# Patient Record
Sex: Female | Born: 1960 | Race: White | Hispanic: No | Smoking: Current every day smoker
Health system: Southern US, Community
[De-identification: ages and names within clinical notes are randomized; demographics above are authoritative.]

## PROBLEM LIST (undated history)

## (undated) DIAGNOSIS — E039 Hypothyroidism, unspecified: Secondary | ICD-10-CM

## (undated) DIAGNOSIS — A6000 Herpesviral infection of urogenital system, unspecified: Secondary | ICD-10-CM

## (undated) DIAGNOSIS — F32A Depression, unspecified: Secondary | ICD-10-CM

## (undated) DIAGNOSIS — M1990 Unspecified osteoarthritis, unspecified site: Secondary | ICD-10-CM

## (undated) DIAGNOSIS — F329 Major depressive disorder, single episode, unspecified: Secondary | ICD-10-CM

## (undated) DIAGNOSIS — Z8619 Personal history of other infectious and parasitic diseases: Secondary | ICD-10-CM

## (undated) DIAGNOSIS — F419 Anxiety disorder, unspecified: Secondary | ICD-10-CM

## (undated) DIAGNOSIS — F319 Bipolar disorder, unspecified: Secondary | ICD-10-CM

## (undated) DIAGNOSIS — M549 Dorsalgia, unspecified: Secondary | ICD-10-CM

## (undated) HISTORY — DX: Major depressive disorder, single episode, unspecified: F32.9

## (undated) HISTORY — DX: Personal history of other infectious and parasitic diseases: Z86.19

## (undated) HISTORY — DX: Herpesviral infection of urogenital system, unspecified: A60.00

## (undated) HISTORY — DX: Depression, unspecified: F32.A

## (undated) HISTORY — DX: Hypothyroidism, unspecified: E03.9

## (undated) HISTORY — DX: Anxiety disorder, unspecified: F41.9

## (undated) HISTORY — DX: Bipolar disorder, unspecified: F31.9

## (undated) HISTORY — DX: Unspecified osteoarthritis, unspecified site: M19.90

---

## 1979-03-24 HISTORY — PX: TONSILLECTOMY: SUR1361

## 1992-03-23 HISTORY — PX: TUBAL LIGATION: SHX77

## 1997-06-28 ENCOUNTER — Other Ambulatory Visit: Admission: RE | Admit: 1997-06-28 | Discharge: 1997-06-28 | Payer: Self-pay | Admitting: Family Medicine

## 1998-10-29 ENCOUNTER — Other Ambulatory Visit: Admission: RE | Admit: 1998-10-29 | Discharge: 1998-10-29 | Payer: Self-pay | Admitting: Family Medicine

## 1999-04-30 ENCOUNTER — Encounter: Admission: RE | Admit: 1999-04-30 | Discharge: 1999-04-30 | Payer: Self-pay | Admitting: Family Medicine

## 1999-04-30 ENCOUNTER — Encounter: Payer: Self-pay | Admitting: Family Medicine

## 1999-06-09 ENCOUNTER — Encounter: Admission: RE | Admit: 1999-06-09 | Discharge: 1999-06-09 | Payer: Self-pay | Admitting: Family Medicine

## 1999-06-09 ENCOUNTER — Encounter: Payer: Self-pay | Admitting: Family Medicine

## 2000-03-03 ENCOUNTER — Other Ambulatory Visit: Admission: RE | Admit: 2000-03-03 | Discharge: 2000-03-03 | Payer: Self-pay | Admitting: *Deleted

## 2000-09-08 ENCOUNTER — Ambulatory Visit (HOSPITAL_BASED_OUTPATIENT_CLINIC_OR_DEPARTMENT_OTHER): Admission: RE | Admit: 2000-09-08 | Discharge: 2000-09-08 | Payer: Self-pay | Admitting: Orthopedic Surgery

## 2000-11-15 ENCOUNTER — Other Ambulatory Visit: Admission: RE | Admit: 2000-11-15 | Discharge: 2000-11-15 | Payer: Self-pay | Admitting: *Deleted

## 2001-06-21 ENCOUNTER — Other Ambulatory Visit: Admission: RE | Admit: 2001-06-21 | Discharge: 2001-06-21 | Payer: Self-pay | Admitting: *Deleted

## 2002-03-23 HISTORY — PX: COLONOSCOPY: SHX174

## 2002-08-30 ENCOUNTER — Other Ambulatory Visit: Admission: RE | Admit: 2002-08-30 | Discharge: 2002-08-30 | Payer: Self-pay | Admitting: *Deleted

## 2002-10-04 ENCOUNTER — Encounter (INDEPENDENT_AMBULATORY_CARE_PROVIDER_SITE_OTHER): Payer: Self-pay

## 2002-10-04 ENCOUNTER — Ambulatory Visit (HOSPITAL_COMMUNITY): Admission: RE | Admit: 2002-10-04 | Discharge: 2002-10-04 | Payer: Self-pay | Admitting: *Deleted

## 2004-01-30 ENCOUNTER — Other Ambulatory Visit: Admission: RE | Admit: 2004-01-30 | Discharge: 2004-01-30 | Payer: Self-pay | Admitting: *Deleted

## 2004-03-23 HISTORY — PX: HEMORRHOID SURGERY: SHX153

## 2005-02-24 ENCOUNTER — Other Ambulatory Visit: Admission: RE | Admit: 2005-02-24 | Discharge: 2005-02-24 | Payer: Self-pay | Admitting: *Deleted

## 2006-02-25 ENCOUNTER — Other Ambulatory Visit: Admission: RE | Admit: 2006-02-25 | Discharge: 2006-02-25 | Payer: Self-pay | Admitting: *Deleted

## 2007-03-08 ENCOUNTER — Other Ambulatory Visit: Admission: RE | Admit: 2007-03-08 | Discharge: 2007-03-08 | Payer: Self-pay | Admitting: Internal Medicine

## 2007-04-07 ENCOUNTER — Encounter: Admission: RE | Admit: 2007-04-07 | Discharge: 2007-04-07 | Payer: Self-pay | Admitting: General Surgery

## 2007-04-11 ENCOUNTER — Encounter (INDEPENDENT_AMBULATORY_CARE_PROVIDER_SITE_OTHER): Payer: Self-pay | Admitting: General Surgery

## 2007-04-11 ENCOUNTER — Ambulatory Visit (HOSPITAL_BASED_OUTPATIENT_CLINIC_OR_DEPARTMENT_OTHER): Admission: RE | Admit: 2007-04-11 | Discharge: 2007-04-11 | Payer: Self-pay | Admitting: General Surgery

## 2007-11-11 ENCOUNTER — Emergency Department (HOSPITAL_COMMUNITY): Admission: EM | Admit: 2007-11-11 | Discharge: 2007-11-11 | Payer: Self-pay | Admitting: Emergency Medicine

## 2008-03-23 LAB — HM COLONOSCOPY

## 2008-06-12 ENCOUNTER — Other Ambulatory Visit: Admission: RE | Admit: 2008-06-12 | Discharge: 2008-06-12 | Payer: Self-pay | Admitting: Internal Medicine

## 2009-03-28 ENCOUNTER — Other Ambulatory Visit: Admission: RE | Admit: 2009-03-28 | Discharge: 2009-03-28 | Payer: Self-pay | Admitting: Obstetrics and Gynecology

## 2009-09-26 ENCOUNTER — Other Ambulatory Visit: Admission: RE | Admit: 2009-09-26 | Discharge: 2009-09-26 | Payer: Self-pay | Admitting: Obstetrics and Gynecology

## 2010-02-20 ENCOUNTER — Ambulatory Visit (HOSPITAL_COMMUNITY)
Admission: RE | Admit: 2010-02-20 | Discharge: 2010-02-20 | Payer: Self-pay | Source: Home / Self Care | Admitting: Gastroenterology

## 2010-03-23 LAB — HM MAMMOGRAPHY

## 2010-05-14 ENCOUNTER — Other Ambulatory Visit (HOSPITAL_COMMUNITY)
Admission: RE | Admit: 2010-05-14 | Discharge: 2010-05-14 | Disposition: A | Discharge status: Home / Self Care | Payer: 59 | Source: Ambulatory Visit | Attending: Obstetrics and Gynecology | Admitting: Obstetrics and Gynecology

## 2010-05-14 ENCOUNTER — Other Ambulatory Visit: Payer: Self-pay | Admitting: Obstetrics and Gynecology

## 2010-05-14 DIAGNOSIS — Z124 Encounter for screening for malignant neoplasm of cervix: Secondary | ICD-10-CM | POA: Insufficient documentation

## 2010-08-05 NOTE — Op Note (Signed)
**Note Lori via Obfuscation** Thomas, [name]              ACCOUNT NO.:  [id number]   MEDICAL RECORD NO.:  [id number]          PATIENT TYPE:  AMB   LOCATION:  DSC                          FACILITY:  [facility]   PHYSICIAN:  Cherylynn Ridges, M.D.    DATE OF BIRTH:  1960/12/12   DATE OF PROCEDURE:  04/11/2007  DATE OF DISCHARGE:                               OPERATIVE REPORT   PREOP DIAGNOSIS:  Internal and external hemorrhoids with possible  fissure.   POSTOP DIAGNOSIS:  Circumferential internal and external hemorrhoids.  No fissure.   PROCEDURES:  1. Rigid sigmoidoscopy.  2. Anorectal examination under anesthesia.  3. Internal and external hemorrhoidectomy x2.   SURGEON:  Dr. Lindie Spruce.   ANESTHESIA:  General with a laryngeal airway.   ESTIMATED BLOOD LOSS:  Less than 100 mL.   COMPLICATIONS:  None.   CONDITION:  Stable.   FINDINGS:  The patient had circumferential hemorrhoids, worse at the 7  o'clock and 4 o'clock positions.  Those were the ones removed.   INDICATIONS FOR OPERATION:  The patient is a 50 year old patient that I  have been treating for several months with office procedures for  internal and external hemorrhoids and now comes in for exam under  anesthesia and hemorrhoidectomy.   FINDINGS AT OPERATION:  The patient was taken to the operating room,  placed on the table in the supine position.  After an adequate general  laryngeal airway anesthetic was administered she was placed in the  lithotomy position and prepped and draped in the usual sterile manner  exposing the anorectal area.   Prior to the prep with Betadine the patient had a rigid sigmoidoscopy  done up to approximately 21 cm.  This demonstrated no anorectal polyps,  no sigmoid colon or rectal polyps, no strictures and no areas of  bleeding.  Once she was prepped and draped we went ahead did an exam  under anesthesia digitally and also with the anoscope.  The anal digital  exam did not demonstrate any actual scarring indicative  of fissures  either anterior or posteriorly.  With the anal speculum in place we  could see that the patient had hemorrhoidal disease, worse at the 4  o'clock and 7 o'clock position.  We initially took off the one at the  seven o'clock position with a basal stitch at the base of the hemorrhoid  of 3-0 chromic.  We excised the hemorrhoid using a 15 blade and then  established hemostasis with electrocautery. We then closed off the  mucosal edge using a running locking stitch of 3-0 chromic.   The hemorrhoid at the 4 o'clock position was removed in a similar  manner.  We did reinforce the hemorrhoidal suture line at the 4 o'clock  position with 12 o'clock being directly anterior using a figure-of-eight  stitch of 3-0 chromic.  No other hemorrhoids were removed, although she  did have significant hemorrhoidal disease at appropriately the 11  o'clock position.  We did not remove this at this time for fear of  causing significant strictures.  All hemorrhoids were excised external  to the sphincter.  You  could see the sphincter muscle left intact.   Once this was done we irrigated with saline.  We injected 10 mL of 0.25%  Marcaine with epi into the submucosal area around the area of the  hemorrhoids.  Then we placed a hole sheet of 3 x 5 Gelfoam with  Dibucaine ointment smeared on it into the anorectal area and a dressing  was applied.  All counts were correct including needles, sponges and  instruments.      Cherylynn Ridges, M.D.  Electronically Signed     JOW/MEDQ  D:  04/11/2007  T:  04/11/2007  Job:  [id number]   cc:   Theressa Millard, M.D.

## 2010-08-08 NOTE — Op Note (Signed)
[facility]  Patient:    [name], [name]                       MRN: [id number] Proc. Date: 09/08/00 Attending:  Artist Pais. Mina Marble, M.D.                           Operative Report  PREOPERATIVE DIAGNOSES:  Foreign body deep, left thumb.  POSTOPERATIVE DIAGNOSES:  Foreign body deep, left thumb.  PROCEDURE:  Excision of foreign (wooden splinter), left thumb.  SURGEON:  Artist Pais. Mina Marble, M.D.  ASSISTANT:  RN  ANESTHESIA:  General.  TOURNIQUET TIME:  10 minutes.  COMPLICATIONS:  None.  DRAINS:  None.  PROCEDURE:  Patient taken to the operating room after induction of adequate general anesthesia.  Left upper extremity was prepped and draped in the usual sterile fashion.  Esmarch was used to exsanguinate the limb.  Tourniquet was inflated to 250 mmHg at this time.  The left thumb was approached.  The nail plate was elevated off the nail bed along the ulnar border and one-third of the nail plate was elevated off the nail bed.  At this point in time incision was made in the nail bed.  Dissection was carried down to the area of the distal phalanx were a 2 mm x 8 mm wooden splinter was removed.  The wound was then thoroughly irrigated.  There was no evidence of further wooden splinters in this tract.  It was carefully irrigated and the wound was gently packed with Xeroform.  The patient was then placed in a sterile dressing of 4 x 4, Coban, and a volar splint.  Patient tolerated procedure well.  Went to recovery room in stable fashion. DD:  09/09/00 TD:  09/09/00 Job: [id number]

## 2010-12-11 LAB — DIFFERENTIAL
Eosinophils Relative: 3
Lymphocytes Relative: 38
Lymphs Abs: 2.6
Monocytes Absolute: 0.6
Neutro Abs: 3.5

## 2010-12-11 LAB — COMPREHENSIVE METABOLIC PANEL
Alkaline Phosphatase: 91
BUN: 10
Chloride: 109
Glucose, Bld: 77
Potassium: 4.1
Total Bilirubin: 0.5

## 2010-12-11 LAB — CBC
HCT: 36.5
Hemoglobin: 12.2
WBC: 6.8

## 2010-12-11 LAB — POCT HEMOGLOBIN-HEMACUE: Hemoglobin: 13.4

## 2011-01-12 ENCOUNTER — Emergency Department (HOSPITAL_COMMUNITY)
Admission: EM | Admit: 2011-01-12 | Discharge: 2011-01-14 | Disposition: A | Discharge status: Home / Self Care | Payer: Self-pay | Attending: Emergency Medicine | Admitting: Emergency Medicine

## 2011-01-12 DIAGNOSIS — F3130 Bipolar disorder, current episode depressed, mild or moderate severity, unspecified: Secondary | ICD-10-CM | POA: Insufficient documentation

## 2011-01-12 DIAGNOSIS — T43502A Poisoning by unspecified antipsychotics and neuroleptics, intentional self-harm, initial encounter: Secondary | ICD-10-CM | POA: Insufficient documentation

## 2011-01-12 DIAGNOSIS — T438X2A Poisoning by other psychotropic drugs, intentional self-harm, initial encounter: Secondary | ICD-10-CM | POA: Insufficient documentation

## 2011-01-12 DIAGNOSIS — T424X4A Poisoning by benzodiazepines, undetermined, initial encounter: Secondary | ICD-10-CM | POA: Insufficient documentation

## 2011-01-12 LAB — COMPREHENSIVE METABOLIC PANEL
ALT: 9 U/L (ref 0–35)
AST: 14 U/L (ref 0–37)
Albumin: 3.7 g/dL (ref 3.5–5.2)
Alkaline Phosphatase: 122 U/L — ABNORMAL HIGH (ref 39–117)
BUN: 20 mg/dL (ref 6–23)
Chloride: 103 mEq/L (ref 96–112)
Potassium: 3.4 mEq/L — ABNORMAL LOW (ref 3.5–5.1)
Sodium: 137 mEq/L (ref 135–145)
Total Bilirubin: 0.2 mg/dL — ABNORMAL LOW (ref 0.3–1.2)
Total Protein: 7.2 g/dL (ref 6.0–8.3)

## 2011-01-12 LAB — RAPID URINE DRUG SCREEN, HOSP PERFORMED
Amphetamines: NOT DETECTED
Barbiturates: NOT DETECTED
Benzodiazepines: POSITIVE — AB
Cocaine: NOT DETECTED
Opiates: NOT DETECTED
Tetrahydrocannabinol: NOT DETECTED

## 2011-01-12 LAB — CBC
MCV: 89.1 fL (ref 78.0–100.0)
Platelets: 285 10*3/uL (ref 150–400)
RBC: 4.57 MIL/uL (ref 3.87–5.11)
RDW: 13 % (ref 11.5–15.5)
WBC: 7.8 10*3/uL (ref 4.0–10.5)

## 2011-01-12 LAB — DIFFERENTIAL
Basophils Absolute: 0 10*3/uL (ref 0.0–0.1)
Eosinophils Absolute: 0.2 10*3/uL (ref 0.0–0.7)
Eosinophils Relative: 3 % (ref 0–5)
Lymphocytes Relative: 27 % (ref 12–46)
Lymphs Abs: 2.1 10*3/uL (ref 0.7–4.0)
Neutrophils Relative %: 62 % (ref 43–77)

## 2011-01-12 LAB — URINALYSIS, ROUTINE W REFLEX MICROSCOPIC
Bilirubin Urine: NEGATIVE
Ketones, ur: NEGATIVE mg/dL
Nitrite: NEGATIVE
Specific Gravity, Urine: 1.019 (ref 1.005–1.030)
Urobilinogen, UA: 0.2 mg/dL (ref 0.0–1.0)

## 2011-01-12 LAB — URINE MICROSCOPIC-ADD ON

## 2011-01-12 LAB — GLUCOSE, CAPILLARY

## 2011-01-12 LAB — ACETAMINOPHEN LEVEL: Acetaminophen (Tylenol), Serum: <15 ug/mL (ref 10–30)

## 2012-06-29 ENCOUNTER — Encounter: Payer: Self-pay | Admitting: Internal Medicine

## 2012-06-29 ENCOUNTER — Other Ambulatory Visit (INDEPENDENT_AMBULATORY_CARE_PROVIDER_SITE_OTHER): Payer: BC Managed Care – PPO

## 2012-06-29 ENCOUNTER — Ambulatory Visit (INDEPENDENT_AMBULATORY_CARE_PROVIDER_SITE_OTHER): Payer: BC Managed Care – PPO | Admitting: Internal Medicine

## 2012-06-29 VITALS — BP 90/62 | HR 73 | Temp 98.0°F | Ht 64.0 in | Wt 162.5 lb

## 2012-06-29 DIAGNOSIS — Z0000 Encounter for general adult medical examination without abnormal findings: Secondary | ICD-10-CM

## 2012-06-29 DIAGNOSIS — T782XXS Anaphylactic shock, unspecified, sequela: Secondary | ICD-10-CM

## 2012-06-29 DIAGNOSIS — Z13220 Encounter for screening for lipoid disorders: Secondary | ICD-10-CM

## 2012-06-29 DIAGNOSIS — Z131 Encounter for screening for diabetes mellitus: Secondary | ICD-10-CM

## 2012-06-29 DIAGNOSIS — Z1329 Encounter for screening for other suspected endocrine disorder: Secondary | ICD-10-CM

## 2012-06-29 DIAGNOSIS — Z130 Encounter for screening for diseases of the blood and blood-forming organs and certain disorders involving the immune mechanism: Secondary | ICD-10-CM

## 2012-06-29 DIAGNOSIS — T788XXS Other adverse effects, not elsewhere classified, sequela: Secondary | ICD-10-CM

## 2012-06-29 DIAGNOSIS — Z1239 Encounter for other screening for malignant neoplasm of breast: Secondary | ICD-10-CM

## 2012-06-29 DIAGNOSIS — A6000 Herpesviral infection of urogenital system, unspecified: Secondary | ICD-10-CM

## 2012-06-29 DIAGNOSIS — Z23 Encounter for immunization: Secondary | ICD-10-CM

## 2012-06-29 LAB — CBC
HCT: 37.8 % (ref 36.0–46.0)
Hemoglobin: 12.9 g/dL (ref 12.0–15.0)
MCHC: 34.2 g/dL (ref 30.0–36.0)
RDW: 13.3 % (ref 11.5–14.6)
WBC: 5.9 10*3/uL (ref 4.5–10.5)

## 2012-06-29 LAB — BASIC METABOLIC PANEL
Calcium: 9.5 mg/dL (ref 8.4–10.5)
Chloride: 107 mEq/L (ref 96–112)
Creatinine, Ser: 0.9 mg/dL (ref 0.4–1.2)
Sodium: 140 mEq/L (ref 135–145)

## 2012-06-29 LAB — LIPID PANEL
Cholesterol: 254 mg/dL — ABNORMAL HIGH (ref 0–200)
HDL: 28.4 mg/dL — ABNORMAL LOW (ref >39.00)
Triglycerides: 199 mg/dL — ABNORMAL HIGH (ref 0.0–149.0)

## 2012-06-29 MED ORDER — EPINEPHRINE 0.3 MG/0.3ML IJ DEVI: 0.3000 mg | Freq: Once | INTRAMUSCULAR | Status: DC

## 2012-06-29 MED ORDER — ACYCLOVIR 200 MG PO CAPS: 200.0000 mg | ORAL_CAPSULE | Freq: Every day | ORAL | Status: DC

## 2012-06-29 NOTE — Patient Instructions (Signed)

## 2012-06-29 NOTE — Addendum Note (Signed)
Addended by: Brenton Grills C on: 06/29/2012 11:18 AM   Modules accepted: Orders

## 2012-06-29 NOTE — Progress Notes (Signed)
HPI  Pt presents to the clinic today to establish care. She has not been seen by a PCP in over 2 years. She was being seen at [facility] physicians. She does need some refills today. Other than that, she has no concerns.  Flu: 12/2011 Tetanus: more than 10 years Pap: 2012 Mammogram: 2012 LMP: post menopausal Eye doctor: yearly Dentist: yearly  Past Medical History  Diagnosis Date  . Bipolar affective disorder   . Depression   . Arthritis   . Hypothyroidism   . Genital herpes   . History of chicken pox     Current Outpatient Prescriptions  Medication Sig Dispense Refill  . ALPRAZolam (XANAX) 1 MG tablet 1-2 tablets as needed three times daily      . diazepam (VALIUM) 10 MG tablet Take 10 mg by mouth every 6 (six) hours as needed for anxiety.      . Multiple Vitamins-Minerals (ADULT GUMMY PO) Take 1 tablet by mouth 2 (two) times daily.      . phentermine (ADIPEX-P) 37.5 MG tablet Take 37.5 mg by mouth daily before breakfast.      . topiramate (TOPAMAX) 200 MG tablet Take 200 mg by mouth daily.       No current facility-administered medications for this visit.    Allergies  Allergen Reactions  . Bee Venom     Family History  Problem Relation Age of Onset  . Arthritis Mother   . Cancer Neg Hx   . Diabetes Neg Hx   . Stroke Neg Hx     History   Social History  . Marital Status: Divorced    Spouse Name: N/A    Number of Children: 1  . Years of Education: 12   Occupational History  . Retired    Social History Main Topics  . Smoking status: Current Every Day Smoker -- 0.50 packs/day for 33 years  . Smokeless tobacco: Never Used  . Alcohol Use: Yes  . Drug Use: No  . Sexually Active: Yes   Other Topics Concern  . Not on file   Social History Narrative   Regular exercise-no   Caffeine Use-yes    ROS:  Constitutional: Denies fever, malaise, fatigue, headache or abrupt weight changes.  HEENT: Pt reports blurred vision. Denies eye pain, eye redness, ear pain,  ringing in the ears, wax buildup, runny nose, nasal congestion, bloody nose, or sore throat. Respiratory: Denies difficulty breathing, shortness of breath, cough or sputum production.   Cardiovascular: Denies chest pain, chest tightness, palpitations or swelling in the hands or feet.  Gastrointestinal: Pt reports constipation. Denies abdominal pain, bloating, diarrhea or blood in the stool.  GU: Denies frequency, urgency, pain with urination, blood in urine, odor or discharge. Musculoskeletal: Denies decrease in range of motion, difficulty with gait, muscle pain or joint pain and swelling.  Skin: Denies redness, rashes, lesions or ulcercations.  Neurological: Denies dizziness, difficulty with memory, difficulty with speech or problems with balance and coordination.   No other specific complaints in a complete review of systems (except as listed in HPI above).  PE:  BP 90/62  Pulse 73  Temp(Src) 98 F (36.7 C) (Oral)  Ht 5\' 4"  (1.626 m)  Wt 162 lb 8 oz (73.71 kg)  BMI 27.88 kg/m2  SpO2 97% Wt Readings from Last 3 Encounters:  06/29/12 162 lb 8 oz (73.71 kg)    General: Appears her stated age, well developed, well nourished in NAD. HEENT: Head: normal shape and size; Eyes: sclera white,  no icterus, conjunctiva pink, PERRLA and EOMs intact; Ears: Tm's gray and intact, normal light reflex; Nose: mucosa pink and moist, septum midline; Throat/Mouth: Teeth present, mucosa pink and moist, no lesions or ulcerations noted.  Neck: Normal range of motion. Neck supple, trachea midline. No massses, lumps or thyromegaly present.  Cardiovascular: Normal rate and rhythm. S1,S2 noted.  No murmur, rubs or gallops noted. No JVD or BLE edema. No carotid bruits noted. Pulmonary/Chest: Normal effort and positive vesicular breath sounds. No respiratory distress. No wheezes, rales or ronchi noted.  Abdomen: Soft and nontender. Normal bowel sounds, no bruits noted. No distention or masses noted. Liver, spleen  and kidneys non palpable. Musculoskeletal: Normal range of motion. No signs of joint swelling. No difficulty with gait.  Neurological: Alert and oriented. Cranial nerves II-XII intact. Coordination normal. +DTRs bilaterally. Psychiatric: Mood and affect normal. Behavior is normal. Judgment and thought content normal.      Assessment and Plan:  Preventative Health Maintenance:  Start a diet and exercise program Smoking Cessation counseling given approx 5 minutes, materials given Tdap given today Will obtain basic screening labs Will place order for mammogram Pt will set up pap smear with me RX for epi pen

## 2012-06-29 NOTE — Assessment & Plan Note (Signed)
Will restart acyclovir today

## 2012-06-30 ENCOUNTER — Other Ambulatory Visit: Payer: Self-pay | Admitting: Internal Medicine

## 2012-06-30 DIAGNOSIS — E785 Hyperlipidemia, unspecified: Secondary | ICD-10-CM

## 2012-06-30 MED ORDER — SIMVASTATIN 10 MG PO TABS: 10.0000 mg | ORAL_TABLET | Freq: Every day | ORAL | Status: DC

## 2012-07-04 ENCOUNTER — Encounter: Payer: Self-pay | Admitting: *Deleted

## 2012-07-15 ENCOUNTER — Other Ambulatory Visit (HOSPITAL_COMMUNITY)
Admission: RE | Admit: 2012-07-15 | Discharge: 2012-07-15 | Disposition: A | Discharge status: Home / Self Care | Payer: BC Managed Care – PPO | Source: Ambulatory Visit | Attending: Internal Medicine | Admitting: Internal Medicine

## 2012-07-15 ENCOUNTER — Encounter: Payer: Self-pay | Admitting: Internal Medicine

## 2012-07-15 ENCOUNTER — Ambulatory Visit (INDEPENDENT_AMBULATORY_CARE_PROVIDER_SITE_OTHER): Payer: BC Managed Care – PPO | Admitting: Internal Medicine

## 2012-07-15 VITALS — BP 98/68 | HR 94 | Temp 96.9°F | Wt 157.6 lb

## 2012-07-15 DIAGNOSIS — Z124 Encounter for screening for malignant neoplasm of cervix: Secondary | ICD-10-CM

## 2012-07-15 DIAGNOSIS — Z01419 Encounter for gynecological examination (general) (routine) without abnormal findings: Secondary | ICD-10-CM

## 2012-07-15 NOTE — Progress Notes (Signed)
Subjective:    Patient ID: [name], female    DOB: Nov 19, 1960, 52 y.o.   MRN: [id number]  HPI  Pt presents to the clinic today for her pap smear. She has no concerns today.  Review of Systems      Past Medical History  Diagnosis Date  . Bipolar affective disorder   . Depression   . Arthritis   . Hypothyroidism   . Genital herpes   . History of chicken pox     Current Outpatient Prescriptions  Medication Sig Dispense Refill  . acyclovir (ZOVIRAX) 200 MG capsule Take 1 capsule (200 mg total) by mouth daily.  30 capsule  2  . ALPRAZolam (XANAX) 1 MG tablet 1-2 tablets as needed three times daily      . diazepam (VALIUM) 10 MG tablet Take 10 mg by mouth every 6 (six) hours as needed for anxiety.      Marland Kitchen EPINEPHrine (EPI-PEN) 0.3 mg/0.3 mL DEVI Inject 0.3 mLs (0.3 mg total) into the muscle once.  1 Device  2  . Multiple Vitamins-Minerals (ADULT GUMMY PO) Take 1 tablet by mouth 2 (two) times daily.      . phentermine (ADIPEX-P) 37.5 MG tablet Take 37.5 mg by mouth daily before breakfast.      . simvastatin (ZOCOR) 10 MG tablet Take 1 tablet (10 mg total) by mouth at bedtime.  30 tablet  2  . topiramate (TOPAMAX) 200 MG tablet Take 200 mg by mouth daily.       No current facility-administered medications for this visit.    Allergies  Allergen Reactions  . Bee Venom     Family History  Problem Relation Age of Onset  . Arthritis Mother   . Cancer Neg Hx   . Diabetes Neg Hx   . Stroke Neg Hx     History   Social History  . Marital Status: Divorced    Spouse Name: N/A    Number of Children: 1  . Years of Education: 12   Occupational History  . Retired    Social History Main Topics  . Smoking status: Current Every Day Smoker -- 0.50 packs/day for 33 years  . Smokeless tobacco: Never Used  . Alcohol Use: Yes  . Drug Use: No  . Sexually Active: Yes   Other Topics Concern  . Not on file   Social History Narrative   Regular exercise-no   Caffeine  Use-yes     Constitutional: Denies fever, malaise, fatigue, headache or abrupt weight changes.   GU: Denies urgency, frequency, pain with urination, burning sensation, blood in urine, odor or discharge. She denies abnormal bleeding, pain with intercourse or vaginal discharge or odor.    No other specific complaints in a complete review of systems (except as listed in HPI above).  Objective:   Physical Exam  Constitutional:  Alert, oriented x 4, well developed, well nourished in no apparent distress.  Cardiovascular: Normal rate and rhythm. S1,S2 noted.  No murmur, rubs or gallops noted. No JVD or BLE edema. No carotid bruits noted. Pulmonary/Chest: Normal effort and positive vesicular breath sounds. No respiratory distress. No wheezes, rales or ronchi noted.  Genitourinary: Normal female anatomy. Uterus midline, anterior and soft. No CMT or discharge noted. Adenexa non palpable. Some tenderness noted in the left adenexal region. Breast without lumps or masses.       Assessment & Plan:   Screening for cervical cancer with routine gyn exam:  Pap smear obtained- will send off  for cytology Bimanual exam performed, no abnormal findings  RTC in 5 years for repeat pap smear

## 2012-07-15 NOTE — Patient Instructions (Signed)

## 2012-07-21 ENCOUNTER — Telehealth: Payer: Self-pay

## 2012-07-21 NOTE — Telephone Encounter (Signed)
Phone call from pt requesting her pap results. Please advise.

## 2012-07-21 NOTE — Telephone Encounter (Signed)
Her pap was normal. We can repeat in 3 years [address]

## 2012-07-21 NOTE — Telephone Encounter (Signed)
Notified pt with regina response.../lmb 

## 2012-08-16 ENCOUNTER — Encounter: Payer: Self-pay | Admitting: Internal Medicine

## 2012-08-26 ENCOUNTER — Encounter: Payer: Self-pay | Admitting: Internal Medicine

## 2012-10-12 ENCOUNTER — Telehealth: Payer: Self-pay | Admitting: *Deleted

## 2012-10-12 DIAGNOSIS — A6000 Herpesviral infection of urogenital system, unspecified: Secondary | ICD-10-CM

## 2012-10-12 MED ORDER — VARENICLINE TARTRATE 0.5 MG PO TABS: 0.5000 mg | ORAL_TABLET | Freq: Two times a day (BID) | ORAL | Status: DC

## 2012-10-12 MED ORDER — ACYCLOVIR 200 MG PO CAPS: 200.0000 mg | ORAL_CAPSULE | Freq: Every day | ORAL | Status: DC

## 2012-10-12 NOTE — Telephone Encounter (Signed)
Spoke with pt advised rx ready for pick up.

## 2012-10-12 NOTE — Telephone Encounter (Signed)
Ok to refill valtrex and RX for chantix starter kit

## 2012-10-12 NOTE — Telephone Encounter (Signed)
Pt called requesting Acyclovir refill and an Rx for Chantix.  Please advise

## 2013-01-25 ENCOUNTER — Other Ambulatory Visit: Payer: Self-pay | Admitting: Internal Medicine

## 2013-01-26 ENCOUNTER — Other Ambulatory Visit: Payer: Self-pay

## 2013-04-19 ENCOUNTER — Other Ambulatory Visit: Payer: Self-pay | Admitting: Internal Medicine

## 2013-06-15 ENCOUNTER — Other Ambulatory Visit (INDEPENDENT_AMBULATORY_CARE_PROVIDER_SITE_OTHER): Payer: BC Managed Care – PPO

## 2013-06-15 ENCOUNTER — Ambulatory Visit (INDEPENDENT_AMBULATORY_CARE_PROVIDER_SITE_OTHER): Payer: BC Managed Care – PPO | Admitting: Physician Assistant

## 2013-06-15 ENCOUNTER — Encounter: Payer: Self-pay | Admitting: Physician Assistant

## 2013-06-15 VITALS — BP 120/70 | HR 92 | Temp 98.5°F | Ht 65.0 in | Wt 167.2 lb

## 2013-06-15 DIAGNOSIS — F17200 Nicotine dependence, unspecified, uncomplicated: Secondary | ICD-10-CM

## 2013-06-15 DIAGNOSIS — A6000 Herpesviral infection of urogenital system, unspecified: Secondary | ICD-10-CM

## 2013-06-15 DIAGNOSIS — F319 Bipolar disorder, unspecified: Secondary | ICD-10-CM

## 2013-06-15 DIAGNOSIS — Z0000 Encounter for general adult medical examination without abnormal findings: Secondary | ICD-10-CM

## 2013-06-15 LAB — CBC WITH DIFFERENTIAL/PLATELET
BASOS PCT: 0.1 % (ref 0.0–3.0)
Basophils Absolute: 0 10*3/uL (ref 0.0–0.1)
EOS PCT: 2 % (ref 0.0–5.0)
Eosinophils Absolute: 0.2 10*3/uL (ref 0.0–0.7)
HCT: 33.8 % — ABNORMAL LOW (ref 36.0–46.0)
Hemoglobin: 11.4 g/dL — ABNORMAL LOW (ref 12.0–15.0)
Lymphocytes Relative: 24.5 % (ref 12.0–46.0)
Lymphs Abs: 2 10*3/uL (ref 0.7–4.0)
MCHC: 33.9 g/dL (ref 30.0–36.0)
MCV: 96.3 fl (ref 78.0–100.0)
MONO ABS: 0.8 10*3/uL (ref 0.1–1.0)
Monocytes Relative: 10.4 % (ref 3.0–12.0)
Neutro Abs: 5 10*3/uL (ref 1.4–7.7)
Neutrophils Relative %: 63 % (ref 43.0–77.0)
Platelets: 208 10*3/uL (ref 150.0–400.0)
RBC: 3.51 Mil/uL — AB (ref 3.87–5.11)
RDW: 13.6 % (ref 11.5–14.6)
WBC: 8 10*3/uL (ref 4.5–10.5)

## 2013-06-15 LAB — LIPID PANEL
Cholesterol: 214 mg/dL — ABNORMAL HIGH (ref 0–200)
HDL: 39.1 mg/dL (ref >39.00)
LDL Cholesterol: 133 mg/dL — ABNORMAL HIGH (ref 0–99)
TRIGLYCERIDES: 212 mg/dL — AB (ref 0.0–149.0)
Total CHOL/HDL Ratio: 5
VLDL: 42.4 mg/dL — ABNORMAL HIGH (ref 0.0–40.0)

## 2013-06-15 LAB — HEPATIC FUNCTION PANEL
ALT: 11 U/L (ref 0–35)
AST: 15 U/L (ref 0–37)
Albumin: 3.5 g/dL (ref 3.5–5.2)
Alkaline Phosphatase: 53 U/L (ref 39–117)
BILIRUBIN TOTAL: 0.3 mg/dL (ref 0.3–1.2)
Bilirubin, Direct: 0 mg/dL (ref 0.0–0.3)
Total Protein: 6.1 g/dL (ref 6.0–8.3)

## 2013-06-15 LAB — BASIC METABOLIC PANEL
BUN: 13 mg/dL (ref 6–23)
CALCIUM: 9.2 mg/dL (ref 8.4–10.5)
CO2: 29 meq/L (ref 19–32)
Chloride: 104 mEq/L (ref 96–112)
Creatinine, Ser: 0.8 mg/dL (ref 0.4–1.2)
GFR: 85.46 mL/min (ref >60.00)
GLUCOSE: 93 mg/dL (ref 70–99)
POTASSIUM: 3.8 meq/L (ref 3.5–5.1)
Sodium: 140 mEq/L (ref 135–145)

## 2013-06-15 LAB — URINALYSIS, ROUTINE W REFLEX MICROSCOPIC
Bilirubin Urine: NEGATIVE
Ketones, ur: NEGATIVE
Nitrite: NEGATIVE
SPECIFIC GRAVITY, URINE: 1.01 (ref 1.000–1.030)
Total Protein, Urine: NEGATIVE
URINE GLUCOSE: NEGATIVE
Urobilinogen, UA: 0.2 (ref 0.0–1.0)
pH: 7 (ref 5.0–8.0)

## 2013-06-15 LAB — TSH: TSH: 9.8 u[IU]/mL — ABNORMAL HIGH (ref 0.35–5.50)

## 2013-06-15 NOTE — Progress Notes (Signed)
Pre visit review using our clinic review tool, if applicable. No additional management support is needed unless otherwise documented below in the visit note. 

## 2013-06-15 NOTE — Patient Instructions (Addendum)
It was great to meet you today [name]!   I have placed a referral to the gynecologist as you requested. The patient care coordinator will contact you to help schedule this.  Health Maintenance, Female A healthy lifestyle and preventative care can promote health and wellness.  Maintain regular health, dental, and eye exams.  Eat a healthy diet. Foods like vegetables, fruits, whole grains, low-fat dairy products, and lean protein foods contain the nutrients you need without too many calories. Decrease your intake of foods high in solid fats, added sugars, and salt. Get information about a proper diet from your caregiver, if necessary.  Regular physical exercise is one of the most important things you can do for your health. Most adults should get at least 150 minutes of moderate-intensity exercise (any activity that increases your heart rate and causes you to sweat) each week. In addition, most adults need muscle-strengthening exercises on 2 or more days a week.   Maintain a healthy weight. The body mass index (BMI) is a screening tool to identify possible weight problems. It provides an estimate of body fat based on height and weight. Your caregiver can help determine your BMI, and can help you achieve or maintain a healthy weight. For adults 20 years and older:  A BMI below 18.5 is considered underweight.  A BMI of 18.5 to 24.9 is normal.  A BMI of 25 to 29.9 is considered overweight.  A BMI of 30 and above is considered obese.  Maintain normal blood lipids and cholesterol by exercising and minimizing your intake of saturated fat. Eat a balanced diet with plenty of fruits and vegetables. Blood tests for lipids and cholesterol should begin at age 30 and be repeated every 5 years. If your lipid or cholesterol levels are high, you are over 50, or you are a high risk for heart disease, you may need your cholesterol levels checked more frequently.Ongoing high lipid and cholesterol levels  should be treated with medicines if diet and exercise are not effective.  If you smoke, find out from your caregiver how to quit. If you do not use tobacco, do not start.  Lung cancer screening is recommended for adults aged 73 80 years who are at high risk for developing lung cancer because of a history of smoking. Yearly low-dose computed tomography (CT) is recommended for people who have at least a 30-pack-year history of smoking and are a current smoker or have quit within the past 15 years. A pack year of smoking is smoking an average of 1 pack of cigarettes a day for 1 year (for example: 1 pack a day for 30 years or 2 packs a day for 15 years). Yearly screening should continue until the smoker has stopped smoking for at least 15 years. Yearly screening should also be stopped for people who develop a health problem that would prevent them from having lung cancer treatment.  If you are pregnant, do not drink alcohol. If you are breastfeeding, be very cautious about drinking alcohol. If you are not pregnant and choose to drink alcohol, do not exceed 1 drink per day. One drink is considered to be 12 ounces (355 mL) of beer, 5 ounces (148 mL) of wine, or 1.5 ounces (44 mL) of liquor.  Avoid use of street drugs. Do not share needles with anyone. Ask for help if you need support or instructions about stopping the use of drugs.  High blood pressure causes heart disease and increases the risk of stroke. Blood  pressure should be checked at least every 1 to 2 years. Ongoing high blood pressure should be treated with medicines, if weight loss and exercise are not effective.  If you are 26 to 55 years old, ask your caregiver if you should take aspirin to prevent strokes.  Diabetes screening involves taking a blood sample to check your fasting blood sugar level. This should be done once every 3 years, after age 49, if you are within normal weight and without risk factors for diabetes. Testing should be  considered at a younger age or be carried out more frequently if you are overweight and have at least 1 risk factor for diabetes.  Breast cancer screening is essential preventative care for women. You should practice "breast self-awareness." This means understanding the normal appearance and feel of your breasts and may include breast self-examination. Any changes detected, no matter how small, should be reported to a caregiver. Women in their 14s and 30s should have a clinical breast exam (CBE) by a caregiver as part of a regular health exam every 1 to 3 years. After age 4, women should have a CBE every year. Starting at age 4, women should consider having a mammogram (breast X-ray) every year. Women who have a family history of breast cancer should talk to their caregiver about genetic screening. Women at a high risk of breast cancer should talk to their caregiver about having an MRI and a mammogram every year.  Breast cancer gene (BRCA)-related cancer risk assessment is recommended for women who have family members with BRCA-related cancers. BRCA-related cancers include breast, ovarian, tubal, and peritoneal cancers. Having family members with these cancers may be associated with an increased risk for harmful changes (mutations) in the breast cancer genes BRCA1 and BRCA2. Results of the assessment will determine the need for genetic counseling and BRCA1 and BRCA2 testing.  The Pap test is a screening test for cervical cancer. Women should have a Pap test starting at age 71. Between ages 82 and 15, Pap tests should be repeated every 2 years. Beginning at age 34, you should have a Pap test every 3 years as long as the past 3 Pap tests have been normal. If you had a hysterectomy for a problem that was not cancer or a condition that could lead to cancer, then you no longer need Pap tests. If you are between ages 38 and 23, and you have had normal Pap tests going back 10 years, you no longer need Pap tests. If  you have had past treatment for cervical cancer or a condition that could lead to cancer, you need Pap tests and screening for cancer for at least 20 years after your treatment. If Pap tests have been discontinued, risk factors (such as a new sexual partner) need to be reassessed to determine if screening should be resumed. Some women have medical problems that increase the chance of getting cervical cancer. In these cases, your caregiver may recommend more frequent screening and Pap tests.  The human papillomavirus (HPV) test is an additional test that may be used for cervical cancer screening. The HPV test looks for the virus that can cause the cell changes on the cervix. The cells collected during the Pap test can be tested for HPV. The HPV test could be used to screen women aged 3 years and older, and should be used in women of any age who have unclear Pap test results. After the age of 92, women should have HPV testing at the  same frequency as a Pap test.  Colorectal cancer can be detected and often prevented. Most routine colorectal cancer screening begins at the age of 14 and continues through age 51. However, your caregiver may recommend screening at an earlier age if you have risk factors for colon cancer. On a yearly basis, your caregiver may provide home test kits to check for hidden blood in the stool. Use of a small camera at the end of a tube, to directly examine the colon (sigmoidoscopy or colonoscopy), can detect the earliest forms of colorectal cancer. Talk to your caregiver about this at age 11, when routine screening begins. Direct examination of the colon should be repeated every 5 to 10 years through age 62, unless early forms of pre-cancerous polyps or small growths are found.  Hepatitis C blood testing is recommended for all people born from [birth year] through 1965 and any individual with known risks for hepatitis C.  Practice safe sex. Use condoms and avoid high-risk sexual practices to  reduce the spread of sexually transmitted infections (STIs). Sexually active women aged 37 and younger should be checked for Chlamydia, which is a common sexually transmitted infection. Older women with new or multiple partners should also be tested for Chlamydia. Testing for other STIs is recommended if you are sexually active and at increased risk.  Osteoporosis is a disease in which the bones lose minerals and strength with aging. This can result in serious bone fractures. The risk of osteoporosis can be identified using a bone density scan. Women ages 41 and over and women at risk for fractures or osteoporosis should discuss screening with their caregivers. Ask your caregiver whether you should be taking a calcium supplement or vitamin D to reduce the rate of osteoporosis.  Menopause can be associated with physical symptoms and risks. Hormone replacement therapy is available to decrease symptoms and risks. You should talk to your caregiver about whether hormone replacement therapy is right for you.  Use sunscreen. Apply sunscreen liberally and repeatedly throughout the day. You should seek shade when your shadow is shorter than you. Protect yourself by wearing long sleeves, pants, a wide-brimmed hat, and sunglasses year round, whenever you are outdoors.  Notify your caregiver of new moles or changes in moles, especially if there is a change in shape or color. Also notify your caregiver if a mole is larger than the size of a pencil eraser.  Stay current with your immunizations. Document Released: 09/22/2010 Document Revised: 07/04/2012 Document Reviewed: 09/22/2010 [facility] Patient Information 2014 [employer].

## 2013-06-15 NOTE — Progress Notes (Signed)
Patient ID: [name] is a 53 y.o. female DOB: [id number] MRN: [id number]     HPI:  Patient is a 53 year old female here for yearly exam. Reports no current concerns. 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 History of Genital Herpes prophylactic treatment with Zovirax 200 mg once daily. Patient reports is bipolar, follows with Dr. Toy Care who prescribes her Xanax. Has had high cholesterol in the past not currently taking medications. Reports was on thyroid medication in the past however was taken off by previous provider, would like her levels check, states she has felt tired recently and had a weight gain over last couple months. Has had multiple attempts to quit smoking, used Chantix most recently with no success. Not interested in trying now.Would like referral to GYN. States has had abnormal PAP's in the past.  Influenza: not this year Pneumonia: does not want Tetanus: 4/14 PAP: 4/14 LMP: tubiligation Mammogram: 6/14 Eye Dr. In last 8 months Dentist 1/15 Colonoscopy: 1/10   ROS: As stated in HPI. All other systems negative  Past Medical History  Diagnosis Date  . Bipolar affective disorder   . Depression   . Arthritis   . Hypothyroidism   . Genital herpes   . History of chicken pox    Family History  Problem Relation Age of Onset  . Arthritis Mother   . Cancer Neg Hx   . Diabetes Neg Hx   . Stroke Neg Hx    History   Social History  . Marital Status: Divorced    Spouse Name: N/A    Number of Children: 1  . Years of Education: 12   Occupational History  . Retired    Social History Main Topics  . Smoking status: Current Every Day Smoker -- 0.50 packs/day for 33 years  . Smokeless tobacco: Never Used  . Alcohol Use: Yes  . Drug Use: No  . Sexual Activity: Yes   Other Topics Concern  . None   Social History Narrative   Regular exercise-no   Caffeine Use-yes   Past Surgical History    Procedure Laterality Date  . Tonsillectomy  1981  . Hemorrhoid surgery  2006  . Cesarean section  1993  . Tubal ligation  1994   Current Outpatient Prescriptions on File Prior to Visit  Medication Sig Dispense Refill  . acyclovir (ZOVIRAX) 200 MG capsule TAKE ONE CAPSULE EVERY DAY  30 capsule  2  . ALPRAZolam (XANAX) 1 MG tablet 1-2 tablets as needed three times daily      . EPINEPHrine (EPI-PEN) 0.3 mg/0.3 mL DEVI Inject 0.3 mLs (0.3 mg total) into the muscle once.  1 Device  2  . Multiple Vitamins-Minerals (ADULT GUMMY PO) Take 1 tablet by mouth 2 (two) times daily.       No current facility-administered medications on file prior to visit.   Allergies  Allergen Reactions  . Bee Venom     PE:  Filed Vitals:   06/15/13 0903  BP: 120/70  Pulse: 92  Temp: 98.5 F (36.9 C)    CONSTITUTIONAL: Well developed, well nourished, pleasant, appears stated age, in NAD HEENT: normocephalic, atraumatic, bilateral ext/int canals normal. Bilateral TM's without injections, bulging, erythema. Nose normal, uvula midline, oropharynx clear and moist. Dentition fair. EYES: PERRLA, bilateral EOM and conjunctiva normal without icterus NECK: FROM, supple, without thyromegaly or mass, trachea midline CARDIO: RRR, normal S1 and S2, distal pulses intact.  No extremity edema PULM/CHEST CTA bilateral, no wheezes, rales or rhonchi. Non tender. ABD: appearance normal, soft, nontender. Normal bowel sounds x 4 quadrants, no HSM GU: deferred to GYN.  MUSC: FROM U/LE bilateral, FROM thoracic and lumbar spine, no midline tenderness.  LYMPH: no cervical, supraclavicular adenopathy NEURO: alert and oriented x 3, no cranial nerve deficit, motor strength and coordination NL. DTR's intact. Negative romberg. Gait normal. SKIN: warm, dry, no rash or lesions noted. PSYCH: Mood and affect normal, speech normal. Thought content and judgement normal.   Lab Results  Component Value Date   WBC 5.9 06/29/2012   HGB 12.9  06/29/2012   HCT 37.8 06/29/2012   PLT 267.0 06/29/2012   GLUCOSE 88 06/29/2012   CHOL 254* 06/29/2012   TRIG 199.0* 06/29/2012   HDL 28.40* 06/29/2012   LDLDIRECT 181.5 06/29/2012   ALT 9 01/12/2011   AST 14 01/12/2011   NA 140 06/29/2012   K 3.4* 06/29/2012   CL 107 06/29/2012   CREATININE 0.9 06/29/2012   BUN 9 06/29/2012   CO2 25 06/29/2012   TSH 2.30 06/29/2012   HGBA1C 4.9 06/29/2012   Wt Readings from Last 3 Encounters:  06/15/13 167 lb 3.2 oz (75.841 kg)  07/15/12 157 lb 9.6 oz (71.487 kg)  06/29/12 162 lb 8 oz (73.71 kg)     ASSESSMENT and PLAN   CPX/v70.0 - Patient has been counseled on age-appropriate routine health concerns for screening and prevention. These are reviewed and up-to-date. Immunizations are up-to-date or declined. Labs ordered and will be reviewed.  Referral to GYN Labs ordered today: CBC, BMET, HFT, TSH, Lipid, UA  Genital Herpes,  Continue with Zovirax 200 mg once daily  Smoker Not interested in quitting at this time  Hypothyroidism, history Will evaluate TSH today  Bipolar disorder Continue with current medications  Keep scheduled appointments with Psychiatry.

## 2013-06-16 ENCOUNTER — Other Ambulatory Visit: Payer: Self-pay | Admitting: Physician Assistant

## 2013-06-16 DIAGNOSIS — E039 Hypothyroidism, unspecified: Secondary | ICD-10-CM

## 2013-06-16 MED ORDER — LEVOTHYROXINE SODIUM 50 MCG PO TABS: 50.0000 ug | ORAL_TABLET | Freq: Every day | ORAL | Status: DC

## 2013-07-24 ENCOUNTER — Encounter: Payer: BC Managed Care – PPO | Admitting: Nurse Practitioner

## 2013-08-14 ENCOUNTER — Other Ambulatory Visit: Payer: Self-pay | Admitting: Internal Medicine

## 2013-08-15 NOTE — Telephone Encounter (Signed)
Will refill but will need appt before further refills given

## 2013-08-15 NOTE — Telephone Encounter (Signed)
Last filled 1/15--last OV with you was over 1 year ago--please advise

## 2013-08-25 ENCOUNTER — Emergency Department (HOSPITAL_COMMUNITY): Payer: BC Managed Care – PPO

## 2013-08-25 ENCOUNTER — Emergency Department (HOSPITAL_COMMUNITY)
Admission: EM | Admit: 2013-08-25 | Discharge: 2013-08-25 | Disposition: A | Discharge status: Home / Self Care | Payer: BC Managed Care – PPO | Attending: Emergency Medicine | Admitting: Emergency Medicine

## 2013-08-25 ENCOUNTER — Encounter (HOSPITAL_COMMUNITY): Payer: Self-pay | Admitting: Emergency Medicine

## 2013-08-25 DIAGNOSIS — S0993XA Unspecified injury of face, initial encounter: Secondary | ICD-10-CM | POA: Insufficient documentation

## 2013-08-25 DIAGNOSIS — E876 Hypokalemia: Secondary | ICD-10-CM | POA: Insufficient documentation

## 2013-08-25 DIAGNOSIS — W010XXA Fall on same level from slipping, tripping and stumbling without subsequent striking against object, initial encounter: Secondary | ICD-10-CM | POA: Insufficient documentation

## 2013-08-25 DIAGNOSIS — E039 Hypothyroidism, unspecified: Secondary | ICD-10-CM | POA: Insufficient documentation

## 2013-08-25 DIAGNOSIS — Y9289 Other specified places as the place of occurrence of the external cause: Secondary | ICD-10-CM | POA: Insufficient documentation

## 2013-08-25 DIAGNOSIS — F17200 Nicotine dependence, unspecified, uncomplicated: Secondary | ICD-10-CM | POA: Insufficient documentation

## 2013-08-25 DIAGNOSIS — Z79899 Other long term (current) drug therapy: Secondary | ICD-10-CM | POA: Insufficient documentation

## 2013-08-25 DIAGNOSIS — Z8619 Personal history of other infectious and parasitic diseases: Secondary | ICD-10-CM | POA: Insufficient documentation

## 2013-08-25 DIAGNOSIS — R42 Dizziness and giddiness: Secondary | ICD-10-CM | POA: Insufficient documentation

## 2013-08-25 DIAGNOSIS — Y9389 Activity, other specified: Secondary | ICD-10-CM | POA: Insufficient documentation

## 2013-08-25 DIAGNOSIS — F329 Major depressive disorder, single episode, unspecified: Secondary | ICD-10-CM | POA: Insufficient documentation

## 2013-08-25 DIAGNOSIS — F3289 Other specified depressive episodes: Secondary | ICD-10-CM | POA: Insufficient documentation

## 2013-08-25 DIAGNOSIS — N289 Disorder of kidney and ureter, unspecified: Secondary | ICD-10-CM | POA: Insufficient documentation

## 2013-08-25 DIAGNOSIS — Z8739 Personal history of other diseases of the musculoskeletal system and connective tissue: Secondary | ICD-10-CM | POA: Insufficient documentation

## 2013-08-25 DIAGNOSIS — S199XXA Unspecified injury of neck, initial encounter: Principal | ICD-10-CM

## 2013-08-25 LAB — BASIC METABOLIC PANEL
BUN: 34 mg/dL — ABNORMAL HIGH (ref 6–23)
CALCIUM: 9.2 mg/dL (ref 8.4–10.5)
CO2: 26 mEq/L (ref 19–32)
CREATININE: 1.9 mg/dL — AB (ref 0.50–1.10)
Chloride: 103 mEq/L (ref 96–112)
GFR, EST AFRICAN AMERICAN: 33 mL/min — AB (ref >90)
GFR, EST NON AFRICAN AMERICAN: 29 mL/min — AB (ref >90)
GLUCOSE: 82 mg/dL (ref 70–99)
Potassium: 2.9 mEq/L — CL (ref 3.7–5.3)
Sodium: 145 mEq/L (ref 137–147)

## 2013-08-25 LAB — CBC WITH DIFFERENTIAL/PLATELET
BASOS PCT: 0 % (ref 0–1)
Basophils Absolute: 0 10*3/uL (ref 0.0–0.1)
EOS PCT: 1 % (ref 0–5)
Eosinophils Absolute: 0.1 10*3/uL (ref 0.0–0.7)
HCT: 34.7 % — ABNORMAL LOW (ref 36.0–46.0)
HEMOGLOBIN: 11.5 g/dL — AB (ref 12.0–15.0)
LYMPHS ABS: 2.4 10*3/uL (ref 0.7–4.0)
Lymphocytes Relative: 41 % (ref 12–46)
MCH: 31.8 pg (ref 26.0–34.0)
MCHC: 33.1 g/dL (ref 30.0–36.0)
MCV: 95.9 fL (ref 78.0–100.0)
MONO ABS: 0.7 10*3/uL (ref 0.1–1.0)
MONOS PCT: 12 % (ref 3–12)
NEUTROS PCT: 46 % (ref 43–77)
Neutro Abs: 2.7 10*3/uL (ref 1.7–7.7)
Platelets: 128 10*3/uL — ABNORMAL LOW (ref 150–400)
RBC: 3.62 MIL/uL — AB (ref 3.87–5.11)
RDW: 13.2 % (ref 11.5–15.5)
WBC: 5.9 10*3/uL (ref 4.0–10.5)

## 2013-08-25 MED ORDER — SODIUM CHLORIDE 0.9 % IV SOLN
1000.0000 mL | Freq: Once | INTRAVENOUS | Status: AC
  Administered 2013-08-25: 1000 mL via INTRAVENOUS

## 2013-08-25 MED ORDER — POTASSIUM CHLORIDE CRYS ER 20 MEQ PO TBCR
40.0000 meq | EXTENDED_RELEASE_TABLET | Freq: Once | ORAL | Status: AC
  Administered 2013-08-25: 40 meq via ORAL
  Filled 2013-08-25: qty 2

## 2013-08-25 MED ORDER — POTASSIUM CHLORIDE ER 20 MEQ PO TBCR: 10.0000 meq | EXTENDED_RELEASE_TABLET | Freq: Two times a day (BID) | ORAL | Status: DC

## 2013-08-25 MED ORDER — SODIUM CHLORIDE 0.9 % IV SOLN
1000.0000 mL | INTRAVENOUS | Status: DC
  Administered 2013-08-25: 1000 mL via INTRAVENOUS

## 2013-08-25 NOTE — ED Notes (Signed)
Patient transported to CT 

## 2013-08-25 NOTE — ED Notes (Addendum)
Pt reports on Sunday she was going to take a bubble bath, pt slipped and fell hitting her face and chest onto side of bathtub. Pt still has bruising to right side of face, under right eye, right cheek, right jaw area. Pt reports bruises under right breast. Pt reports she has been dizzy since the fall. At work pt was told she has been acting and talking slow, and bumping into things, and must get checked by a doctor before she can return to work. Pt answers questions appropriately but is slow with her speech.

## 2013-08-25 NOTE — Discharge Instructions (Signed)
Hypokalemia Hypokalemia means that the amount of potassium in the blood is lower than normal.Potassium is a chemical, called an electrolyte, that helps regulate the amount of fluid in the body. It also stimulates muscle contraction and helps nerves function properly.Most of the body's potassium is inside of cells, and only a very small amount is in the blood. Because the amount in the blood is so small, minor changes can be life-threatening. CAUSES  Antibiotics.  Diarrhea or vomiting.  Using laxatives too much, which can cause diarrhea.  Chronic kidney disease.  Water pills (diuretics).  Eating disorders (bulimia).  Low magnesium level.  Sweating a lot. SIGNS AND SYMPTOMS  Weakness.  Constipation.  Fatigue.  Muscle cramps.  Mental confusion.  Skipped heartbeats or irregular heartbeat (palpitations).  Tingling or numbness. DIAGNOSIS  Your health care provider can diagnose hypokalemia with blood tests. In addition to checking your potassium level, your health care provider may also check other lab tests. TREATMENT Hypokalemia can be treated with potassium supplements taken by mouth or adjustments in your current medicines. If your potassium level is very low, you may need to get potassium through a vein (IV) and be monitored in the hospital. A diet high in potassium is also helpful. Foods high in potassium are:  Nuts, such as peanuts and pistachios.  Seeds, such as sunflower seeds and pumpkin seeds.  Peas, lentils, and lima beans.  Whole grain and bran cereals and breads.  Fresh fruit and vegetables, such as apricots, avocado, bananas, cantaloupe, kiwi, oranges, tomatoes, asparagus, and potatoes.  Orange and tomato juices.  Red meats.  Fruit yogurt. HOME CARE INSTRUCTIONS  Take all medicines as prescribed by your health care provider.  Maintain a healthy diet by including nutritious food, such as fruits, vegetables, nuts, whole grains, and lean meats.  If  you are taking a laxative, be sure to follow the directions on the label. SEEK MEDICAL CARE IF:  Your weakness gets worse.  You feel your heart pounding or racing.  You are vomiting or having diarrhea.  You are diabetic and having trouble keeping your blood glucose in the normal range. SEEK IMMEDIATE MEDICAL CARE IF:  You have chest pain, shortness of breath, or dizziness.  You are vomiting or having diarrhea for more than 2 days.  You faint. MAKE SURE YOU:   Understand these instructions.  Will watch your condition.  Will get help right away if you are not doing well or get worse. Document Released: 03/09/2005 Document Revised: 12/28/2012 Document Reviewed: 09/09/2012 [facility] Patient Information 2014 [place].  Kidney Disease, Adult The kidneys are two organs that lie on either side of the spine between the middle of the back and the front of the abdomen. The kidneys:   Remove wastes and extra water from the blood.   Produce important hormones. These regulate blood pressure, help keep bones strong, and help create red blood cells.   Balance the fluids and chemicals in the blood and tissues. Kidney disease occurs when the kidneys are damaged. Kidney damage may be sudden (acute) or develop over a long period (chronic). A small amount of damage may not cause problems, but a large amount of damage may make it difficult or impossible for the kidneys to work the way they should. Early detection and treatment of kidney disease may prevent kidney damage from becoming permanent or getting worse. Some kidney diseases are curable, but most are not. Many people with kidney disease are able to control the disease and live a  normal life.  TYPES OF KIDNEY DISEASE  Acute kidney injury.Acute kidney injury occurs when there is sudden damage to the kidneys.  Chronic kidney disease. Chronic kidney disease occurs when the kidneys are damaged over a long period.  End-stage kidney  disease. End-stage kidney disease occurs when the kidneys are so damaged that they stop working. In end-stage kidney disease, the kidneys cannot get better. CAUSES Any condition, disease, or event that damages the kidneys may cause kidney disease. Acute kidney injury.  A problem with blood flow to the kidneys. This may be caused by:   Blood loss.   Heart disease.   Severe burns.   Liver disease.  Direct damage to the kidneys. This may be caused by:  Some medicines.   A kidney infection.   Poisoning or consuming toxic substances.   A surgical wound.   A blow to the kidney area.   A problem with urine flow. This may be caused by:   Cancer.   Kidney stones.   An enlarged prostate. Chronic kidney disease. The most common causes of chronic kidney disease are diabetes and high blood pressure (hypertension). Chronic kidney disease may also be caused by:   Diseases that cause the filtering units of the kidneys to become inflamed.   Diseases that affect the immune system.   Genetic diseases.   Medicines that damage the kidneys, such as anti-inflammatory medicines.  Poisoning or exposure to toxic substances.   A reoccurring kidney or urinary infection.   A problem with urine flow. This may be caused by:  Cancer.   Kidney stones.   An enlarged prostate in males. End-stage kidney disease. This kidney disease usually occurs when a chronic kidney disease gets worse. It may also occur after acute kidney injury.  SYMPTOMS   Swelling (edema) of the legs, ankles, or feet.   Tiredness (lethargy).   Nausea or vomiting.   Confusion.   Problems with urination, such as:   Painful or burning feeling during urination.   Decreased urine production.  Bloody urine.   Frequent urination, especially at night.  Hypertension.  Muscle twitches and cramps.   Shortness of breath.   Persistent itchiness.   Loss of appetite.  Metallic  taste in the mouth.   Weakness.   Seizures.   Chest pain or pressure.   Trouble sleeping.   Headaches.   Abnormally dark or light skin.   Numbness in the hands or feet.   Easy bruising.   Frequent hiccups.   Menstruation stops. Sometimes, no symptoms are present. DIAGNOSIS  Kidney disease may be detected and diagnosed by tests, including blood, urine, imaging, or kidney biopsy tests.  TREATMENT  Acute kidney injury. Treatment of acute kidney injury varies depending on the cause and severity of the kidney damage. In mild cases, no treatment may be needed. The kidneys may heal on their own. If acute kidney injury is more severe, your caregiver will treat the cause of the kidney damage, help the kidneys heal, and prevent complications from occurring. Severe cases may require a procedure to remove toxic wastes from the body (dialysis) or surgery to repair kidney damage. Surgery may involve:   Repair of a torn kidney.   Removal of an obstruction.  Most of the time, you will need to stay overnight at the hospital.  Chronic kidney disease. Most chronic kidney diseases cannot be cured. Treatment usually involves relieving symptoms and preventing or slowing the progression of the disease. Treatment may include:   A special  diet. You may need to avoid alcohol and foods that:   Have added salt.   Are high in potassium.   Are high in protein.   Medicines. These may:   Lower blood pressure.   Relieve anemia.   Relieve swelling.   Protect the bones.  End-stage kidney disease. End-stage kidney disease is life-threatening and must be treated immediately. There are two treatments for end-stage kidney disease:   Dialysis.   Receiving a new kidney (kidney transplant). Both of these treatments have serious risks and consequences. In addition to having dialysis or a kidney transplant, you may need to take medicines to control hypertension and cholesterol and  to decrease phosphorus levels in your blood. LENGTH OF ILLNESS  Acute kidney injury.The length of this disease varies greatly from person to person. Exactly how long it lasts depends on the cause of the kidney damage. Acute kidney injury may develop into chronic kidney disease or end-stage kidney disease.  Chronic kidney disease. This disease usually lasts a lifetime. Chronic kidney disease may worsen over time to become end-stage kidney disease. The time it takes for end-stage kidney disease to develop varies from person to person.  End-stage kidney disease. This disease lasts until a kidney transplant is performed. PREVENTION  Kidney disease can sometimes be prevented. If you have diabetes, hypertension, or any other condition that may lead to kidney disease, you should try to prevent kidney disease with:   An appropriate diet.  Medicine.  Lifestyle changes. FOR MORE INFORMATION  American Association of Kidney Patients: BombTimer.gl  National Kidney Foundation: www.kidney.[place]: https://mathis.com/  [facility]: www.lifeoptions.org and www.kidneyschool.org  Document Released: 03/09/2005 Document Revised: 02/24/2012 Document Reviewed: 11/06/2011 [facility] Patient Information 2014 [place], [place].

## 2013-08-25 NOTE — ED Provider Notes (Signed)
CSN: [id number]     Arrival date & time 08/25/13  1603 History   First MD Initiated Contact with Patient 08/25/13 1622     Chief Complaint  Patient presents with  . Fall  . Dizziness   HPI Patient presents to emergency room with complaints of facial pain and dizziness. Patient states she fell when slipping in the bathtub either Sunday or Monday evening. Since that time she's had some episodes of feeling dizzy lightheaded and she's also had persistent pain in her right cheek. Her coworkers have noticed that her speech seems to be a bit slow. She was told to come to emergency room to get checked out. Patient denies any neck pain. She denies any vomiting. She denies any focal numbness or weakness.  Patient does take Xanax 2 mg 3 times daily. She has not changed his dose recently and has not taken any extra medications. She denies any alcohol or drug use. Past Medical History  Diagnosis Date  . Bipolar affective disorder   . Depression   . Arthritis   . Hypothyroidism   . Genital herpes   . History of chicken pox    Past Surgical History  Procedure Laterality Date  . Tonsillectomy  1981  . Hemorrhoid surgery  2006  . Cesarean section  1993  . Tubal ligation  1994   Family History  Problem Relation Age of Onset  . Arthritis Mother   . Cancer Neg Hx   . Diabetes Neg Hx   . Stroke Neg Hx    History  Substance Use Topics  . Smoking status: Current Every Day Smoker -- 0.50 packs/day for 33 years  . Smokeless tobacco: Never Used  . Alcohol Use: Yes   OB History   Grav Para Term Preterm Abortions TAB SAB Ect Mult Living                 Review of Systems  All other systems reviewed and are negative.     Allergies  Bee venom  Home Medications   Prior to Admission medications   Medication Sig Start Date End Date Taking? Authorizing Provider  acyclovir (ZOVIRAX) 200 MG capsule Take 200 mg by mouth daily.   Yes Historical Provider, MD  alprazolam Duanne Moron) 2 MG tablet Take 2 mg  by mouth 3 (three) times daily.   Yes Historical Provider, MD       Historical Provider, MD  EPINEPHrine (EPI-PEN) 0.3 mg/0.3 mL DEVI Inject 0.3 mLs (0.3 mg total) into the muscle once. 06/29/12  Yes Webb Silversmith, NP  levothyroxine (SYNTHROID, LEVOTHROID) 50 MCG tablet Take 1 tablet (50 mcg total) by mouth daily. 06/16/13  Yes Stacy Gardner, PA-C  Patient use to take Depakote but she was switched to another medication. Patient cannot recall the name of that right now.  She says she is no longer taking Depakote BP 117/59  Pulse 78  Temp(Src) 98.3 F (36.8 C) (Oral)  Resp 16  SpO2 97% Physical Exam  Nursing note and vitals reviewed. Constitutional: She is oriented to person, place, and time. She appears well-developed and well-nourished. No distress.  HENT:  Head: Normocephalic and atraumatic.  Right Ear: External ear normal.  Left Ear: External ear normal.  Mouth/Throat: Oropharynx is clear and moist.  Eyes: Conjunctivae are normal. Right eye exhibits no discharge. Left eye exhibits no discharge. No scleral icterus.  Neck: Neck supple. No tracheal deviation present.  Cardiovascular: Normal rate, regular rhythm and intact distal pulses.   Pulmonary/Chest: Effort normal  and breath sounds normal. No stridor. No respiratory distress. She has no wheezes. She has no rales.  Abdominal: Soft. Bowel sounds are normal. She exhibits no distension. There is no tenderness. There is no rebound and no guarding.  Musculoskeletal: She exhibits no edema and no tenderness.  Neurological: She is alert and oriented to person, place, and time. She has normal strength. No cranial nerve deficit (no facial droop, extraocular movements intact, no slurred speech) or sensory deficit. She exhibits normal muscle tone. She displays no seizure activity. Coordination normal.  No pronator drift bilateral upper extrem, able to hold both legs off bed for 5 seconds, sensation intact in all extremities, no visual field cuts, no  left or right sided neglect,  no nystagmus noted   Skin: Skin is warm and dry. No rash noted.  Psychiatric: She has a normal mood and affect.    ED Course  Procedures (including critical care time) Labs Review Labs Reviewed  CBC WITH DIFFERENTIAL - Abnormal; Notable for the following:    RBC 3.62 (*)    Hemoglobin 11.5 (*)    HCT 34.7 (*)    Platelets 128 (*)    All other components within normal limits  BASIC METABOLIC PANEL - Abnormal; Notable for the following:    Potassium 2.9 (*)    BUN 34 (*)    Creatinine, Ser 1.90 (*)    GFR calc non Af Amer 29 (*)    GFR calc Af Amer 33 (*)    All other components within normal limits    Imaging Review Ct Head Wo Contrast  08/25/2013   CLINICAL DATA:  Fall, dizziness, injury  EXAM: CT HEAD WITHOUT CONTRAST  CT MAXILLOFACIAL WITHOUT CONTRAST  TECHNIQUE: Multidetector CT imaging of the head and maxillofacial structures were performed using the standard protocol without intravenous contrast. Multiplanar CT image reconstructions of the maxillofacial structures were also generated.  COMPARISON:  None.  FINDINGS: CT HEAD FINDINGS  No acute intracranial hemorrhage, mass lesion, definite infarction, midline shift, herniation, or hydrocephalus. Mild brain atrophy. No focal mass effect or edema. Normal gray-white matter differentiation. Cisterns patent. No cerebellar abnormality. Mastoids and sinuses are clear.  CT MAXILLOFACIAL FINDINGS  Facial bones appear intact. No displaced facial bony fracture or trauma. Specifically, the mandible, maxilla, pterygoid plates, zygomas, nasal septum, nasal bones, orbits, and skull base all appear intact. Orbits are symmetric. No proptosis or asymmetry. Minor right facial subcutaneous bruising/swelling. Visualized upper cervical spine unremarkable. Dental hardware creates artifact. No orbital blowout fracture.  IMPRESSION: No acute intracranial finding.  No acute facial bony trauma or fracture.   Electronically Signed    By: Daryll Brod M.D.   On: 08/25/2013 17:08   Ct Maxillofacial Wo Cm  08/25/2013   CLINICAL DATA:  Fall, dizziness, injury  EXAM: CT HEAD WITHOUT CONTRAST  CT MAXILLOFACIAL WITHOUT CONTRAST  TECHNIQUE: Multidetector CT imaging of the head and maxillofacial structures were performed using the standard protocol without intravenous contrast. Multiplanar CT image reconstructions of the maxillofacial structures were also generated.  COMPARISON:  None.  FINDINGS: CT HEAD FINDINGS  No acute intracranial hemorrhage, mass lesion, definite infarction, midline shift, herniation, or hydrocephalus. Mild brain atrophy. No focal mass effect or edema. Normal gray-white matter differentiation. Cisterns patent. No cerebellar abnormality. Mastoids and sinuses are clear.  CT MAXILLOFACIAL FINDINGS  Facial bones appear intact. No displaced facial bony fracture or trauma. Specifically, the mandible, maxilla, pterygoid plates, zygomas, nasal septum, nasal bones, orbits, and skull base all appear intact. Orbits  are symmetric. No proptosis or asymmetry. Minor right facial subcutaneous bruising/swelling. Visualized upper cervical spine unremarkable. Dental hardware creates artifact. No orbital blowout fracture.  IMPRESSION: No acute intracranial finding.  No acute facial bony trauma or fracture.   Electronically Signed   By: Daryll Brod M.D.   On: 08/25/2013 17:08     EKG Interpretation None      MDM   Final diagnoses:  Hypokalemia  Acute renal insufficiency    No evidence of serious injury associated with the patient's fall. It is possible that her Xanax is contributing to some of her speech pattern noticed by her coworkers. Recommend she try decreasing her today time dosing to 1 mg  Patient's renal function is elevated compared to previous labs. She's not having with vomiting or diarrhea. She's not on any medications that should be calling issues with her renal function.  I explained to her that it is important for  her to have this rechecked by her doctor.  Pt states she is no longer on depakote, therfore depakote level not checked.  At this time there does not appear to be any evidence of an acute emergency medical condition and the patient appears stable for discharge with appropriate outpatient follow up.    Dorie Rank, MD 08/25/13 Lurline Hare

## 2013-08-25 NOTE — ED Notes (Signed)
MD at bedside. 

## 2013-08-25 NOTE — ED Notes (Signed)
Potassium 2.9 critical result called from lab. Primary RN Wendall Mola notified.

## 2013-08-29 ENCOUNTER — Encounter: Payer: Self-pay | Admitting: Nurse Practitioner

## 2013-08-29 ENCOUNTER — Encounter: Payer: BC Managed Care – PPO | Admitting: Nurse Practitioner

## 2013-08-30 ENCOUNTER — Encounter: Payer: Self-pay | Admitting: Internal Medicine

## 2013-09-05 ENCOUNTER — Telehealth: Payer: Self-pay

## 2013-09-05 NOTE — Telephone Encounter (Signed)
Pt states that she gets chronic sinus infections.  Pt has not been seen since 06/15/2013 by nancy hartman, and wants an antibiotic.  Informed the pt that she needed to schedule an appt with one of our providers to establish care, and or go to an urgent care or hospital for her sx.  Pt states that she does not have a ride, and is too ill to leave her home.  I once again let her know that she has to be assessed first before receiving medication.  Pt hung up the phone.

## 2013-09-07 ENCOUNTER — Encounter: Payer: Self-pay | Admitting: Family Medicine

## 2013-09-07 ENCOUNTER — Encounter: Payer: Self-pay | Admitting: *Deleted

## 2013-09-07 ENCOUNTER — Ambulatory Visit (INDEPENDENT_AMBULATORY_CARE_PROVIDER_SITE_OTHER): Payer: BC Managed Care – PPO | Admitting: Family Medicine

## 2013-09-07 VITALS — BP 104/76 | HR 63 | Temp 98.4°F | Ht 65.0 in | Wt 157.0 lb

## 2013-09-07 DIAGNOSIS — J310 Chronic rhinitis: Secondary | ICD-10-CM

## 2013-09-07 DIAGNOSIS — J329 Chronic sinusitis, unspecified: Secondary | ICD-10-CM

## 2013-09-07 MED ORDER — AZITHROMYCIN 250 MG PO TABS: ORAL_TABLET | ORAL | Status: DC

## 2013-09-07 NOTE — Patient Instructions (Signed)
-  afrin twice daily for 4 days  -if not improving in 3-4 days start antibiotic  -follow up with ear, nose and throat doctor if persists or recurs

## 2013-09-07 NOTE — Progress Notes (Signed)
No chief complaint on file.   HPI:  -started: 2 days -symptoms:nasal congestion, sore throat, cough, sneezing, green mucus -denies:fever, SOB, NVD, tooth pain -has tried: nothing -sick contacts/travel/risks: denies flu exposure, tick exposure or or Ebola risks -Hx of: sinusitis - reports always gets zpack for this and wants zpack  ROS: See pertinent positives and negatives per HPI.  Past Medical History  Diagnosis Date  . Bipolar affective disorder   . Depression   . Arthritis   . Hypothyroidism   . Genital herpes   . History of chicken pox     Past Surgical History  Procedure Laterality Date  . Tonsillectomy  1981  . Hemorrhoid surgery  2006  . Cesarean section  1993  . Tubal ligation  1994    Family History  Problem Relation Age of Onset  . Arthritis Mother   . Cancer Neg Hx   . Diabetes Neg Hx   . Stroke Neg Hx     History   Social History  . Marital Status: Divorced    Spouse Name: N/A    Number of Children: 1  . Years of Education: 12   Occupational History  . Retired    Social History Main Topics  . Smoking status: Current Every Day Smoker -- 0.50 packs/day for 33 years  . Smokeless tobacco: Never Used  . Alcohol Use: Yes  . Drug Use: No  . Sexual Activity: Yes   Other Topics Concern  . None   Social History Narrative   Regular exercise-no   Caffeine Use-yes    Current outpatient prescriptions:acyclovir (ZOVIRAX) 200 MG capsule, Take 200 mg by mouth daily., Disp: , Rfl: ;  alprazolam (XANAX) 2 MG tablet, Take 2 mg by mouth 3 (three) times daily., Disp: , Rfl: ;  divalproex (DEPAKOTE) 250 MG DR tablet, Take 500-1,000 mg by mouth 2 (two) times daily. 500mg  in the am and 1000mg  PM, Disp: , Rfl:  EPINEPHrine (EPI-PEN) 0.3 mg/0.3 mL DEVI, Inject 0.3 mLs (0.3 mg total) into the muscle once., Disp: 1 Device, Rfl: 2;  levothyroxine (SYNTHROID, LEVOTHROID) 50 MCG tablet, Take 1 tablet (50 mcg total) by mouth daily., Disp: 90 tablet, Rfl: 1;  Potassium  Chloride ER 20 MEQ TBCR, Take 10 mEq by mouth 2 (two) times daily., Disp: 8 tablet, Rfl: 0;  azithromycin (ZITHROMAX) 250 MG tablet, 2 tablets on day one then 1 tablet daily, Disp: 6 tablet, Rfl: 0  EXAM:  Filed Vitals:   09/07/13 1326  BP: 104/76  Pulse: 63  Temp: 98.4 F (36.9 C)    Body mass index is 26.13 kg/(m^2).  GENERAL: vitals reviewed and listed above, alert, oriented, appears well hydrated and in no acute distress  HEENT: atraumatic, conjunttiva clear, no obvious abnormalities on inspection of external nose and ears, normal appearance of ear canals and TMs, white nasal congestion, mild post oropharyngeal erythema with PND, no tonsillar edema or exudate, no sinus TTP  NECK: no obvious masses on inspection  LUNGS: clear to auscultation bilaterally, no wheezes, rales or rhonchi, good air movement  CV: HRRR, no peripheral edema  MS: moves all extremities without noticeable abnormality  PSYCH: pleasant and cooperative, no obvious depression or anxiety  ASSESSMENT AND PLAN:  Discussed the following assessment and plan:  Rhinosinusitis  -given HPI and exam findings today, a serious infection or illness is unlikely. We discussed potential etiologies, with VURI being most likely, and advised supportive care and monitoring. We discussed treatment side effects, likely course, antibiotic misuse, transmission,  and signs of developing a serious illness. -she prefer abx and provided incase worsens or not improving -of course, we advised to return or notify a doctor immediately if symptoms worsen or persist or new concerns arise.    Patient Instructions  -afrin twice daily for 4 days  -if not improving in 3-4 days start antibiotic  -follow up with ear, nose and throat doctor if persists or recurs     KIM, HANNAH R.

## 2013-09-07 NOTE — Progress Notes (Signed)
Pre visit review using our clinic review tool, if applicable. No additional management support is needed unless otherwise documented below in the visit note. 

## 2013-09-08 ENCOUNTER — Telehealth: Payer: Self-pay | Admitting: Internal Medicine

## 2013-09-08 NOTE — Telephone Encounter (Signed)
Relevant patient education assigned to patient using Emmi. ° °

## 2013-10-19 ENCOUNTER — Encounter: Payer: Self-pay | Admitting: Internal Medicine

## 2013-10-25 ENCOUNTER — Ambulatory Visit (INDEPENDENT_AMBULATORY_CARE_PROVIDER_SITE_OTHER): Payer: BC Managed Care – PPO | Admitting: Internal Medicine

## 2013-10-25 ENCOUNTER — Other Ambulatory Visit (INDEPENDENT_AMBULATORY_CARE_PROVIDER_SITE_OTHER): Payer: BC Managed Care – PPO

## 2013-10-25 ENCOUNTER — Encounter: Payer: Self-pay | Admitting: Internal Medicine

## 2013-10-25 VITALS — BP 90/60 | Temp 97.2°F | Wt 164.5 lb

## 2013-10-25 DIAGNOSIS — R609 Edema, unspecified: Secondary | ICD-10-CM

## 2013-10-25 DIAGNOSIS — D649 Anemia, unspecified: Secondary | ICD-10-CM | POA: Insufficient documentation

## 2013-10-25 DIAGNOSIS — IMO0001 Reserved for inherently not codable concepts without codable children: Secondary | ICD-10-CM

## 2013-10-25 DIAGNOSIS — N289 Disorder of kidney and ureter, unspecified: Secondary | ICD-10-CM

## 2013-10-25 DIAGNOSIS — E039 Hypothyroidism, unspecified: Secondary | ICD-10-CM | POA: Insufficient documentation

## 2013-10-25 LAB — CBC WITH DIFFERENTIAL/PLATELET
Basophils Absolute: 0 K/uL (ref 0.0–0.1)
Basophils Relative: 0.4 % (ref 0.0–3.0)
Eosinophils Absolute: 0.1 K/uL (ref 0.0–0.7)
Eosinophils Relative: 1.4 % (ref 0.0–5.0)
HCT: 34.8 % — ABNORMAL LOW (ref 36.0–46.0)
Hemoglobin: 11.7 g/dL — ABNORMAL LOW (ref 12.0–15.0)
Lymphocytes Relative: 34.1 % (ref 12.0–46.0)
Lymphs Abs: 2.4 K/uL (ref 0.7–4.0)
MCHC: 33.5 g/dL (ref 30.0–36.0)
MCV: 97.4 fl (ref 78.0–100.0)
Monocytes Absolute: 1 K/uL (ref 0.1–1.0)
Monocytes Relative: 13.7 % — ABNORMAL HIGH (ref 3.0–12.0)
Neutro Abs: 3.5 K/uL (ref 1.4–7.7)
Neutrophils Relative %: 50.4 % (ref 43.0–77.0)
Platelets: 177 K/uL (ref 150.0–400.0)
RBC: 3.58 Mil/uL — ABNORMAL LOW (ref 3.87–5.11)
RDW: 18.1 % — ABNORMAL HIGH (ref 11.5–15.5)
WBC: 7 K/uL (ref 4.0–10.5)

## 2013-10-25 LAB — CK: Total CK: 57 U/L (ref 7–177)

## 2013-10-25 LAB — IBC PANEL
Iron: 87 ug/dL (ref 42–145)
Saturation Ratios: 22.8 % (ref 20.0–50.0)
Transferrin: 272.4 mg/dL (ref 212.0–360.0)

## 2013-10-25 LAB — BASIC METABOLIC PANEL WITH GFR
BUN: 27 mg/dL — ABNORMAL HIGH (ref 6–23)
CO2: 27 meq/L (ref 19–32)
Calcium: 8.9 mg/dL (ref 8.4–10.5)
Chloride: 104 meq/L (ref 96–112)
Creatinine, Ser: 0.8 mg/dL (ref 0.4–1.2)
GFR: 85.34 mL/min
Glucose, Bld: 86 mg/dL (ref 70–99)
Potassium: 4.3 meq/L (ref 3.5–5.1)
Sodium: 138 meq/L (ref 135–145)

## 2013-10-25 LAB — HEPATIC FUNCTION PANEL
ALT: 16 U/L (ref 0–35)
AST: 22 U/L (ref 0–37)
Albumin: 2.9 g/dL — ABNORMAL LOW (ref 3.5–5.2)
Alkaline Phosphatase: 48 U/L (ref 39–117)
Bilirubin, Direct: 0.1 mg/dL (ref 0.0–0.3)
Total Bilirubin: 0.4 mg/dL (ref 0.2–1.2)
Total Protein: 5.8 g/dL — ABNORMAL LOW (ref 6.0–8.3)

## 2013-10-25 LAB — VITAMIN B12: Vitamin B-12: 365 pg/mL (ref 211–911)

## 2013-10-25 LAB — MAGNESIUM: Magnesium: 2 mg/dL (ref 1.5–2.5)

## 2013-10-25 NOTE — Patient Instructions (Signed)
Your next office appointment will be determined based upon review of your pending labs . Those instructions will be transmitted to you through My Chart  OR  by mail;whichever process is your choice to receive results & recommendations . 

## 2013-10-25 NOTE — Progress Notes (Signed)
   Subjective:    Patient ID: [name], female    DOB: [birth year], 53 y.o.   MRN: [id number]  HPI  She describes bilateral ankle edema in the last several weeks. This is in the context of standing for up to 2-3 hours at work. She's been at this job however for at least 3 months.  The edema does not resolve overnight  She avoids sodium intake.  It is associated with cramps in her legs and "curling" of her toes at night.  She is not on amlodipine.  Her TSH was 9.80 on 06/15/13; she is on 50 mcg of thyroid daily.  Her creatinine was 0.8; BUN 13; and GFR 85.46 on 06/15/13. Repeat studies in ER  08/25/13 reveal a creatinine 1.9; BUN 34; and GFR of 29.  Potassium was 2.9.She also exhibited anemia at that time with hematocrit 11.5 and 34.7. Platelet count was mildly reduced to 128,000. She was seen for facial pain & dizziness in context of a recent fall in context of Xanax 2 mg tid. CT scans W/O contrast were negative.Decrease in Xanax dose recommended ; F/Uwith PCP to assess renal function recommended. To date only F/U was for acute sinusitis 09/07/13. That note recorded PMH of Bipolar affective disorder & depression.         Review of Systems   Chest pain, palpitations, tachycardia, exertional dyspnea, paroxysmal nocturnal dyspnea, or claudication  are absent.       Objective:   Physical Exam    Pertinent or positive findings include:  One half-1+ edema at the ankles.  Appears healthy and well-nourished & in no acute distress No carotid bruits are present.No neck pain distention present at  30 degrees. Thyroid normal to palpation Heart rhythm and rate are normal with no gallop or murmur Chest is clear with no increased work of breathing There is no evidence of aortic aneurysm or renal artery bruits Abdomen soft with no organomegaly or masses. No HJR No clubbing, cyanosis  present. Pedal pulses are intact  No ischemic skin changes are present . Fingernails healthy  Alert  and oriented. Strength, tone normal.DTRs reflexes 0+ @ knees. Skin: no suspicious rashes or lesions.Tatoo R ankle.          Assessment & Plan:  #1 edema #2 renal insufficiency #3 hypothyroidism #4 anemia See orders & AVS

## 2013-10-25 NOTE — Progress Notes (Signed)
Pre visit review using our clinic review tool, if applicable. No additional management support is needed unless otherwise documented below in the visit note. 

## 2013-11-18 ENCOUNTER — Emergency Department (HOSPITAL_COMMUNITY): Payer: Worker's Compensation

## 2013-11-18 ENCOUNTER — Encounter (HOSPITAL_COMMUNITY): Payer: Self-pay | Admitting: Emergency Medicine

## 2013-11-18 ENCOUNTER — Emergency Department (HOSPITAL_COMMUNITY)
Admission: EM | Admit: 2013-11-18 | Discharge: 2013-11-19 | Disposition: A | Discharge status: Home / Self Care | Payer: Worker's Compensation | Attending: Emergency Medicine | Admitting: Emergency Medicine

## 2013-11-18 DIAGNOSIS — Z8739 Personal history of other diseases of the musculoskeletal system and connective tissue: Secondary | ICD-10-CM | POA: Insufficient documentation

## 2013-11-18 DIAGNOSIS — Y990 Civilian activity done for income or pay: Secondary | ICD-10-CM | POA: Insufficient documentation

## 2013-11-18 DIAGNOSIS — S99919A Unspecified injury of unspecified ankle, initial encounter: Secondary | ICD-10-CM

## 2013-11-18 DIAGNOSIS — Z79899 Other long term (current) drug therapy: Secondary | ICD-10-CM | POA: Insufficient documentation

## 2013-11-18 DIAGNOSIS — F17200 Nicotine dependence, unspecified, uncomplicated: Secondary | ICD-10-CM | POA: Insufficient documentation

## 2013-11-18 DIAGNOSIS — Z8619 Personal history of other infectious and parasitic diseases: Secondary | ICD-10-CM | POA: Insufficient documentation

## 2013-11-18 DIAGNOSIS — S93409A Sprain of unspecified ligament of unspecified ankle, initial encounter: Secondary | ICD-10-CM | POA: Insufficient documentation

## 2013-11-18 DIAGNOSIS — S93401A Sprain of unspecified ligament of right ankle, initial encounter: Secondary | ICD-10-CM

## 2013-11-18 DIAGNOSIS — X503XXA Overexertion from repetitive movements, initial encounter: Secondary | ICD-10-CM | POA: Insufficient documentation

## 2013-11-18 DIAGNOSIS — E039 Hypothyroidism, unspecified: Secondary | ICD-10-CM | POA: Insufficient documentation

## 2013-11-18 DIAGNOSIS — S99929A Unspecified injury of unspecified foot, initial encounter: Secondary | ICD-10-CM

## 2013-11-18 DIAGNOSIS — Y92410 Unspecified street and highway as the place of occurrence of the external cause: Secondary | ICD-10-CM | POA: Insufficient documentation

## 2013-11-18 DIAGNOSIS — S8990XA Unspecified injury of unspecified lower leg, initial encounter: Secondary | ICD-10-CM | POA: Insufficient documentation

## 2013-11-18 DIAGNOSIS — F319 Bipolar disorder, unspecified: Secondary | ICD-10-CM | POA: Insufficient documentation

## 2013-11-18 NOTE — ED Notes (Signed)
Pt arrived to the ED with a complaint of right ankle pain.  Pt stepped off of the curbed and turned her ankle.  Pt is unable to place weight on the ankle.  Pt states that the pain is greater when she puts weight on it.

## 2013-11-18 NOTE — ED Provider Notes (Signed)
CSN: [id number]     Arrival date & time 11/18/13  2207 History   First MD Initiated Contact with Patient 11/18/13 2317   This chart was scribed for non-physician practitioner Hazel Sams, PA-C,  working with Wynetta Fines, MD by Rosary Lively, ED scribe. This patient was seen in room [facility] and the patient's care was started at 11:19 PM.    Chief Complaint  Patient presents with  . Ankle Pain   The history is provided by the patient. No language interpreter was used.   HPI Comments:  [name] is a 53 y.o. female who presents to the Emergency Department complaining of a right ankle injury. Pt reports that she came off of the sidewalk, and that she twisted her ankle sideways while she was on break at work. Pt denies previous injury or surgery. Pt denies weakness, or numbness in the toes. Pt reports that she was able to ambulate with difficulty after incident. Pt reports that she has limited ROM.  Past Medical History  Diagnosis Date  . Bipolar affective disorder   . Depression   . Arthritis   . Hypothyroidism   . Genital herpes   . History of chicken pox    Past Surgical History  Procedure Laterality Date  . Tonsillectomy  1981  . Hemorrhoid surgery  2006  . Cesarean section  1993  . Tubal ligation  1994  . Colonoscopy      neg   Family History  Problem Relation Age of Onset  . Arthritis Mother   . Cancer Neg Hx   . Diabetes Neg Hx   . Stroke Neg Hx   . Heart disease Neg Hx    History  Substance Use Topics  . Smoking status: Current Every Day Smoker -- 0.50 packs/day for 33 years  . Smokeless tobacco: Never Used     Comment: smoked 1983- present , up to 1/3 ppd  . Alcohol Use: Yes   OB History   Grav Para Term Preterm Abortions TAB SAB Ect Mult Living                 Review of Systems  Musculoskeletal: Positive for arthralgias, joint swelling and myalgias.  All other systems reviewed and are negative.     Allergies  Bee venom  Home Medications    Prior to Admission medications   Medication Sig Start Date End Date Taking? Authorizing Provider  acyclovir (ZOVIRAX) 200 MG capsule Take 200 mg by mouth daily.   Yes Historical Provider, MD  alprazolam Duanne Moron) 2 MG tablet Take 2 mg by mouth 3 (three) times daily as needed for anxiety.    Yes Historical Provider, MD  divalproex (DEPAKOTE) 250 MG DR tablet Take 500-1,000 mg by mouth 2 (two) times daily. 500mg  in the am and 1000mg  PM 08/10/13  Yes Historical Provider, MD  levothyroxine (SYNTHROID, LEVOTHROID) 50 MCG tablet Take 1 tablet (50 mcg total) by mouth daily. 06/16/13  Yes Stacy Gardner, PA-C  EPINEPHrine (EPI-PEN) 0.3 mg/0.3 mL DEVI Inject 0.3 mLs (0.3 mg total) into the muscle once. 06/29/12   Jearld Fenton, NP   BP 113/54  Pulse 98  Temp(Src) 98.1 F (36.7 C) (Oral)  Resp 16  SpO2 99% Physical Exam  Nursing note and vitals reviewed. Constitutional: She is oriented to person, place, and time. She appears well-developed and well-nourished.  HENT:  Head: Normocephalic and atraumatic.  Eyes: EOM are normal.  Neck: Normal range of motion. Neck supple.  Cardiovascular: Normal  rate.   Pulmonary/Chest: Effort normal.  Musculoskeletal:  Pain with movement of right ankle. Mild swelling to anterior and lateral aspect of ankle. Normal pulses and sensations in foot. No proximal fibula tenderness. No pain over medial malleolus.   Neurological: She is alert and oriented to person, place, and time.  Skin: Skin is warm and dry.  Psychiatric: She has a normal mood and affect. Her behavior is normal.    ED Course  Procedures  DIAGNOSTIC STUDIES: Oxygen Saturation is 99% on RA, normal by my interpretation.  COORDINATION OF CARE: 11:23 PM-Pt seen and evaluated and appears well. No acute distress.   Imaging Review Dg Ankle Complete Right  11/18/2013   CLINICAL DATA:  Right ankle injury with twisting. Lateral pain and limited range of motion.  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 No evidence of acute fracture or dislocation. Talar dome and ankle mortise appear intact. Soft tissues are unremarkable.  IMPRESSION: No acute bony abnormalities.   Electronically Signed   By: Lucienne Capers M.D.   On: 11/18/2013 23:44     MDM   Final diagnoses:  Ankle sprain, right, initial encounter    I personally performed the services described in this documentation, which was scribed in my presence. The recorded information has been reviewed and is accurate.      Martie Lee, PA-C 11/18/13 2354

## 2013-11-18 NOTE — Discharge Instructions (Signed)
Your x-rays do not show any broken bones or other concerning injuries. At this time your provider(s) feel you have a sprained ankle. Use Rest, Ice, Compression and Elevation to reduce pain and swelling.    Ankle Sprain An ankle sprain is an injury to the strong, fibrous tissues (ligaments) that hold the bones of your ankle joint together.  CAUSES An ankle sprain is usually caused by a fall or by twisting your ankle. Ankle sprains most commonly occur when you step on the outer edge of your foot, and your ankle turns inward. People who participate in sports are more prone to these types of injuries.  SYMPTOMS   Pain in your ankle. The pain may be present at rest or only when you are trying to stand or walk.  Swelling.  Bruising. Bruising may develop immediately or within 1 to 2 days after your injury.  Difficulty standing or walking, particularly when turning corners or changing directions. DIAGNOSIS  Your caregiver will ask you details about your injury and perform a physical exam of your ankle to determine if you have an ankle sprain. During the physical exam, your caregiver will press on and apply pressure to specific areas of your foot and ankle. Your caregiver will try to move your ankle in certain ways. An X-ray exam may be done to be sure a bone was not broken or a ligament did not separate from one of the bones in your ankle (avulsion fracture).  TREATMENT  Certain types of braces can help stabilize your ankle. Your caregiver can make a recommendation for this. Your caregiver may recommend the use of medicine for pain. If your sprain is severe, your caregiver may refer you to a surgeon who helps to restore function to parts of your skeletal system (orthopedist) or a physical therapist. [place] ice to your injury for 1-2 days or as directed by your caregiver. Applying ice helps to reduce inflammation and pain.  Put ice in a plastic bag.  Place a towel between  your skin and the bag.  Leave the ice on for 15-20 minutes at a time, every 2 hours while you are awake.  Only take over-the-counter or prescription medicines for pain, discomfort, or fever as directed by your caregiver.  Elevate your injured ankle above the level of your heart as much as possible for 2-3 days.  If your caregiver recommends crutches, use them as instructed. Gradually put weight on the affected ankle. Continue to use crutches or a cane until you can walk without feeling pain in your ankle.  If you have a plaster splint, wear the splint as directed by your caregiver. Do not rest it on anything harder than a pillow for the first 24 hours. Do not put weight on it. Do not get it wet. You may take it off to take a shower or bath.  You may have been given an elastic bandage to wear around your ankle to provide support. If the elastic bandage is too tight (you have numbness or tingling in your foot or your foot becomes cold and blue), adjust the bandage to make it comfortable.  If you have an air splint, you may blow more air into it or let air out to make it more comfortable. You may take your splint off at night and before taking a shower or bath. Wiggle your toes in the splint several times per day to decrease swelling. SEEK MEDICAL CARE IF:   You have rapidly  increasing bruising or swelling.  Your toes feel extremely cold or you lose feeling in your foot.  Your pain is not relieved with medicine. SEEK IMMEDIATE MEDICAL CARE IF:  Your toes are numb or blue.  You have severe pain that is increasing. MAKE SURE YOU:   Understand these instructions.  Will watch your condition.  Will get help right away if you are not doing well or get worse. Document Released: 03/09/2005 Document Revised: 12/02/2011 Document Reviewed: 03/21/2011 [facility] Patient Information 2015 [place], [place]. This information is not intended to replace advice given to you by your health care provider.  Make sure you discuss any questions you have with your health care provider.

## 2013-11-19 NOTE — ED Provider Notes (Signed)
Medical screening examination/treatment/procedure(s) were performed by non-physician practitioner and as supervising physician I was immediately available for consultation/collaboration.   EKG Interpretation None        Wynetta Fines, MD 11/19/13 [phone]

## 2013-12-13 ENCOUNTER — Other Ambulatory Visit: Payer: Self-pay | Admitting: Internal Medicine

## 2013-12-16 ENCOUNTER — Other Ambulatory Visit: Payer: Self-pay | Admitting: Internal Medicine

## 2013-12-19 ENCOUNTER — Other Ambulatory Visit: Payer: Self-pay | Admitting: Geriatric Medicine

## 2013-12-19 ENCOUNTER — Telehealth: Payer: Self-pay | Admitting: Internal Medicine

## 2013-12-19 MED ORDER — ACYCLOVIR 200 MG PO CAPS: 200.0000 mg | ORAL_CAPSULE | Freq: Every day | ORAL | Status: DC

## 2013-12-19 NOTE — Telephone Encounter (Signed)
Pt called in very upset about this script that being refilled.  Explain to her that it was fax to Dr Garnette Gunner.  Also that her Dr was no longer at this location.  Explain to her that she will be est with Dr and would send to her and see if she can refill it.  She is express that if this is not refilled she was going somewhere else.  She is requesting a call back either way.      Thank you

## 2013-12-19 NOTE — Telephone Encounter (Signed)
Patient is requesting refill on acyclovir sent to [facility] on [address].  Has appt 10/20 to establish care

## 2013-12-20 ENCOUNTER — Other Ambulatory Visit: Payer: Self-pay | Admitting: Internal Medicine

## 2014-01-09 ENCOUNTER — Encounter: Payer: Self-pay | Admitting: Internal Medicine

## 2014-01-09 ENCOUNTER — Other Ambulatory Visit: Payer: Self-pay | Admitting: Geriatric Medicine

## 2014-01-09 ENCOUNTER — Other Ambulatory Visit (INDEPENDENT_AMBULATORY_CARE_PROVIDER_SITE_OTHER): Payer: BC Managed Care – PPO

## 2014-01-09 ENCOUNTER — Ambulatory Visit (INDEPENDENT_AMBULATORY_CARE_PROVIDER_SITE_OTHER): Payer: BC Managed Care – PPO | Admitting: Internal Medicine

## 2014-01-09 VITALS — BP 110/62 | HR 66 | Temp 98.5°F | Resp 18 | Ht 65.0 in | Wt 177.8 lb

## 2014-01-09 DIAGNOSIS — F17200 Nicotine dependence, unspecified, uncomplicated: Secondary | ICD-10-CM | POA: Insufficient documentation

## 2014-01-09 DIAGNOSIS — Z0000 Encounter for general adult medical examination without abnormal findings: Secondary | ICD-10-CM

## 2014-01-09 DIAGNOSIS — A6000 Herpesviral infection of urogenital system, unspecified: Secondary | ICD-10-CM

## 2014-01-09 DIAGNOSIS — E039 Hypothyroidism, unspecified: Secondary | ICD-10-CM

## 2014-01-09 DIAGNOSIS — N289 Disorder of kidney and ureter, unspecified: Secondary | ICD-10-CM

## 2014-01-09 DIAGNOSIS — T782XXS Anaphylactic shock, unspecified, sequela: Secondary | ICD-10-CM

## 2014-01-09 DIAGNOSIS — Z23 Encounter for immunization: Secondary | ICD-10-CM

## 2014-01-09 DIAGNOSIS — Z720 Tobacco use: Secondary | ICD-10-CM | POA: Insufficient documentation

## 2014-01-09 LAB — TSH: TSH: 1.13 u[IU]/mL (ref 0.35–4.50)

## 2014-01-09 LAB — T4, FREE: Free T4: 0.76 ng/dL (ref 0.60–1.60)

## 2014-01-09 MED ORDER — EPINEPHRINE 0.3 MG/0.3ML IJ SOAJ: 0.3000 mg | Freq: Once | INTRAMUSCULAR | Status: DC

## 2014-01-09 MED ORDER — VARENICLINE TARTRATE 0.5 MG X 11 & 1 MG X 42 PO MISC: ORAL | Status: DC

## 2014-01-09 MED ORDER — ACYCLOVIR 200 MG PO CAPS: 200.0000 mg | ORAL_CAPSULE | Freq: Every day | ORAL | Status: DC

## 2014-01-09 NOTE — Progress Notes (Addendum)
   Subjective:    Patient ID: [name], female    DOB: [birth year], 53 y.o.   MRN: [id number]  HPI The patient is a 53 YO female who is coming in to establish care. She has PMH of anemia, hypothyroidism, bipolar disease. She is having some extra stress in her life right now as her daughter has broken up and moved back in with her. She is not sure her thyroid medicine is right as she is still having some tiredness, weight gain, and fatigue.   Review of Systems  Constitutional: Positive for activity change and fatigue. Negative for fever, appetite change and unexpected weight change.  HENT: Negative.   Respiratory: Negative for cough, chest tightness, shortness of breath and wheezing.   Cardiovascular: Negative for chest pain, palpitations and leg swelling.  Gastrointestinal: Negative for abdominal pain, diarrhea, constipation and abdominal distention.  Endocrine: Positive for cold intolerance. Negative for polydipsia, polyphagia and polyuria.  Musculoskeletal: Negative for arthralgias, gait problem and myalgias.  Skin: Negative.   Neurological: Positive for weakness. Negative for dizziness, numbness and headaches.  Psychiatric/Behavioral: Positive for dysphoric mood. The patient is nervous/anxious.       Objective:   Physical Exam  Constitutional: She is oriented to person, place, and time. She appears well-developed and well-nourished.  HENT:  Head: Normocephalic and atraumatic.  Eyes: EOM are normal.  Neck: Normal range of motion. No thyromegaly present.  Cardiovascular: Normal rate and regular rhythm.   Pulmonary/Chest: Effort normal and breath sounds normal. No respiratory distress. She has no wheezes. She has no rales.  Abdominal: Soft. Bowel sounds are normal.  Neurological: She is alert and oriented to person, place, and time. Coordination normal.  Skin: Skin is warm and dry.   Filed Vitals:   01/09/14 1258  BP: 110/62  Pulse: 66  Temp: 98.5 F (36.9 C)  TempSrc:  Oral  Resp: 18  Height: 5\' 5"  (1.651 m)  Weight: 177 lb 12.8 oz (80.65 kg)  SpO2: 96%      Assessment & Plan:  Flu shot given today.

## 2014-01-09 NOTE — Patient Instructions (Addendum)
We will check your thyroid levels today and may need to change your medcine levels.   We have refilled your medicines as well as your epipen.   We have given you the flu shot today.  Come back in about 1 year for a check up or call us sooner if you are sick or have questions.   Keep working on stopping smoking as this will be good for your health and the best thing you can do to stay healthy.  Smoking Cessation Quitting smoking is important to your health and has many advantages. However, it is not always easy to quit since nicotine is a very addictive drug. Oftentimes, people try 3 times or more before being able to quit. This document explains the best ways for you to prepare to quit smoking. Quitting takes hard work and a lot of effort, but you can do it. ADVANTAGES OF QUITTING SMOKING  You will live longer, feel better, and live better.  Your body will feel the impact of quitting smoking almost immediately.  Within 20 minutes, blood pressure decreases. Your pulse returns to its normal level.  After 8 hours, carbon monoxide levels in the blood return to normal. Your oxygen level increases.  After 24 hours, the chance of having a heart attack starts to decrease. Your breath, hair, and body stop smelling like smoke.  After 48 hours, damaged nerve endings begin to recover. Your sense of taste and smell improve.  After 72 hours, the body is virtually free of nicotine. Your bronchial tubes relax and breathing becomes easier.  After 2 to 12 weeks, lungs can hold more air. Exercise becomes easier and circulation improves.  The risk of having a heart attack, stroke, cancer, or lung disease is greatly reduced.  After 1 year, the risk of coronary heart disease is cut in half.  After 5 years, the risk of stroke falls to the same as a nonsmoker.  After 10 years, the risk of lung cancer is cut in half and the risk of other cancers decreases significantly.  After 15 years, the risk of  coronary heart disease drops, usually to the level of a nonsmoker.  If you are pregnant, quitting smoking will improve your chances of having a healthy baby.  The people you live with, especially any children, will be healthier.  You will have extra money to spend on things other than cigarettes. QUESTIONS TO THINK ABOUT BEFORE ATTEMPTING TO QUIT You may want to talk about your answers with your health care provider.  Why do you want to quit?  If you tried to quit in the past, what helped and what did not?  What will be the most difficult situations for you after you quit? How will you plan to handle them?  Who can help you through the tough times? Your family? Friends? A health care provider?  What pleasures do you get from smoking? What ways can you still get pleasure if you quit? Here are some questions to ask your health care provider:  How can you help me to be successful at quitting?  What medicine do you think would be best for me and how should I take it?  What should I do if I need more help?  What is smoking withdrawal like? How can I get information on withdrawal? GET READY  Set a quit date.  Change your environment by getting rid of all cigarettes, ashtrays, matches, and lighters in your home, car, or work. Do not let people  smoke in your home.  Review your past attempts to quit. Think about what worked and what did not. GET SUPPORT AND ENCOURAGEMENT You have a better chance of being successful if you have help. You can get support in many ways.  Tell your family, friends, and coworkers that you are going to quit and need their support. Ask them not to smoke around you.  Get individual, group, or telephone counseling and support. Programs are available at [employer] and health centers. Call your local health department for information about programs in your area.  Spiritual beliefs and practices may help some smokers quit.  Download a "quit meter" on your  computer to keep track of quit statistics, such as how long you have gone without smoking, cigarettes not smoked, and money saved.  Get a self-help book about quitting smoking and staying off tobacco. Hoke yourself from urges to smoke. Talk to someone, go for a walk, or occupy your time with a task.  Change your normal routine. Take a different route to work. Drink tea instead of coffee. Eat breakfast in a different place.  Reduce your stress. Take a hot bath, exercise, or read a book.  Plan something enjoyable to do every day. Reward yourself for not smoking.  Explore interactive web-based programs that specialize in helping you quit. GET MEDICINE AND USE IT CORRECTLY Medicines can help you stop smoking and decrease the urge to smoke. Combining medicine with the above behavioral methods and support can greatly increase your chances of successfully quitting smoking.  Nicotine replacement therapy helps deliver nicotine to your body without the negative effects and risks of smoking. Nicotine replacement therapy includes nicotine gum, lozenges, inhalers, nasal sprays, and skin patches. Some may be available over-the-counter and others require a prescription.  Antidepressant medicine helps people abstain from smoking, but how this works is unknown. This medicine is available by prescription.  Nicotinic receptor partial agonist medicine simulates the effect of nicotine in your brain. This medicine is available by prescription. Ask your health care provider for advice about which medicines to use and how to use them based on your health history. Your health care provider will tell you what side effects to look out for if you choose to be on a medicine or therapy. Carefully read the information on the package. Do not use any other product containing nicotine while using a nicotine replacement product.  RELAPSE OR DIFFICULT SITUATIONS Most relapses occur within the  first 3 months after quitting. Do not be discouraged if you start smoking again. Remember, most people try several times before finally quitting. You may have symptoms of withdrawal because your body is used to nicotine. You may crave cigarettes, be irritable, feel very hungry, cough often, get headaches, or have difficulty concentrating. The withdrawal symptoms are only temporary. They are strongest when you first quit, but they will go away within 10-14 days. To reduce the chances of relapse, try to:  Avoid drinking alcohol. Drinking lowers your chances of successfully quitting.  Reduce the amount of caffeine you consume. Once you quit smoking, the amount of caffeine in your body increases and can give you symptoms, such as a rapid heartbeat, sweating, and anxiety.  Avoid smokers because they can make you want to smoke.  Do not let weight gain distract you. Many smokers will gain weight when they quit, usually less than 10 pounds. Eat a healthy diet and stay active. You can always lose the weight gained  after you quit.  Find ways to improve your mood other than smoking. FOR MORE INFORMATION  www.smokefree.gov  Document Released: 03/03/2001 Document Revised: 07/24/2013 Document Reviewed: 06/18/2011 [facility] Patient Information 2015 [place], [place]. This information is not intended to replace advice given to you by your health care provider. Make sure you discuss any questions you have with your health care provider.

## 2014-01-09 NOTE — Assessment & Plan Note (Signed)
Spoke with patient about risks and quitting and she would like to have chantix to try to quit.

## 2014-01-09 NOTE — Progress Notes (Signed)
Pre visit review using our clinic review tool, if applicable. No additional management support is needed unless otherwise documented below in the visit note. 

## 2014-01-09 NOTE — Assessment & Plan Note (Signed)
Recommended she see OB/Gyn for PAP smears given history of abnormal PAP and colposcopy. Flu shot given today and up to date on other health maintenance.

## 2014-01-09 NOTE — Assessment & Plan Note (Signed)
Continue acyclovir daily for prevention of lesions.

## 2014-01-09 NOTE — Assessment & Plan Note (Signed)
Check TSH and free T4 since not checked since starting medicine in March.

## 2014-01-09 NOTE — Assessment & Plan Note (Signed)
Appears to be resolved as of recent BMP and will resolve this issue.

## 2014-01-24 ENCOUNTER — Other Ambulatory Visit: Payer: Self-pay | Admitting: Geriatric Medicine

## 2014-01-24 MED ORDER — LEVOTHYROXINE SODIUM 50 MCG PO TABS: 50.0000 ug | ORAL_TABLET | Freq: Every day | ORAL | Status: DC

## 2014-01-26 ENCOUNTER — Other Ambulatory Visit: Payer: Self-pay | Admitting: Geriatric Medicine

## 2014-01-26 MED ORDER — LEVOTHYROXINE SODIUM 50 MCG PO TABS: 50.0000 ug | ORAL_TABLET | Freq: Every day | ORAL | Status: DC

## 2014-03-22 ENCOUNTER — Other Ambulatory Visit: Payer: Self-pay | Admitting: Internal Medicine

## 2014-06-12 ENCOUNTER — Ambulatory Visit (INDEPENDENT_AMBULATORY_CARE_PROVIDER_SITE_OTHER): Payer: 59 | Admitting: Internal Medicine

## 2014-06-12 ENCOUNTER — Encounter: Payer: Self-pay | Admitting: Internal Medicine

## 2014-06-12 VITALS — BP 108/72 | HR 88 | Temp 98.3°F | Resp 16 | Wt 184.8 lb

## 2014-06-12 DIAGNOSIS — M25562 Pain in left knee: Secondary | ICD-10-CM | POA: Diagnosis not present

## 2014-06-12 NOTE — Patient Instructions (Signed)
We will get the records and review them to see what we think about them. The other option you have once we get the records would be to take those to another orthopedic doctor to see if they agree.

## 2014-06-12 NOTE — Progress Notes (Signed)
Pre visit review using our clinic review tool, if applicable. No additional management support is needed unless otherwise documented below in the visit note. 

## 2014-06-14 DIAGNOSIS — M2242 Chondromalacia patellae, left knee: Secondary | ICD-10-CM | POA: Insufficient documentation

## 2014-06-14 NOTE — Assessment & Plan Note (Signed)
She wishes me to review her MRI records to make sure she was told the truth. Advised her that I am not associated with workman's comp and she knows. She does not have obvious sign of ligament strain or tear on exam. No fluid in the joint. She likely would benefit from a knee injection but she does not want that. Will request records from orthopedics to see her MRI and x-ray and advise her from there. Continue using OTC pain medication as needed.

## 2014-06-14 NOTE — Progress Notes (Signed)
   Subjective:    Patient ID: [name], female    DOB: 10-08-1960, 54 y.o.   MRN: [id number]  HPI The patient is a 54 YO female who is coming in today for left knee pain. She has been dealing with workman's comp for some time and does not feel she is getting adequate care. She had MRI and x-ray and was not referred for surgery but did physical therapy which did not help. She then was advised to try an injection in her knee which she did not do. She is still having pain but no collapse of the knee joint. Using OTC for pain which is doing okay. Does not feel she is able to work but was told by the [employer] comp doctor she was fit to work.   Review of Systems  Constitutional: Negative.   Respiratory: Negative.   Musculoskeletal: Positive for arthralgias. Negative for myalgias, back pain and gait problem.  Neurological: Negative.   Psychiatric/Behavioral: Negative.       Objective:   Physical Exam  Constitutional: She is oriented to person, place, and time. She appears well-developed and well-nourished.  HENT:  Head: Normocephalic and atraumatic.  Eyes: EOM are normal.  Neck: Normal range of motion.  Musculoskeletal: She exhibits tenderness.  Left knee tender to touch over the patella, no swelling, ACL/PCL intact, LCL and MCL intact. No tenderness over the anserine bursa.   Neurological: She is alert and oriented to person, place, and time. Coordination normal.  Psychiatric:  Slightly hostile during our visit.    Filed Vitals:   06/12/14 1341  BP: 108/72  Pulse: 88  Temp: 98.3 F (36.8 C)  TempSrc: Oral  Resp: 16  Weight: 184 lb 12.8 oz (83.825 kg)  SpO2: 92%      Assessment & Plan:

## 2014-06-19 ENCOUNTER — Telehealth: Payer: Self-pay | Admitting: Internal Medicine

## 2014-06-19 NOTE — Telephone Encounter (Signed)
Rec'd records from [facility] Forwarding 4 pages to [employer]

## 2014-06-21 LAB — HM PAP SMEAR

## 2014-07-30 ENCOUNTER — Encounter: Payer: Self-pay | Admitting: Internal Medicine

## 2014-11-12 ENCOUNTER — Ambulatory Visit (INDEPENDENT_AMBULATORY_CARE_PROVIDER_SITE_OTHER)
Admission: RE | Admit: 2014-11-12 | Discharge: 2014-11-12 | Disposition: A | Discharge status: Home / Self Care | Payer: 59 | Source: Ambulatory Visit | Attending: Family | Admitting: Family

## 2014-11-12 ENCOUNTER — Encounter: Payer: Self-pay | Admitting: Family

## 2014-11-12 ENCOUNTER — Ambulatory Visit (INDEPENDENT_AMBULATORY_CARE_PROVIDER_SITE_OTHER): Payer: 59 | Admitting: Family

## 2014-11-12 VITALS — BP 142/92 | HR 83 | Temp 98.2°F | Resp 18 | Ht 65.0 in | Wt 172.0 lb

## 2014-11-12 DIAGNOSIS — R112 Nausea with vomiting, unspecified: Secondary | ICD-10-CM | POA: Insufficient documentation

## 2014-11-12 DIAGNOSIS — R1110 Vomiting, unspecified: Secondary | ICD-10-CM | POA: Diagnosis not present

## 2014-11-12 MED ORDER — ONDANSETRON 4 MG PO TBDP: 4.0000 mg | ORAL_TABLET | Freq: Four times a day (QID) | ORAL | Status: DC | PRN

## 2014-11-12 MED ORDER — ONDANSETRON HCL 4 MG/2ML IJ SOLN
2.0000 mg | Freq: Once | INTRAMUSCULAR | Status: AC
  Administered 2014-11-12: 2 mg via INTRAMUSCULAR

## 2014-11-12 NOTE — Patient Instructions (Signed)
Thank you for choosing [facility].  Summary/Instructions:  Your prescription(s) have been submitted to your pharmacy or been printed and provided for you. Please take as directed and contact our office if you believe you are having problem(s) with the medication(s) or have any questions.  If your symptoms worsen or fail to improve, please contact our office for further instruction, or in case of emergency go directly to the emergency room at the closest medical facility.   Nausea and Vomiting Nausea is a sick feeling that often comes before throwing up (vomiting). Vomiting is a reflex where stomach contents come out of your mouth. Vomiting can cause severe loss of body fluids (dehydration). Children and elderly adults can become dehydrated quickly, especially if they also have diarrhea. Nausea and vomiting are symptoms of a condition or disease. It is important to find the cause of your symptoms. CAUSES   Direct irritation of the stomach lining. This irritation can result from increased acid production (gastroesophageal reflux disease), infection, food poisoning, taking certain medicines (such as nonsteroidal anti-inflammatory drugs), alcohol use, or tobacco use.  Signals from the brain.These signals could be caused by a headache, heat exposure, an inner ear disturbance, increased pressure in the brain from injury, infection, a tumor, or a concussion, pain, emotional stimulus, or metabolic problems.  An obstruction in the gastrointestinal tract (bowel obstruction).  Illnesses such as diabetes, hepatitis, gallbladder problems, appendicitis, kidney problems, cancer, sepsis, atypical symptoms of a heart attack, or eating disorders.  Medical treatments such as chemotherapy and radiation.  Receiving medicine that makes you sleep (general anesthetic) during surgery. DIAGNOSIS Your caregiver may ask for tests to be done if the problems do not improve after a few days. Tests may also be done  if symptoms are severe or if the reason for the nausea and vomiting is not clear. Tests may include:  Urine tests.  Blood tests.  Stool tests.  Cultures (to look for evidence of infection).  X-rays or other imaging studies. Test results can help your caregiver make decisions about treatment or the need for additional tests. TREATMENT You need to stay well hydrated. Drink frequently but in small amounts.You may wish to drink water, sports drinks, clear broth, or eat frozen ice pops or gelatin dessert to help stay hydrated.When you eat, eating slowly may help prevent nausea.There are also some antinausea medicines that may help prevent nausea. HOME CARE INSTRUCTIONS   Take all medicine as directed by your caregiver.  If you do not have an appetite, do not force yourself to eat. However, you must continue to drink fluids.  If you have an appetite, eat a normal diet unless your caregiver tells you differently.  Eat a variety of complex carbohydrates (rice, wheat, potatoes, bread), lean meats, yogurt, fruits, and vegetables.  Avoid high-fat foods because they are more difficult to digest.  Drink enough water and fluids to keep your urine clear or pale yellow.  If you are dehydrated, ask your caregiver for specific rehydration instructions. Signs of dehydration may include:  Severe thirst.  Dry lips and mouth.  Dizziness.  Dark urine.  Decreasing urine frequency and amount.  Confusion.  Rapid breathing or pulse. SEEK IMMEDIATE MEDICAL CARE IF:   You have blood or brown flecks (like coffee grounds) in your vomit.  You have black or bloody stools.  You have a severe headache or stiff neck.  You are confused.  You have severe abdominal pain.  You have chest pain or trouble breathing.  You do  not urinate at least once every 8 hours.  You develop cold or clammy skin.  You continue to vomit for longer than 24 to 48 hours.  You have a fever. MAKE SURE YOU:    Understand these instructions.  Will watch your condition.  Will get help right away if you are not doing well or get worse. Document Released: 03/09/2005 Document Revised: 06/01/2011 Document Reviewed: 08/06/2010 [facility] Patient Information 2015 [place], [place]. This information is not intended to replace advice given to you by your health care provider. Make sure you discuss any questions you have with your health care provider.

## 2014-11-12 NOTE — Assessment & Plan Note (Addendum)
Nausea and vomiting of undetermined origin. Vital signs stable. Question possible gastritis or constipation. Obtain x-rays to rule out obstruction. In office injection of ondansetron provided. Start ondansetron disintegrating tablets as needed. Patient instructed to seek further care if unable to consume fluids by tomorrow.

## 2014-11-12 NOTE — Progress Notes (Signed)
Pre visit review using our clinic review tool, if applicable. No additional management support is needed unless otherwise documented below in the visit note. 

## 2014-11-12 NOTE — Progress Notes (Signed)
Subjective:    Patient ID: [name], female    DOB: 03-13-1961, 54 y.o.   MRN: [id number]  Chief Complaint  Patient presents with  . Vomiting    since thursday evening, can not keep anything down, not able to take medication bc she can not keep it down    HPI:  [name] is a 54 y.o. female with a PMH of tobacco use, hypothyroidism, and genital herpes who presents today for an acute office visit.    1.) Vomiting - This is a new problem. Associated symptom of vomiting has been going on for about 4 days. Modifying factors include water, gatorade, and ginger ale which did not help and was not able to keep it down.  Severity of her symptoms has prevented her from taking her medications and has lost about 188 at her home scale and now weighs 172.  Food and fluids make increase her symptoms.   Wt Readings from Last 3 Encounters:  11/12/14 172 lb (78.019 kg)  06/12/14 184 lb 12.8 oz (83.825 kg)  01/09/14 177 lb 12.8 oz (80.65 kg)    Allergies  Allergen Reactions  . Bee Venom Anaphylaxis    Current Outpatient Prescriptions on File Prior to Visit  Medication Sig Dispense Refill  . alprazolam (XANAX) 2 MG tablet Take 2 mg by mouth 3 (three) times daily as needed for anxiety.     Marland Kitchen EPINEPHrine 0.3 mg/0.3 mL IJ SOAJ injection Inject 0.3 mLs (0.3 mg total) into the muscle once. 1 Device 0  . levothyroxine (SYNTHROID, LEVOTHROID) 50 MCG tablet Take 1 tablet (50 mcg total) by mouth daily. 90 tablet 3   No current facility-administered medications on file prior to visit.    Past Medical History  Diagnosis Date  . Bipolar affective disorder   . Depression   . Arthritis   . Hypothyroidism   . Genital herpes   . History of chicken pox      Review of Systems  Constitutional: Positive for chills. Negative for fever.  Respiratory: Negative for chest tightness and shortness of breath.   Cardiovascular: Negative for chest pain and leg swelling.  Gastrointestinal:  Positive for nausea, vomiting and constipation. Negative for diarrhea.      Objective:    BP 142/92 mmHg  Pulse 83  Temp(Src) 98.2 F (36.8 C) (Oral)  Resp 18  Ht 5\' 5"  (1.651 m)  Wt 172 lb (78.019 kg)  BMI 28.62 kg/m2  SpO2 97% Nursing note and vital signs reviewed.  Physical Exam  Constitutional: She is oriented to person, place, and time. She appears well-developed and well-nourished. No distress.  Cardiovascular: Normal rate, regular rhythm, normal heart sounds and intact distal pulses.   Pulmonary/Chest: Effort normal and breath sounds normal.  Abdominal: Soft. Normal appearance and bowel sounds are normal. She exhibits no mass. There is no hepatosplenomegaly. There is no tenderness. There is no rigidity, no rebound, no guarding, no tenderness at McBurney's point and negative Murphy's sign.  Neurological: She is alert and oriented to person, place, and time.  Skin: Skin is warm and dry.  Psychiatric: She has a normal mood and affect. Her behavior is normal. Judgment and thought content normal.       Assessment & Plan:   Problem List Items Addressed This Visit      Digestive   Nausea with vomiting - Primary    Nausea and vomiting of undetermined origin. Vital signs stable. Question possible gastritis or constipation. Obtain x-rays to  rule out obstruction. In office injection of ondansetron provided. Start ondansetron disintegrating tablets as needed. Patient instructed to seek further care if unable to consume fluids by tomorrow.      Relevant Medications   ondansetron (ZOFRAN) injection 2 mg (Completed)   ondansetron (ZOFRAN ODT) 4 MG disintegrating tablet   Other Relevant Orders   DG Abd 2 Views

## 2014-11-13 ENCOUNTER — Encounter: Payer: Self-pay | Admitting: Family

## 2014-12-18 NOTE — Anesthesia Preprocedure Evaluation (Addendum)
Anesthesia Evaluation  Patient identified by MRN, date of birth, ID band Patient awake    Reviewed: Allergy & Precautions, NPO status , Patient's Chart, lab work & pertinent test results  History of Anesthesia Complications Negative for: history of anesthetic complications  Airway Mallampati: II  TM Distance: >3 FB Neck ROM: Full    Dental no notable dental hx. (+) Dental Advisory Given   Pulmonary Current Smoker,    Pulmonary exam normal breath sounds clear to auscultation       Cardiovascular negative cardio ROS Normal cardiovascular exam Rhythm:Regular Rate:Normal     Neuro/Psych PSYCHIATRIC DISORDERS Depression Bipolar Disorder negative neurological ROS     GI/Hepatic negative GI ROS, Neg liver ROS,   Endo/Other  Hypothyroidism   Renal/GU negative Renal ROS  negative genitourinary   Musculoskeletal  (+) Arthritis ,   Abdominal   Peds negative pediatric ROS (+)  Hematology  (+) anemia ,   Anesthesia Other Findings   Reproductive/Obstetrics negative OB ROS                            Anesthesia Physical Anesthesia Plan  ASA: II  Anesthesia Plan: General   Post-op Pain Management:    Induction: Intravenous  Airway Management Planned: Oral ETT  Additional Equipment:   Intra-op Plan:   Post-operative Plan: Extubation in OR  Informed Consent: I have reviewed the patients History and Physical, chart, labs and discussed the procedure including the risks, benefits and alternatives for the proposed anesthesia with the patient or authorized representative who has indicated his/her understanding and acceptance.   Dental advisory given  Plan Discussed with:   Anesthesia Plan Comments:        Anesthesia Quick Evaluation

## 2014-12-18 NOTE — H&P (Signed)
[name] is an 54 y.o. female. She was seen as a new patient in March for an annual exam.  She was felt to have a pelvic mass, and pelvic ultrasound in April reveals a mostly simple 10 cm cyst of the left ovary, some small septations, avascular, Ca-125 normal at 24.9.  Plan at that time per Dr. Melba Coon was to repeat ultrasound in 3 months.  Repeat pelvic ultrasound in August reveals a 13 cm cyst of left ovary.  Surgery was advised to remove this large cyst, and she is now ready to proceed with surgery.  She does not want a laparotomy.  Pertinent Gynecological History: Last mammogram: normal Date: 06/2013 Last pap: normal Date: 05/2014 OB History: G2, P1011 LTCS at term   Menstrual History: No LMP recorded. Patient is postmenopausal.    Past Medical History  Diagnosis Date  . Bipolar affective disorder   . Depression   . Arthritis   . Hypothyroidism   . Genital herpes   . History of chicken pox     Past Surgical History  Procedure Laterality Date  . Tonsillectomy  1981  . Hemorrhoid surgery  2006  . Cesarean section  1993  . Tubal ligation  1994  . Colonoscopy      neg    Family History  Problem Relation Age of Onset  . Arthritis Mother   . Cancer Neg Hx   . Diabetes Neg Hx   . Stroke Neg Hx   . Heart disease Neg Hx     Social History:  reports that she has been smoking.  She has never used smokeless tobacco. She reports that she drinks alcohol. She reports that she does not use illicit drugs.  Allergies:  Allergies  Allergen Reactions  . Bee Venom Anaphylaxis    No prescriptions prior to admission    Review of Systems  Respiratory: Negative.   Cardiovascular: Negative.   Gastrointestinal: Negative.   Genitourinary: Negative.     There were no vitals taken for this visit. Physical Exam  Constitutional: She appears well-developed and well-nourished.  Cardiovascular: Normal rate, regular rhythm and normal heart sounds.   No murmur heard. Respiratory:  Effort normal and breath sounds normal. No respiratory distress. She has no wheezes.  GI: Soft. She exhibits no distension and no mass. There is no tenderness.  Genitourinary: Vagina normal.  Pelvic mass, difficult to tell on exam if it is an enlarged uterus or an ovary    No results found for this or any previous visit (from the past 24 hour(s)).  No results found.  Assessment/Plan: Large, mostly simple left ovarian cyst with some small septations, avascular, normal Ca-125.  Surgical procedure and risks have been discussed.  Will admit for open laparoscopy with LSO, will do pelvic washings as well.    [place],TODD D 12/18/2014, 8:40 PM

## 2014-12-19 ENCOUNTER — Encounter (HOSPITAL_COMMUNITY)
Admission: RE | Disposition: A | Discharge status: Home / Self Care | Payer: Self-pay | Source: Ambulatory Visit | Attending: Obstetrics and Gynecology

## 2014-12-19 ENCOUNTER — Encounter (HOSPITAL_COMMUNITY): Payer: Self-pay | Admitting: *Deleted

## 2014-12-19 ENCOUNTER — Ambulatory Visit (HOSPITAL_COMMUNITY)
Admission: RE | Admit: 2014-12-19 | Discharge: 2014-12-19 | Disposition: A | Discharge status: Home / Self Care | Payer: 59 | Source: Ambulatory Visit | Attending: Obstetrics and Gynecology | Admitting: Obstetrics and Gynecology

## 2014-12-19 ENCOUNTER — Ambulatory Visit (HOSPITAL_COMMUNITY): Payer: 59 | Admitting: Anesthesiology

## 2014-12-19 DIAGNOSIS — F17200 Nicotine dependence, unspecified, uncomplicated: Secondary | ICD-10-CM | POA: Insufficient documentation

## 2014-12-19 DIAGNOSIS — Z91030 Bee allergy status: Secondary | ICD-10-CM | POA: Insufficient documentation

## 2014-12-19 DIAGNOSIS — F329 Major depressive disorder, single episode, unspecified: Secondary | ICD-10-CM | POA: Insufficient documentation

## 2014-12-19 DIAGNOSIS — N8320 Unspecified ovarian cysts: Secondary | ICD-10-CM | POA: Diagnosis present

## 2014-12-19 DIAGNOSIS — N83202 Unspecified ovarian cyst, left side: Secondary | ICD-10-CM | POA: Diagnosis present

## 2014-12-19 DIAGNOSIS — D271 Benign neoplasm of left ovary: Secondary | ICD-10-CM | POA: Insufficient documentation

## 2014-12-19 DIAGNOSIS — Z9851 Tubal ligation status: Secondary | ICD-10-CM | POA: Insufficient documentation

## 2014-12-19 DIAGNOSIS — E039 Hypothyroidism, unspecified: Secondary | ICD-10-CM | POA: Diagnosis not present

## 2014-12-19 DIAGNOSIS — F319 Bipolar disorder, unspecified: Secondary | ICD-10-CM | POA: Insufficient documentation

## 2014-12-19 DIAGNOSIS — M1990 Unspecified osteoarthritis, unspecified site: Secondary | ICD-10-CM | POA: Insufficient documentation

## 2014-12-19 HISTORY — PX: LAPAROSCOPIC SALPINGO OOPHERECTOMY: SHX5927

## 2014-12-19 HISTORY — PX: LAPAROSCOPY: SHX197

## 2014-12-19 LAB — CBC
HEMATOCRIT: 39.6 % (ref 36.0–46.0)
Hemoglobin: 12.8 g/dL (ref 12.0–15.0)
MCH: 30.3 pg (ref 26.0–34.0)
MCHC: 32.3 g/dL (ref 30.0–36.0)
MCV: 93.6 fL (ref 78.0–100.0)
Platelets: 323 10*3/uL (ref 150–400)
RBC: 4.23 MIL/uL (ref 3.87–5.11)
RDW: 13.6 % (ref 11.5–15.5)
WBC: 8.6 10*3/uL (ref 4.0–10.5)

## 2014-12-19 SURGERY — LAPAROSCOPY OPERATIVE
Anesthesia: General | Site: Abdomen

## 2014-12-19 MED ORDER — ONDANSETRON HCL 4 MG/2ML IJ SOLN
INTRAMUSCULAR | Status: AC
  Filled 2014-12-19: qty 2

## 2014-12-19 MED ORDER — FENTANYL CITRATE (PF) 100 MCG/2ML IJ SOLN
INTRAMUSCULAR | Status: DC | PRN
Start: 2014-12-19 — End: 2014-12-19
  Administered 2014-12-19 (×3): 50 ug via INTRAVENOUS
  Administered 2014-12-19: 100 ug via INTRAVENOUS

## 2014-12-19 MED ORDER — GLYCOPYRROLATE 0.2 MG/ML IJ SOLN
INTRAMUSCULAR | Status: AC
  Filled 2014-12-19: qty 1

## 2014-12-19 MED ORDER — BUPIVACAINE HCL (PF) 0.25 % IJ SOLN
INTRAMUSCULAR | Status: DC | PRN
  Administered 2014-12-19: 20 mL

## 2014-12-19 MED ORDER — NEOSTIGMINE METHYLSULFATE 10 MG/10ML IV SOLN
INTRAVENOUS | Status: DC | PRN
  Administered 2014-12-19: 4 mg via INTRAVENOUS

## 2014-12-19 MED ORDER — GLYCOPYRROLATE 0.2 MG/ML IJ SOLN
INTRAMUSCULAR | Status: DC | PRN
  Administered 2014-12-19: .6 mg via INTRAVENOUS

## 2014-12-19 MED ORDER — NEOSTIGMINE METHYLSULFATE 10 MG/10ML IV SOLN
INTRAVENOUS | Status: AC
  Filled 2014-12-19: qty 1

## 2014-12-19 MED ORDER — SUCCINYLCHOLINE CHLORIDE 20 MG/ML IJ SOLN
INTRAMUSCULAR | Status: DC | PRN
  Administered 2014-12-19: 120 mg via INTRAVENOUS

## 2014-12-19 MED ORDER — ONDANSETRON HCL 4 MG/2ML IJ SOLN: 4.0000 mg | Freq: Once | INTRAMUSCULAR | Status: DC | PRN

## 2014-12-19 MED ORDER — GLYCOPYRROLATE 0.2 MG/ML IJ SOLN
INTRAMUSCULAR | Status: AC
  Filled 2014-12-19: qty 3

## 2014-12-19 MED ORDER — FENTANYL CITRATE (PF) 250 MCG/5ML IJ SOLN
INTRAMUSCULAR | Status: AC
  Filled 2014-12-19: qty 25

## 2014-12-19 MED ORDER — LACTATED RINGERS IV SOLN
INTRAVENOUS | Status: DC
  Administered 2014-12-19 (×2): via INTRAVENOUS

## 2014-12-19 MED ORDER — ONDANSETRON HCL 4 MG/2ML IJ SOLN
INTRAMUSCULAR | Status: DC | PRN
  Administered 2014-12-19: 4 mg via INTRAVENOUS

## 2014-12-19 MED ORDER — MIDAZOLAM HCL 2 MG/2ML IJ SOLN
INTRAMUSCULAR | Status: DC | PRN
  Administered 2014-12-19: 2 mg via INTRAVENOUS

## 2014-12-19 MED ORDER — OXYCODONE-ACETAMINOPHEN 5-325 MG PO TABS: 1.0000 | ORAL_TABLET | ORAL | Status: DC | PRN

## 2014-12-19 MED ORDER — FENTANYL CITRATE (PF) 100 MCG/2ML IJ SOLN
25.0000 ug | INTRAMUSCULAR | Status: DC | PRN
  Administered 2014-12-19 (×2): 50 ug via INTRAVENOUS

## 2014-12-19 MED ORDER — KETOROLAC TROMETHAMINE 30 MG/ML IJ SOLN
INTRAMUSCULAR | Status: DC | PRN
  Administered 2014-12-19: 30 mg via INTRAVENOUS

## 2014-12-19 MED ORDER — PROPOFOL 10 MG/ML IV BOLUS
INTRAVENOUS | Status: DC | PRN
  Administered 2014-12-19: 200 mg via INTRAVENOUS

## 2014-12-19 MED ORDER — BUPIVACAINE HCL (PF) 0.25 % IJ SOLN
INTRAMUSCULAR | Status: AC
  Filled 2014-12-19: qty 30

## 2014-12-19 MED ORDER — LIDOCAINE HCL (PF) 1 % IJ SOLN
INTRAMUSCULAR | Status: AC
  Filled 2014-12-19: qty 5

## 2014-12-19 MED ORDER — LIDOCAINE HCL (CARDIAC) 20 MG/ML IV SOLN
INTRAVENOUS | Status: DC | PRN
  Administered 2014-12-19: 50 mg via INTRAVENOUS

## 2014-12-19 MED ORDER — ROCURONIUM BROMIDE 100 MG/10ML IV SOLN
INTRAVENOUS | Status: DC | PRN
  Administered 2014-12-19: 10 mg via INTRAVENOUS
  Administered 2014-12-19: 40 mg via INTRAVENOUS

## 2014-12-19 MED ORDER — LACTATED RINGERS IR SOLN
Status: DC | PRN
  Administered 2014-12-19: 3000 mL

## 2014-12-19 MED ORDER — KETOROLAC TROMETHAMINE 30 MG/ML IJ SOLN
INTRAMUSCULAR | Status: AC
  Filled 2014-12-19: qty 1

## 2014-12-19 MED ORDER — PROPOFOL 10 MG/ML IV BOLUS
INTRAVENOUS | Status: AC
  Filled 2014-12-19: qty 20

## 2014-12-19 MED ORDER — DEXAMETHASONE SODIUM PHOSPHATE 10 MG/ML IJ SOLN
INTRAMUSCULAR | Status: DC | PRN
  Administered 2014-12-19: 4 mg via INTRAVENOUS

## 2014-12-19 MED ORDER — FENTANYL CITRATE (PF) 100 MCG/2ML IJ SOLN
INTRAMUSCULAR | Status: AC
  Administered 2014-12-19: 50 ug via INTRAVENOUS
  Filled 2014-12-19: qty 2

## 2014-12-19 MED ORDER — MIDAZOLAM HCL 2 MG/2ML IJ SOLN
INTRAMUSCULAR | Status: AC
  Filled 2014-12-19: qty 4

## 2014-12-19 MED ORDER — SUCCINYLCHOLINE CHLORIDE 20 MG/ML IJ SOLN
INTRAMUSCULAR | Status: AC
  Filled 2014-12-19: qty 1

## 2014-12-19 MED ORDER — DEXAMETHASONE SODIUM PHOSPHATE 4 MG/ML IJ SOLN
INTRAMUSCULAR | Status: AC
  Filled 2014-12-19: qty 1

## 2014-12-19 MED ORDER — SCOPOLAMINE 1 MG/3DAYS TD PT72: 1.0000 | MEDICATED_PATCH | Freq: Once | TRANSDERMAL | Status: DC

## 2014-12-19 SURGICAL SUPPLY — 32 items
CABLE HIGH FREQUENCY MONO STRZ (ELECTRODE) IMPLANT
CATH ROBINSON RED A/P 16FR (CATHETERS) IMPLANT
CHLORAPREP W/TINT 26ML (MISCELLANEOUS) ×4 IMPLANT
CLOTH BEACON ORANGE TIMEOUT ST (SAFETY) ×4 IMPLANT
DECANTER SPIKE VIAL GLASS SM (MISCELLANEOUS) ×4 IMPLANT
DRSG COVADERM PLUS 2X2 (GAUZE/BANDAGES/DRESSINGS) ×8 IMPLANT
DRSG OPSITE POSTOP 3X4 (GAUZE/BANDAGES/DRESSINGS) ×4 IMPLANT
GLOVE BIO SURGEON STRL SZ8 (GLOVE) ×4 IMPLANT
GLOVE ORTHO TXT STRL SZ7.5 (GLOVE) ×4 IMPLANT
GOWN STRL REUS W/TWL 2XL LVL3 (GOWN DISPOSABLE) ×4 IMPLANT
GOWN STRL REUS W/TWL LRG LVL3 (GOWN DISPOSABLE) ×8 IMPLANT
LIQUID BAND (GAUZE/BANDAGES/DRESSINGS) ×4 IMPLANT
NEEDLE EPID 17G 5 ECHO TUOHY (NEEDLE) IMPLANT
NEEDLE INSUFFLATION 120MM (ENDOMECHANICALS) ×4 IMPLANT
NEEDLE SPNL 18GX3.5 QUINCKE PK (NEEDLE) ×4 IMPLANT
NS IRRIG 1000ML POUR BTL (IV SOLUTION) ×4 IMPLANT
PACK LAPAROSCOPY BASIN (CUSTOM PROCEDURE TRAY) ×4 IMPLANT
PAD POSITIONING PINK XL (MISCELLANEOUS) ×4 IMPLANT
POUCH SPECIMEN RETRIEVAL 10MM (ENDOMECHANICALS) ×4 IMPLANT
SET IRRIG TUBING LAPAROSCOPIC (IRRIGATION / IRRIGATOR) ×4 IMPLANT
SHEARS HARMONIC ACE PLUS 36CM (ENDOMECHANICALS) ×4 IMPLANT
SLEEVE XCEL OPT CAN 5 100 (ENDOMECHANICALS) IMPLANT
SOLUTION ELECTROLUBE (MISCELLANEOUS) IMPLANT
SUT VICRYL 0 UR6 27IN ABS (SUTURE) ×8 IMPLANT
SUT VICRYL 4-0 PS2 18IN ABS (SUTURE) ×4 IMPLANT
TOWEL OR 17X24 6PK STRL BLUE (TOWEL DISPOSABLE) ×8 IMPLANT
TRAY FOLEY CATH SILVER 14FR (SET/KITS/TRAYS/PACK) ×4 IMPLANT
TROCAR BALLN 12MMX100 BLUNT (TROCAR) ×4 IMPLANT
TROCAR XCEL NON-BLD 11X100MML (ENDOMECHANICALS) IMPLANT
TROCAR XCEL NON-BLD 5MMX100MML (ENDOMECHANICALS) ×4 IMPLANT
WARMER LAPAROSCOPE (MISCELLANEOUS) ×4 IMPLANT
WATER STERILE IRR 1000ML POUR (IV SOLUTION) IMPLANT

## 2014-12-19 NOTE — Anesthesia Procedure Notes (Signed)
Procedure Name: Intubation Date/Time: 12/19/2014 7:22 AM Performed by: Bufford Spikes Pre-anesthesia Checklist: Patient identified, Patient being monitored, Emergency Drugs available, Timeout performed and Suction available Patient Re-evaluated:Patient Re-evaluated prior to inductionOxygen Delivery Method: Circle system utilized Preoxygenation: Pre-oxygenation with 100% oxygen Intubation Type: IV induction Ventilation: Mask ventilation without difficulty Laryngoscope Size: Miller and 2 Grade View: Grade I Tube type: Oral Tube size: 7.0 mm Number of attempts: 1 Airway Equipment and Method: Stylet Placement Confirmation: ETT inserted through vocal cords under direct vision,  breath sounds checked- equal and bilateral and positive ETCO2 Secured at: 21 cm Tube secured with: Tape Dental Injury: Teeth and Oropharynx as per pre-operative assessment

## 2014-12-19 NOTE — Op Note (Signed)
Obstetrics Op Note 08/31/2014 8:27 AM    Expand All Collapse All   Preoperative diagnosis:  Large left ovarian cyst Postoperative diagnosis: Same Procedure: Open laparoscopy with left salpingo-oophorectomy Surgeon: Cheri Fowler M.D. Assistant:  Paula Compton, MD Anesthesia: Gen. Endotracheal tube Findings: She had a normal uterus, right tube and ovary. Left ovary was significantly enlarged and cystic. I aspirated 960+cc of clear yellow fluid from the cyst.  The remainder of her abdomen appeared normal. Estimated blood loss: Minimal Specimens: Left tube and ovary Complications: None  Procedure in detail  The patient was taken to the operating room and placed in the dorsal supine position. General anesthesia was induced. Both arms were tucked to her side and legs were placed in the mobile stirrups. Abdomen perineum and vagina were then prepped and draped in the usual sterile fashion, bladder drained with a Foley catheter. Infraumbilical skin was then infiltrated with quarter percent Marcaine and a 3 cm horizontal incision was made. Fascia was identified and elevated and entered sharply. Peritoneum was then also elevated and entered sharply. The incision was extended bilaterally. A pursestring suture of 0 Vicryl was placed around the fascia and the Hassan cannula was inserted and secured. Abdomen was insufflated with CO2 and good visualization was achieved. 5 mm ports were placed on each side under direct visualization. Inspection revealed the above-mentioned findings. The cyst was too large to manipulate, so I decided to drain it.  Peritoneal washings were done first.  An 18g spinal needle was inserted through the midline abdominal wall and into the cyst.  Initially I aspirated 900 cc of fluid.  We were then able to manipulate the cyst enough to see the infundibulopelvic pedicle on the left.  This pedicle and the mesosalpinx were taken down with the Harmonic scalpel.  I was then able to come  across the proximal tube and utero-ovarian ligament and free the tube and ovary. The 5 mm scope was then placed through the port on the left and an Endo Catch bag was placed through the umbilical port. I was unable to scoop the ovary into the bag, it was still too big and baggy.  I used the spinal needle and aspirated an additional 60cc of fluid, but the ovary still would not fit.  I inserted a grasper through the umbilical port and grasped the ovary.  I then pulled the ovary to this incision and removed the trocar.  The ovary was then grasped with a Kelly clamp and easily removed through the incision.  The trocar was replaced.  The pelvis was irrigated and pedicles found to be hemostatic. 5 mm ports were removed under direct visualization.  The previously placed pursestring suture was tied and this achieved good fascial approximation.  One additional figure 8 suture of 0 vicryl was placed through the fascia.  Subcutaneous tissue was reapproximated with 2 sutures of 0 Vicryl. All skin incisions were closed with subcuticular sutures of 4-0 Vicryl followed by liqui-band. The foley was removed.  The patient was awakened in the operating room and taken to the recovery room in stable condition after tolerating the procedure well. Counts were correct and she had PAS hose on throughout the procedure.

## 2014-12-19 NOTE — Transfer of Care (Signed)
Immediate Anesthesia Transfer of Care Note  Patient: [name]  Procedure(s) Performed: Procedure(s): LAPAROSCOPY OPERATIVE (N/A) LAPAROSCOPIC LEFT SALPINGO OOPHORECTOMY (Left)  Patient Location: PACU  Anesthesia Type:General  Level of Consciousness: awake, alert  and oriented  Airway & Oxygen Therapy: Patient Spontanous Breathing and Patient connected to nasal cannula oxygen  Post-op Assessment: Report given to RN and Post -op Vital signs reviewed and stable  Post vital signs: Reviewed and stable  Last Vitals:  Filed Vitals:   12/19/14 0609  BP: 106/43  Pulse: 66  Temp: 77.3 C    Complications: No apparent anesthesia complications

## 2014-12-19 NOTE — Interval H&P Note (Signed)
History and Physical Interval Note:  12/19/2014 7:08 AM  [name]  has presented today for surgery, with the diagnosis of Large left ovarian cyst  The various methods of treatment have been discussed with the patient and family. After consideration of risks, benefits and other options for treatment, the patient has consented to  Procedure(s): LAPAROSCOPY OPERATIVE (N/A) LAPAROSCOPIC LEFT SALPINGO OOPHORECTOMY (Left) as a surgical intervention .  The patient's history has been reviewed, patient examined, no change in status, stable for surgery.  I have reviewed the patient's chart and labs.  Questions were answered to the patient's satisfaction.     [facility],TODD D

## 2014-12-19 NOTE — Discharge Instructions (Signed)
DISCHARGE INSTRUCTIONS: Laparoscopy  The following instructions have been prepared to help you care for yourself upon your return home today.  **You may take Ibuprofen/Motrin/Advil/Aleve after 2:30 pm today**  Wound care:  Do not get the incision wet for the first 24 hours. The incision should be kept clean and dry.  The Band-Aids or dressings may be removed the day after surgery.  Should the incision become sore, red, and swollen after the first week, check with your doctor.  Personal hygiene:  Shower the day after your procedure.  Activity and limitations:  Do NOT drive or operate any equipment today.  Do NOT lift anything more than 15 pounds for 2-3 weeks after surgery.  Do NOT rest in bed all day.  Walking is encouraged. Walk each day, starting slowly with 5-minute walks 3 or 4 times a day. Slowly increase the length of your walks.  Walk up and down stairs slowly.  Do NOT do strenuous activities, such as golfing, playing tennis, bowling, running, biking, weight lifting, gardening, mowing, or vacuuming for 2-4 weeks. Ask your doctor when it is okay to start.  Diet: Eat a light meal as desired this evening. You may resume your usual diet tomorrow.  Return to work: This is dependent on the type of work you do. For the most part you can return to a desk job within a week of surgery. If you are more active at work, please discuss this with your doctor.  What to expect after your surgery: You may have a slight burning sensation when you urinate on the first day. You may have a very small amount of blood in the urine. Expect to have a small amount of vaginal discharge/light bleeding for 1-2 weeks. It is not unusual to have abdominal soreness and bruising for up to 2 weeks. You may be tired and need more rest for about 1 week. You may experience shoulder pain for 24-72 hours. Lying flat in bed may relieve it.  Call your doctor for any of the following:  Develop a fever of 100.4 or  greater  Inability to urinate 6 hours after discharge from hospital  Severe pain not relieved by pain medications  Persistent of heavy bleeding at incision site  Redness or swelling around incision site after a week  Increasing nausea or vomiting  Patient Signature________________________________________  Nurse Signature_________________________________________  Support person's signature___________________________________

## 2014-12-19 NOTE — Anesthesia Postprocedure Evaluation (Signed)
  Anesthesia Post-op Note  Patient: [name]  Procedure(s) Performed: Procedure(s) (LRB): LAPAROSCOPY OPERATIVE (N/A) LAPAROSCOPIC LEFT SALPINGO OOPHORECTOMY (Left)  Patient Location: PACU  Anesthesia Type: General  Level of Consciousness: awake and alert   Airway and Oxygen Therapy: Patient Spontanous Breathing  Post-op Pain: mild  Post-op Assessment: Post-op Vital signs reviewed, Patient's Cardiovascular Status Stable, Respiratory Function Stable, Patent Airway and No signs of Nausea or vomiting  Last Vitals:  Filed Vitals:   12/19/14 0945  BP:   Pulse: 67  Temp:   Resp: 28    Post-op Vital Signs: stable   Complications: No apparent anesthesia complications

## 2014-12-20 ENCOUNTER — Encounter (HOSPITAL_COMMUNITY): Payer: Self-pay | Admitting: Obstetrics and Gynecology

## 2015-01-11 ENCOUNTER — Ambulatory Visit (INDEPENDENT_AMBULATORY_CARE_PROVIDER_SITE_OTHER): Payer: 59 | Admitting: Internal Medicine

## 2015-01-11 ENCOUNTER — Other Ambulatory Visit (INDEPENDENT_AMBULATORY_CARE_PROVIDER_SITE_OTHER): Payer: 59

## 2015-01-11 ENCOUNTER — Encounter: Payer: Self-pay | Admitting: Internal Medicine

## 2015-01-11 VITALS — BP 118/68 | HR 73 | Temp 98.3°F | Resp 16 | Ht 65.5 in | Wt 172.0 lb

## 2015-01-11 DIAGNOSIS — E039 Hypothyroidism, unspecified: Secondary | ICD-10-CM

## 2015-01-11 DIAGNOSIS — Z0000 Encounter for general adult medical examination without abnormal findings: Secondary | ICD-10-CM

## 2015-01-11 DIAGNOSIS — Z23 Encounter for immunization: Secondary | ICD-10-CM

## 2015-01-11 LAB — CBC
HCT: 40.4 % (ref 36.0–46.0)
HEMOGLOBIN: 13.2 g/dL (ref 12.0–15.0)
MCHC: 32.6 g/dL (ref 30.0–36.0)
MCV: 90.9 fl (ref 78.0–100.0)
PLATELETS: 313 10*3/uL (ref 150.0–400.0)
RBC: 4.44 Mil/uL (ref 3.87–5.11)
RDW: 13.8 % (ref 11.5–15.5)
WBC: 6.7 10*3/uL (ref 4.0–10.5)

## 2015-01-11 LAB — COMPREHENSIVE METABOLIC PANEL
ALBUMIN: 4 g/dL (ref 3.5–5.2)
ALK PHOS: 84 U/L (ref 39–117)
ALT: 11 U/L (ref 0–35)
AST: 12 U/L (ref 0–37)
BILIRUBIN TOTAL: 0.4 mg/dL (ref 0.2–1.2)
BUN: 17 mg/dL (ref 6–23)
CALCIUM: 9.7 mg/dL (ref 8.4–10.5)
CO2: 25 mEq/L (ref 19–32)
Chloride: 105 mEq/L (ref 96–112)
Creatinine, Ser: 0.77 mg/dL (ref 0.40–1.20)
GFR: 82.42 mL/min (ref >60.00)
Glucose, Bld: 92 mg/dL (ref 70–99)
POTASSIUM: 3.4 meq/L — AB (ref 3.5–5.1)
Sodium: 140 mEq/L (ref 135–145)
TOTAL PROTEIN: 6.9 g/dL (ref 6.0–8.3)

## 2015-01-11 LAB — LIPID PANEL
CHOLESTEROL: 262 mg/dL — AB (ref 0–200)
HDL: 42.8 mg/dL (ref >39.00)
LDL Cholesterol: 196 mg/dL — ABNORMAL HIGH (ref 0–99)
NonHDL: 218.89
TRIGLYCERIDES: 112 mg/dL (ref 0.0–149.0)
Total CHOL/HDL Ratio: 6
VLDL: 22.4 mg/dL (ref 0.0–40.0)

## 2015-01-11 LAB — TSH: TSH: 2.17 u[IU]/mL (ref 0.35–4.50)

## 2015-01-11 MED ORDER — BUPROPION HCL ER (XL) 150 MG PO TB24: 150.0000 mg | ORAL_TABLET | Freq: Every morning | ORAL | Status: DC

## 2015-01-11 MED ORDER — EPINEPHRINE 0.3 MG/0.3ML IJ SOAJ: 0.3000 mg | Freq: Once | INTRAMUSCULAR | Status: DC

## 2015-01-11 MED ORDER — ALPRAZOLAM 2 MG PO TABS: 2.0000 mg | ORAL_TABLET | Freq: Three times a day (TID) | ORAL | Status: DC | PRN

## 2015-01-11 NOTE — Progress Notes (Signed)
   Subjective:    Patient ID: [name], female    DOB: 07-Nov-1960, 54 y.o.   MRN: [id number]  HPI The patient is a 54 YO female coming in for wellness. Recent surgery for large ovarian cyst.   PMH, [place], social history reviewed and updated.   Review of Systems  Constitutional: Negative.   HENT: Negative.   Eyes: Negative.   Respiratory: Negative.   Cardiovascular: Negative.   Gastrointestinal: Negative.   Musculoskeletal: Positive for arthralgias. Negative for myalgias, back pain and gait problem.  Skin: Negative.   Neurological: Negative.   Psychiatric/Behavioral: Negative.       Objective:   Physical Exam  Constitutional: She is oriented to person, place, and time. She appears well-developed and well-nourished.  HENT:  Head: Normocephalic and atraumatic.  Eyes: EOM are normal.  Neck: Normal range of motion.  Cardiovascular: Normal rate and regular rhythm.   Pulmonary/Chest: Effort normal and breath sounds normal. No respiratory distress. She has no wheezes. She has no rales.  Abdominal: Soft. Bowel sounds are normal.  Neurological: She is alert and oriented to person, place, and time. Coordination normal.  Skin: Skin is warm and dry.  Psychiatric: She has a normal mood and affect.   Filed Vitals:   01/11/15 0831  BP: 118/68  Pulse: 73  Temp: 98.3 F (36.8 C)  TempSrc: Oral  Resp: 16  Height: 5' 5.5" (1.664 m)  Weight: 172 lb (78.019 kg)  SpO2: 99%      Assessment & Plan:  Flu shot given at visit.

## 2015-01-11 NOTE — Progress Notes (Signed)
Pre visit review using our clinic review tool, if applicable. No additional management support is needed unless otherwise documented below in the visit note. 

## 2015-01-11 NOTE — Assessment & Plan Note (Signed)
Flu shot given at visit, counseled her to stop smoking but she does not feel able at this time. Checking labs. Reviewed overdue health maintenance with her at visit.

## 2015-01-11 NOTE — Patient Instructions (Signed)
We will check the blood work today and call you back with the results.   Health Maintenance, Female Adopting a healthy lifestyle and getting preventive care can go a long way to promote health and wellness. Talk with your health care provider about what schedule of regular examinations is right for you. This is a good chance for you to check in with your provider about disease prevention and staying healthy. In between checkups, there are plenty of things you can do on your own. Experts have done a lot of research about which lifestyle changes and preventive measures are most likely to keep you healthy. Ask your health care provider for more information. WEIGHT AND DIET  Eat a healthy diet  Be sure to include plenty of vegetables, fruits, low-fat dairy products, and lean protein.  Do not eat a lot of foods high in solid fats, added sugars, or salt.  Get regular exercise. This is one of the most important things you can do for your health.  Most adults should exercise for at least 150 minutes each week. The exercise should increase your heart rate and make you sweat (moderate-intensity exercise).  Most adults should also do strengthening exercises at least twice a week. This is in addition to the moderate-intensity exercise.  Maintain a healthy weight  Body mass index (BMI) is a measurement that can be used to identify possible weight problems. It estimates body fat based on height and weight. Your health care provider can help determine your BMI and help you achieve or maintain a healthy weight.  For females 9 years of age and older:   A BMI below 18.5 is considered underweight.  A BMI of 18.5 to 24.9 is normal.  A BMI of 25 to 29.9 is considered overweight.  A BMI of 30 and above is considered obese.  Watch levels of cholesterol and blood lipids  You should start having your blood tested for lipids and cholesterol at 56 years of age, then have this test every 5 years.  You may  need to have your cholesterol levels checked more often if:  Your lipid or cholesterol levels are high.  You are older than 56 years of age.  You are at high risk for heart disease.  CANCER SCREENING   Lung Cancer  Lung cancer screening is recommended for adults 68-6 years old who are at high risk for lung cancer because of a history of smoking.  A yearly low-dose CT scan of the lungs is recommended for people who:  Currently smoke.  Have quit within the past 15 years.  Have at least a 30-pack-year history of smoking. A pack year is smoking an average of one pack of cigarettes a day for 1 year.  Yearly screening should continue until it has been 15 years since you quit.  Yearly screening should stop if you develop a health problem that would prevent you from having lung cancer treatment.  Breast Cancer  Practice breast self-awareness. This means understanding how your breasts normally appear and feel.  It also means doing regular breast self-exams. Let your health care provider know about any changes, no matter how small.  If you are in your 20s or 30s, you should have a clinical breast exam (CBE) by a health care provider every 1-3 years as part of a regular health exam.  If you are 24 or older, have a CBE every year. Also consider having a breast X-ray (mammogram) every year.  If you have a family  history of breast cancer, talk to your health care provider about genetic screening.  If you are at high risk for breast cancer, talk to your health care provider about having an MRI and a mammogram every year.  Breast cancer gene (BRCA) assessment is recommended for women who have family members with BRCA-related cancers. BRCA-related cancers include:  Breast.  Ovarian.  Tubal.  Peritoneal cancers.  Results of the assessment will determine the need for genetic counseling and BRCA1 and BRCA2 testing. Cervical Cancer Your health care provider may recommend that you be  screened regularly for cancer of the pelvic organs (ovaries, uterus, and vagina). This screening involves a pelvic examination, including checking for microscopic changes to the surface of your cervix (Pap test). You may be encouraged to have this screening done every 3 years, beginning at age 38.  For women ages 83-65, health care providers may recommend pelvic exams and Pap testing every 3 years, or they may recommend the Pap and pelvic exam, combined with testing for human papilloma virus (HPV), every 5 years. Some types of HPV increase your risk of cervical cancer. Testing for HPV may also be done on women of any age with unclear Pap test results.  Other health care providers may not recommend any screening for nonpregnant women who are considered low risk for pelvic cancer and who do not have symptoms. Ask your health care provider if a screening pelvic exam is right for you.  If you have had past treatment for cervical cancer or a condition that could lead to cancer, you need Pap tests and screening for cancer for at least 20 years after your treatment. If Pap tests have been discontinued, your risk factors (such as having a new sexual partner) need to be reassessed to determine if screening should resume. Some women have medical problems that increase the chance of getting cervical cancer. In these cases, your health care provider may recommend more frequent screening and Pap tests. Colorectal Cancer  This type of cancer can be detected and often prevented.  Routine colorectal cancer screening usually begins at 56 years of age and continues through 56 years of age.  Your health care provider may recommend screening at an earlier age if you have risk factors for colon cancer.  Your health care provider may also recommend using home test kits to check for hidden blood in the stool.  A small camera at the end of a tube can be used to examine your colon directly (sigmoidoscopy or colonoscopy).  This is done to check for the earliest forms of colorectal cancer.  Routine screening usually begins at age 52.  Direct examination of the colon should be repeated every 5-10 years through 56 years of age. However, you may need to be screened more often if early forms of precancerous polyps or small growths are found. Skin Cancer  Check your skin from head to toe regularly.  Tell your health care provider about any new moles or changes in moles, especially if there is a change in a mole's shape or color.  Also tell your health care provider if you have a mole that is larger than the size of a pencil eraser.  Always use sunscreen. Apply sunscreen liberally and repeatedly throughout the day.  Protect yourself by wearing long sleeves, pants, a wide-brimmed hat, and sunglasses whenever you are outside. HEART DISEASE, DIABETES, AND HIGH BLOOD PRESSURE   High blood pressure causes heart disease and increases the risk of stroke. High blood pressure  is more likely to develop in:  People who have blood pressure in the high end of the normal range (130-139/85-89 mm Hg).  People who are overweight or obese.  People who are African American.  If you are 105-22 years of age, have your blood pressure checked every 3-5 years. If you are 70 years of age or older, have your blood pressure checked every year. You should have your blood pressure measured twice--once when you are at a hospital or clinic, and once when you are not at a hospital or clinic. Record the average of the two measurements. To check your blood pressure when you are not at a hospital or clinic, you can use:  An automated blood pressure machine at a pharmacy.  A home blood pressure monitor.  If you are between 41 years and 15 years old, ask your health care provider if you should take aspirin to prevent strokes.  Have regular diabetes screenings. This involves taking a blood sample to check your fasting blood sugar level.  If you  are at a normal weight and have a low risk for diabetes, have this test once every three years after 56 years of age.  If you are overweight and have a high risk for diabetes, consider being tested at a younger age or more often. PREVENTING INFECTION  Hepatitis B  If you have a higher risk for hepatitis B, you should be screened for this virus. You are considered at high risk for hepatitis B if:  You were born in a country where hepatitis B is common. Ask your health care provider which countries are considered high risk.  Your parents were born in a high-risk country, and you have not been immunized against hepatitis B (hepatitis B vaccine).  You have HIV or AIDS.  You use needles to inject street drugs.  You live with someone who has hepatitis B.  You have had sex with someone who has hepatitis B.  You get hemodialysis treatment.  You take certain medicines for conditions, including cancer, organ transplantation, and autoimmune conditions. Hepatitis C  Blood testing is recommended for:  Everyone born from [birth year] through 1967.  Anyone with known risk factors for hepatitis C. Sexually transmitted infections (STIs)  You should be screened for sexually transmitted infections (STIs) including gonorrhea and chlamydia if:  You are sexually active and are younger than 56 years of age.  You are older than 55 years of age and your health care provider tells you that you are at risk for this type of infection.  Your sexual activity has changed since you were last screened and you are at an increased risk for chlamydia or gonorrhea. Ask your health care provider if you are at risk.  If you do not have HIV, but are at risk, it may be recommended that you take a prescription medicine daily to prevent HIV infection. This is called pre-exposure prophylaxis (PrEP). You are considered at risk if:  You are sexually active and do not regularly use condoms or know the HIV status of your  partner(s).  You take drugs by injection.  You are sexually active with a partner who has HIV. Talk with your health care provider about whether you are at high risk of being infected with HIV. If you choose to begin PrEP, you should first be tested for HIV. You should then be tested every 3 months for as long as you are taking PrEP.  PREGNANCY   If you are premenopausal and  you may become pregnant, ask your health care provider about preconception counseling.  If you may become pregnant, take 400 to 800 micrograms (mcg) of folic acid every day.  If you want to prevent pregnancy, talk to your health care provider about birth control (contraception). OSTEOPOROSIS AND MENOPAUSE   Osteoporosis is a disease in which the bones lose minerals and strength with aging. This can result in serious bone fractures. Your risk for osteoporosis can be identified using a bone density scan.  If you are 53 years of age or older, or if you are at risk for osteoporosis and fractures, ask your health care provider if you should be screened.  Ask your health care provider whether you should take a calcium or vitamin D supplement to lower your risk for osteoporosis.  Menopause may have certain physical symptoms and risks.  Hormone replacement therapy may reduce some of these symptoms and risks. Talk to your health care provider about whether hormone replacement therapy is right for you.  HOME CARE INSTRUCTIONS   Schedule regular health, dental, and eye exams.  Stay current with your immunizations.   Do not use any tobacco products including cigarettes, chewing tobacco, or electronic cigarettes.  If you are pregnant, do not drink alcohol.  If you are breastfeeding, limit how much and how often you drink alcohol.  Limit alcohol intake to no more than 1 drink per day for nonpregnant women. One drink equals 12 ounces of beer, 5 ounces of wine, or 1 ounces of hard liquor.  Do not use street drugs.  Do  not share needles.  Ask your health care provider for help if you need support or information about quitting drugs.  Tell your health care provider if you often feel depressed.  Tell your health care provider if you have ever been abused or do not feel safe at home.   This information is not intended to replace advice given to you by your health care provider. Make sure you discuss any questions you have with your health care provider.   Document Released: 09/22/2010 Document Revised: 03/30/2014 Document Reviewed: 02/08/2013 Elsevier Interactive Patient Education [employer].

## 2015-02-12 ENCOUNTER — Other Ambulatory Visit: Payer: Self-pay | Admitting: Internal Medicine

## 2015-09-01 ENCOUNTER — Emergency Department (HOSPITAL_COMMUNITY)
Admission: EM | Admit: 2015-09-01 | Discharge: 2015-09-02 | Disposition: A | Discharge status: Home / Self Care | Payer: BLUE CROSS/BLUE SHIELD | Attending: Emergency Medicine | Admitting: Emergency Medicine

## 2015-09-01 ENCOUNTER — Encounter (HOSPITAL_COMMUNITY): Payer: Self-pay | Admitting: Emergency Medicine

## 2015-09-01 DIAGNOSIS — E039 Hypothyroidism, unspecified: Secondary | ICD-10-CM | POA: Diagnosis not present

## 2015-09-01 DIAGNOSIS — M1990 Unspecified osteoarthritis, unspecified site: Secondary | ICD-10-CM | POA: Insufficient documentation

## 2015-09-01 DIAGNOSIS — F329 Major depressive disorder, single episode, unspecified: Secondary | ICD-10-CM | POA: Insufficient documentation

## 2015-09-01 DIAGNOSIS — F17200 Nicotine dependence, unspecified, uncomplicated: Secondary | ICD-10-CM | POA: Insufficient documentation

## 2015-09-01 DIAGNOSIS — M549 Dorsalgia, unspecified: Secondary | ICD-10-CM | POA: Diagnosis present

## 2015-09-01 DIAGNOSIS — Z5321 Procedure and treatment not carried out due to patient leaving prior to being seen by health care provider: Secondary | ICD-10-CM | POA: Diagnosis not present

## 2015-09-01 DIAGNOSIS — Z79899 Other long term (current) drug therapy: Secondary | ICD-10-CM | POA: Diagnosis not present

## 2015-09-01 HISTORY — DX: Dorsalgia, unspecified: M54.9
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
## 2015-09-02 NOTE — ED Provider Notes (Signed)
Patient left without being seen. See nursing notes.   [facility] Ward, PA-C 09/02/15 [phone]

## 2015-09-02 NOTE — ED Notes (Signed)
Pt came out of her room cursing at [place], RT.  This Probation officer did hear the pt.  Pt stated, "I can hurt at home, ain't no one came to see me in three hours."  Did hourly round on this pt while she was in my care.  Upon introduction to pt this Probation officer stated that there is an extended wait as the department is very busy.  Gave pt the call light and asked that she call if she needed anything. Will d/c AMA, pt refused to sign AMA.

## 2015-09-02 NOTE — ED Notes (Signed)
Pt seen leaving dept stating "I have been in my room for 3 hours and nothing was done, nothing!"

## 2015-09-04 ENCOUNTER — Encounter (HOSPITAL_COMMUNITY): Payer: Self-pay | Admitting: Emergency Medicine

## 2015-09-04 ENCOUNTER — Emergency Department (HOSPITAL_COMMUNITY)
Admission: EM | Admit: 2015-09-04 | Discharge: 2015-09-04 | Disposition: A | Discharge status: Home / Self Care | Payer: BLUE CROSS/BLUE SHIELD | Attending: Emergency Medicine | Admitting: Emergency Medicine

## 2015-09-04 DIAGNOSIS — F17200 Nicotine dependence, unspecified, uncomplicated: Secondary | ICD-10-CM | POA: Diagnosis not present

## 2015-09-04 DIAGNOSIS — M545 Low back pain, unspecified: Secondary | ICD-10-CM

## 2015-09-04 DIAGNOSIS — Z79899 Other long term (current) drug therapy: Secondary | ICD-10-CM | POA: Diagnosis not present

## 2015-09-04 MED ORDER — BUPIVACAINE HCL 0.5 % IJ SOLN
20.0000 mL | Freq: Once | INTRAMUSCULAR | Status: DC
  Filled 2015-09-04: qty 20

## 2015-09-04 MED ORDER — BUPIVACAINE HCL (PF) 0.5 % IJ SOLN: 20.0000 mL | Freq: Once | INTRAMUSCULAR | Status: DC

## 2015-09-04 MED ORDER — NAPROXEN 500 MG PO TABS: 500.0000 mg | ORAL_TABLET | Freq: Two times a day (BID) | ORAL | Status: DC

## 2015-09-04 NOTE — ED Notes (Signed)
Bed: WA17 Expected date:  Expected time:  Means of arrival:  Comments: EMS back pain 

## 2015-09-04 NOTE — Discharge Instructions (Signed)
Back Exercises The following exercises strengthen the muscles that help to support the back. They also help to keep the lower back flexible. Doing these exercises can help to prevent back pain or lessen existing pain. If you have back pain or discomfort, try doing these exercises 2-3 times each day or as told by your health care provider. When the pain goes away, do them once each day, but increase the number of times that you repeat the steps for each exercise (do more repetitions). If you do not have back pain or discomfort, do these exercises once each day or as told by your health care provider. EXERCISES Single Knee to Chest Repeat these steps 3-5 times for each leg:  Lie on your back on a firm bed or the floor with your legs extended.  Bring one knee to your chest. Your other leg should stay extended and in contact with the floor.  Hold your knee in place by grabbing your knee or thigh.  Pull on your knee until you feel a gentle stretch in your lower back.  Hold the stretch for 10-30 seconds.  Slowly release and straighten your leg. Pelvic Tilt Repeat these steps 5-10 times:  Lie on your back on a firm bed or the floor with your legs extended.  Bend your knees so they are pointing toward the ceiling and your feet are flat on the floor.  Tighten your lower abdominal muscles to press your lower back against the floor. This motion will tilt your pelvis so your tailbone points up toward the ceiling instead of pointing to your feet or the floor.  With gentle tension and even breathing, hold this position for 5-10 seconds. Cat-Cow Repeat these steps until your lower back becomes more flexible:  Get into a hands-and-knees position on a firm surface. Keep your hands under your shoulders, and keep your knees under your hips. You may place padding under your knees for comfort.  Let your head hang down, and point your tailbone toward the floor so your lower back becomes rounded like the  back of a cat.  Hold this position for 5 seconds.  Slowly lift your head and point your tailbone up toward the ceiling so your back forms a sagging arch like the back of a cow.  Hold this position for 5 seconds. Press-Ups Repeat these steps 5-10 times:  Lie on your abdomen (face-down) on the floor.  Place your palms near your head, about shoulder-width apart.  While you keep your back as relaxed as possible and keep your hips on the floor, slowly straighten your arms to raise the top half of your body and lift your shoulders. Do not use your back muscles to raise your upper torso. You may adjust the placement of your hands to make yourself more comfortable.  Hold this position for 5 seconds while you keep your back relaxed.  Slowly return to lying flat on the floor. Bridges Repeat these steps 10 times:  Lie on your back on a firm surface.  Bend your knees so they are pointing toward the ceiling and your feet are flat on the floor.  Tighten your buttocks muscles and lift your buttocks off of the floor until your waist is at almost the same height as your knees. You should feel the muscles working in your buttocks and the back of your thighs. If you do not feel these muscles, slide your feet 1-2 inches farther away from your buttocks.  Hold this position for 3-5  seconds.  Slowly lower your hips to the starting position, and allow your buttocks muscles to relax completely. If this exercise is too easy, try doing it with your arms crossed over your chest. Abdominal Crunches Repeat these steps 5-10 times:  Lie on your back on a firm bed or the floor with your legs extended.  Bend your knees so they are pointing toward the ceiling and your feet are flat on the floor.  Cross your arms over your chest.  Tip your chin slightly toward your chest without bending your neck.  Tighten your abdominal muscles and slowly raise your trunk (torso) high enough to lift your shoulder blades a  tiny bit off of the floor. Avoid raising your torso higher than that, because it can put too much stress on your low back and it does not help to strengthen your abdominal muscles.  Slowly return to your starting position. Back Lifts Repeat these steps 5-10 times: 1. Lie on your abdomen (face-down) with your arms at your sides, and rest your forehead on the floor. 2. Tighten the muscles in your legs and your buttocks. 3. Slowly lift your chest off of the floor while you keep your hips pressed to the floor. Keep the back of your head in line with the curve in your back. Your eyes should be looking at the floor. 4. Hold this position for 3-5 seconds. 5. Slowly return to your starting position. SEEK MEDICAL CARE IF:  Your back pain or discomfort gets much worse when you do an exercise.  Your back pain or discomfort does not lessen within 2 hours after you exercise. If you have any of these problems, stop doing these exercises right away. Do not do them again unless your health care provider says that you can. SEEK IMMEDIATE MEDICAL CARE IF:  You develop sudden, severe back pain. If this happens, stop doing the exercises right away. Do not do them again unless your health care provider says that you can.   This information is not intended to replace advice given to you by your health care provider. Make sure you discuss any questions you have with your health care provider.   Document Released: 04/16/2004 Document Revised: 11/28/2014 Document Reviewed: 05/03/2014 Elsevier Interactive Patient Education 2016 [employer] Back Injury Prevention Back injuries can be very painful. They can also be difficult to heal. After having one back injury, you are more likely to injure your back again. It is important to learn how to avoid injuring or re-injuring your back. The following tips can help you to prevent a back injury. WHAT SHOULD I KNOW ABOUT PHYSICAL FITNESS?  Exercise for 30 minutes per day  on most days of the week or as directed by your health care provider. Make sure to:  Do aerobic exercises, such as walking, jogging, biking, or swimming.  Do exercises that increase balance and strength, such as tai chi and yoga. These can decrease your risk of falling and injuring your back.  Do stretching exercises to help with flexibility.  Try to develop strong abdominal muscles. Your abdominal muscles provide a lot of the support that is needed by your back.  Maintain a healthy weight. This helps to decrease your risk of a back injury. WHAT SHOULD I KNOW ABOUT MY DIET?  Talk with your health care provider about your overall diet. Take supplements and vitamins only as directed by your health care provider.  Talk with your health care provider about how much calcium and vitamin   D you need each day. These nutrients help to prevent weakening of the bones (osteoporosis). Osteoporosis can cause broken (fractured) bones, which lead to back pain.  Include good sources of calcium in your diet, such as dairy products, green leafy vegetables, and products that have had calcium added to them (fortified).  Include good sources of vitamin D in your diet, such as milk and foods that are fortified with vitamin D. WHAT SHOULD I KNOW ABOUT MY POSTURE?  Sit up straight and stand up straight. Avoid leaning forward when you sit or hunching over when you stand.  Choose chairs that have good low-back (lumbar) support.  If you work at a desk, sit close to it so you do not need to lean over. Keep your chin tucked in. Keep your neck drawn back, and keep your elbows bent at a right angle. Your arms should look like the letter "L."  Sit high and close to the steering wheel when you drive. Add a lumbar support to your car seat, if needed.  Avoid sitting or standing in one position for very long. Take breaks to get up, stretch, and walk around at least one time every hour. Take breaks every hour if you are  driving for long periods of time.  Sleep on your side with your knees slightly bent, or sleep on your back with a pillow under your knees. Do not lie on the front of your body to sleep. WHAT SHOULD I KNOW ABOUT LIFTING, TWISTING, AND REACHING? Lifting and Heavy Lifting  Avoid heavy lifting, especially repetitive heavy lifting. If you must do heavy lifting:  Stretch before lifting.  Work slowly.  Rest between lifts.  Use a tool such as a cart or a dolly to move objects if one is available.  Make several small trips instead of carrying one heavy load.  Ask for help when you need it, especially when moving big objects.  Follow these steps when lifting:  Stand with your feet shoulder-width apart.  Get as close to the object as you can. Do not try to pick up a heavy object that is far from your body.  Use handles or lifting straps if they are available.  Bend at your knees. Squat down, but keep your heels off the floor.  Keep your shoulders pulled back, your chin tucked in, and your back straight.  Lift the object slowly while you tighten the muscles in your legs, abdomen, and buttocks. Keep the object as close to the center of your body as possible.  Follow these steps when putting down a heavy load:  Stand with your feet shoulder-width apart.  Lower the object slowly while you tighten the muscles in your legs, abdomen, and buttocks. Keep the object as close to the center of your body as possible.  Keep your shoulders pulled back, your chin tucked in, and your back straight.  Bend at your knees. Squat down, but keep your heels off the floor.  Use handles or lifting straps if they are available. Twisting and Reaching  Avoid lifting heavy objects above your waist.  Do not twist at your waist while you are lifting or carrying a load. If you need to turn, move your feet.  Do not bend over without bending at your knees.  Avoid reaching over your head, across a table, or  for an object on a high surface. WHAT ARE SOME OTHER TIPS?  Avoid wet floors and icy ground. Keep sidewalks clear of ice to prevent falls.    falls.  Do not sleep on a mattress that is too soft or too hard.  Keep items that are used frequently within easy reach.  Put heavier objects on shelves at waist level, and put lighter objects on lower or higher shelves.  Find ways to decrease your stress, such as exercise, massage, or relaxation techniques. Stress can build up in your muscles. Tense muscles are more vulnerable to injury.  Talk with your health care provider if you feel anxious or depressed. These conditions can make back pain worse.  Wear flat heel shoes with cushioned soles.  Avoid sudden movements.  Use both shoulder straps when carrying a backpack.  Do not use any tobacco products, including cigarettes, chewing tobacco, or electronic cigarettes. If you need help quitting, ask your health care provider.   This information is not intended to replace advice given to you by your health care provider. Make sure you discuss any questions you have with your health care provider.   Document Released: 04/16/2004 Document Revised: 07/24/2014 Document Reviewed: 03/13/2014 Elsevier Interactive Patient Education [employer].

## 2015-09-04 NOTE — ED Notes (Signed)
Per EMS, pt from home c/o progressively worsening back pain x 2 days. Hx of L4 herniation.
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
## 2016-01-13 ENCOUNTER — Encounter: Payer: Self-pay | Admitting: Internal Medicine

## 2016-01-15 ENCOUNTER — Other Ambulatory Visit: Payer: Self-pay | Admitting: Internal Medicine

## 2016-01-17 ENCOUNTER — Telehealth: Payer: Self-pay | Admitting: Emergency Medicine

## 2016-01-17 ENCOUNTER — Encounter: Payer: Self-pay | Admitting: Internal Medicine

## 2016-01-17 ENCOUNTER — Ambulatory Visit (INDEPENDENT_AMBULATORY_CARE_PROVIDER_SITE_OTHER)
Admission: RE | Admit: 2016-01-17 | Discharge: 2016-01-17 | Disposition: A | Discharge status: Home / Self Care | Payer: BLUE CROSS/BLUE SHIELD | Source: Ambulatory Visit | Attending: Internal Medicine | Admitting: Internal Medicine

## 2016-01-17 ENCOUNTER — Other Ambulatory Visit: Payer: Self-pay | Admitting: Geriatric Medicine

## 2016-01-17 ENCOUNTER — Other Ambulatory Visit (INDEPENDENT_AMBULATORY_CARE_PROVIDER_SITE_OTHER): Payer: BLUE CROSS/BLUE SHIELD

## 2016-01-17 ENCOUNTER — Ambulatory Visit (INDEPENDENT_AMBULATORY_CARE_PROVIDER_SITE_OTHER): Payer: BLUE CROSS/BLUE SHIELD | Admitting: Internal Medicine

## 2016-01-17 VITALS — BP 98/58 | HR 110 | Temp 98.1°F | Resp 16 | Ht 65.0 in | Wt 168.0 lb

## 2016-01-17 DIAGNOSIS — F17200 Nicotine dependence, unspecified, uncomplicated: Secondary | ICD-10-CM | POA: Diagnosis not present

## 2016-01-17 DIAGNOSIS — R7989 Other specified abnormal findings of blood chemistry: Secondary | ICD-10-CM | POA: Diagnosis not present

## 2016-01-17 DIAGNOSIS — Z23 Encounter for immunization: Secondary | ICD-10-CM

## 2016-01-17 DIAGNOSIS — Z0000 Encounter for general adult medical examination without abnormal findings: Secondary | ICD-10-CM

## 2016-01-17 DIAGNOSIS — E039 Hypothyroidism, unspecified: Secondary | ICD-10-CM | POA: Diagnosis not present

## 2016-01-17 LAB — LIPID PANEL
CHOL/HDL RATIO: 4
Cholesterol: 219 mg/dL — ABNORMAL HIGH (ref 0–200)
HDL: 49 mg/dL (ref >39.00)
NonHDL: 169.74
Triglycerides: 216 mg/dL — ABNORMAL HIGH (ref 0.0–149.0)
VLDL: 43.2 mg/dL — AB (ref 0.0–40.0)

## 2016-01-17 LAB — HEMOGLOBIN A1C: HEMOGLOBIN A1C: 5.1 % (ref 4.6–6.5)

## 2016-01-17 LAB — LDL CHOLESTEROL, DIRECT: LDL DIRECT: 145 mg/dL

## 2016-01-17 MED ORDER — EPINEPHRINE 0.3 MG/0.3ML IJ SOAJ: 0.3000 mg | Freq: Once | INTRAMUSCULAR | 3 refills | Status: AC

## 2016-01-17 NOTE — Telephone Encounter (Signed)
Sent to pharmacy 
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
## 2016-01-17 NOTE — Telephone Encounter (Signed)
Pt wants me to remind you to refill her EPINEPHrine 0.3 mg/0.3 mL IJ SOAJ injection. I was last filled over a year ago. Thanks.

## 2016-01-17 NOTE — Progress Notes (Signed)
Pre visit review using our clinic review tool, if applicable. No additional management support is needed unless otherwise documented below in the visit note. 

## 2016-01-17 NOTE — Assessment & Plan Note (Signed)
Smoking 1/2 ppd and not willing to quit at this time.

## 2016-01-17 NOTE — Patient Instructions (Signed)
We are checking the labs today and the chest x-ray today.   I hope that everything goes well with the disability stuff so that you can get back to doing more.   Health Maintenance, Female Adopting a healthy lifestyle and getting preventive care can go a long way to promote health and wellness. Talk with your health care provider about what schedule of regular examinations is right for you. This is a good chance for you to check in with your provider about disease prevention and staying healthy. In between checkups, there are plenty of things you can do on your own. Experts have done a lot of research about which lifestyle changes and preventive measures are most likely to keep you healthy. Ask your health care provider for more information. WEIGHT AND DIET  Eat a healthy diet  Be sure to include plenty of vegetables, fruits, low-fat dairy products, and lean protein.  Do not eat a lot of foods high in solid fats, added sugars, or salt.  Get regular exercise. This is one of the most important things you can do for your health.  Most adults should exercise for at least 150 minutes each week. The exercise should increase your heart rate and make you sweat (moderate-intensity exercise).  Most adults should also do strengthening exercises at least twice a week. This is in addition to the moderate-intensity exercise.  Maintain a healthy weight  Body mass index (BMI) is a measurement that can be used to identify possible weight problems. It estimates body fat based on height and weight. Your health care provider can help determine your BMI and help you achieve or maintain a healthy weight.  For females 39 years of age and older:   A BMI below 18.5 is considered underweight.  A BMI of 18.5 to 24.9 is normal.  A BMI of 25 to 29.9 is considered overweight.  A BMI of 30 and above is considered obese.  Watch levels of cholesterol and blood lipids  You should start having your blood tested for  lipids and cholesterol at 57 years of age, then have this test every 5 years.  You may need to have your cholesterol levels checked more often if:  Your lipid or cholesterol levels are high.  You are older than 57 years of age.  You are at high risk for heart disease.  CANCER SCREENING   Lung Cancer  Lung cancer screening is recommended for adults 69-22 years old who are at high risk for lung cancer because of a history of smoking.  A yearly low-dose CT scan of the lungs is recommended for people who:  Currently smoke.  Have quit within the past 15 years.  Have at least a 30-pack-year history of smoking. A pack year is smoking an average of one pack of cigarettes a day for 1 year.  Yearly screening should continue until it has been 15 years since you quit.  Yearly screening should stop if you develop a health problem that would prevent you from having lung cancer treatment.  Breast Cancer  Practice breast self-awareness. This means understanding how your breasts normally appear and feel.  It also means doing regular breast self-exams. Let your health care provider know about any changes, no matter how small.  If you are in your 20s or 30s, you should have a clinical breast exam (CBE) by a health care provider every 1-3 years as part of a regular health exam.  If you are 21 or older, have a  CBE every year. Also consider having a breast X-ray (mammogram) every year.  If you have a family history of breast cancer, talk to your health care provider about genetic screening.  If you are at high risk for breast cancer, talk to your health care provider about having an MRI and a mammogram every year.  Breast cancer gene (BRCA) assessment is recommended for women who have family members with BRCA-related cancers. BRCA-related cancers include:  Breast.  Ovarian.  Tubal.  Peritoneal cancers.  Results of the assessment will determine the need for genetic counseling and BRCA1  and BRCA2 testing. Cervical Cancer Your health care provider may recommend that you be screened regularly for cancer of the pelvic organs (ovaries, uterus, and vagina). This screening involves a pelvic examination, including checking for microscopic changes to the surface of your cervix (Pap test). You may be encouraged to have this screening done every 3 years, beginning at age 21.  For women ages 30-65, health care providers may recommend pelvic exams and Pap testing every 3 years, or they may recommend the Pap and pelvic exam, combined with testing for human papilloma virus (HPV), every 5 years. Some types of HPV increase your risk of cervical cancer. Testing for HPV may also be done on women of any age with unclear Pap test results.  Other health care providers may not recommend any screening for nonpregnant women who are considered low risk for pelvic cancer and who do not have symptoms. Ask your health care provider if a screening pelvic exam is right for you.  If you have had past treatment for cervical cancer or a condition that could lead to cancer, you need Pap tests and screening for cancer for at least 20 years after your treatment. If Pap tests have been discontinued, your risk factors (such as having a new sexual partner) need to be reassessed to determine if screening should resume. Some women have medical problems that increase the chance of getting cervical cancer. In these cases, your health care provider may recommend more frequent screening and Pap tests. Colorectal Cancer  This type of cancer can be detected and often prevented.  Routine colorectal cancer screening usually begins at 57 years of age and continues through 57 years of age.  Your health care provider may recommend screening at an earlier age if you have risk factors for colon cancer.  Your health care provider may also recommend using home test kits to check for hidden blood in the stool.  A small camera at the  end of a tube can be used to examine your colon directly (sigmoidoscopy or colonoscopy). This is done to check for the earliest forms of colorectal cancer.  Routine screening usually begins at age 50.  Direct examination of the colon should be repeated every 5-10 years through 57 years of age. However, you may need to be screened more often if early forms of precancerous polyps or small growths are found. Skin Cancer  Check your skin from head to toe regularly.  Tell your health care provider about any new moles or changes in moles, especially if there is a change in a mole's shape or color.  Also tell your health care provider if you have a mole that is larger than the size of a pencil eraser.  Always use sunscreen. Apply sunscreen liberally and repeatedly throughout the day.  Protect yourself by wearing long sleeves, pants, a wide-brimmed hat, and sunglasses whenever you are outside. HEART DISEASE, DIABETES, AND HIGH BLOOD   PRESSURE   High blood pressure causes heart disease and increases the risk of stroke. High blood pressure is more likely to develop in:  People who have blood pressure in the high end of the normal range (130-139/85-89 mm Hg).  People who are overweight or obese.  People who are African American.  If you are 18-39 years of age, have your blood pressure checked every 3-5 years. If you are 40 years of age or older, have your blood pressure checked every year. You should have your blood pressure measured twice--once when you are at a hospital or clinic, and once when you are not at a hospital or clinic. Record the average of the two measurements. To check your blood pressure when you are not at a hospital or clinic, you can use:  An automated blood pressure machine at a pharmacy.  A home blood pressure monitor.  If you are between 55 years and 79 years old, ask your health care provider if you should take aspirin to prevent strokes.  Have regular diabetes  screenings. This involves taking a blood sample to check your fasting blood sugar level.  If you are at a normal weight and have a low risk for diabetes, have this test once every three years after 57 years of age.  If you are overweight and have a high risk for diabetes, consider being tested at a younger age or more often. PREVENTING INFECTION  Hepatitis B  If you have a higher risk for hepatitis B, you should be screened for this virus. You are considered at high risk for hepatitis B if:  You were born in a country where hepatitis B is common. Ask your health care provider which countries are considered high risk.  Your parents were born in a high-risk country, and you have not been immunized against hepatitis B (hepatitis B vaccine).  You have HIV or AIDS.  You use needles to inject street drugs.  You live with someone who has hepatitis B.  You have had sex with someone who has hepatitis B.  You get hemodialysis treatment.  You take certain medicines for conditions, including cancer, organ transplantation, and autoimmune conditions. Hepatitis C  Blood testing is recommended for:  Everyone born from 1945 through 1965.  Anyone with known risk factors for hepatitis C. Sexually transmitted infections (STIs)  You should be screened for sexually transmitted infections (STIs) including gonorrhea and chlamydia if:  You are sexually active and are younger than 57 years of age.  You are older than 57 years of age and your health care provider tells you that you are at risk for this type of infection.  Your sexual activity has changed since you were last screened and you are at an increased risk for chlamydia or gonorrhea. Ask your health care provider if you are at risk.  If you do not have HIV, but are at risk, it may be recommended that you take a prescription medicine daily to prevent HIV infection. This is called pre-exposure prophylaxis (PrEP). You are considered at risk  if:  You are sexually active and do not regularly use condoms or know the HIV status of your partner(s).  You take drugs by injection.  You are sexually active with a partner who has HIV. Talk with your health care provider about whether you are at high risk of being infected with HIV. If you choose to begin PrEP, you should first be tested for HIV. You should then be tested every 3   months for as long as you are taking PrEP.  PREGNANCY   If you are premenopausal and you may become pregnant, ask your health care provider about preconception counseling.  If you may become pregnant, take 400 to 800 micrograms (mcg) of folic acid every day.  If you want to prevent pregnancy, talk to your health care provider about birth control (contraception). OSTEOPOROSIS AND MENOPAUSE   Osteoporosis is a disease in which the bones lose minerals and strength with aging. This can result in serious bone fractures. Your risk for osteoporosis can be identified using a bone density scan.  If you are 47 years of age or older, or if you are at risk for osteoporosis and fractures, ask your health care provider if you should be screened.  Ask your health care provider whether you should take a calcium or vitamin D supplement to lower your risk for osteoporosis.  Menopause may have certain physical symptoms and risks.  Hormone replacement therapy may reduce some of these symptoms and risks. Talk to your health care provider about whether hormone replacement therapy is right for you.  HOME CARE INSTRUCTIONS   Schedule regular health, dental, and eye exams.  Stay current with your immunizations.   Do not use any tobacco products including cigarettes, chewing tobacco, or electronic cigarettes.  If you are pregnant, do not drink alcohol.  If you are breastfeeding, limit how much and how often you drink alcohol.  Limit alcohol intake to no more than 1 drink per day for nonpregnant women. One drink equals 12  ounces of beer, 5 ounces of wine, or 1 ounces of hard liquor.  Do not use street drugs.  Do not share needles.  Ask your health care provider for help if you need support or information about quitting drugs.  Tell your health care provider if you often feel depressed.  Tell your health care provider if you have ever been abused or do not feel safe at home.   This information is not intended to replace advice given to you by your health care provider. Make sure you discuss any questions you have with your health care provider.   Document Released: 09/22/2010 Document Revised: 03/30/2014 Document Reviewed: 02/08/2013 Elsevier Interactive Patient Education [employer].

## 2016-01-17 NOTE — Assessment & Plan Note (Signed)
Checking TSH and adjust as needed. Taking synthroid 50 mcg daily.

## 2016-01-17 NOTE — Assessment & Plan Note (Signed)
Checking labs today and adjust as needed. Recent high TG on labs from psych so rechecking lipids. She is reminded about the need to make an appointment with her gyn for pap and mammogram. Checking hep c screening.

## 2016-01-18 LAB — HEPATITIS C ANTIBODY: HCV AB: NEGATIVE

## 2016-01-20 ENCOUNTER — Encounter: Payer: Self-pay | Admitting: Internal Medicine

## 2016-01-22 ENCOUNTER — Telehealth: Payer: Self-pay | Admitting: Internal Medicine

## 2016-01-22 NOTE — Telephone Encounter (Signed)
Patient called in to follow up on mychart emails. She states that she told dr Sharlet Salina at the visit about her energy levels being low, and that she doesn't feel like she was heard. She is asking that dr crawford call her directly

## 2016-01-22 NOTE — Telephone Encounter (Signed)
Spoke with patient. Per Dr. Sharlet Salina appt scheduled to address fatigue.

## 2016-01-24 ENCOUNTER — Encounter: Payer: Self-pay | Admitting: Internal Medicine

## 2016-01-24 ENCOUNTER — Other Ambulatory Visit (INDEPENDENT_AMBULATORY_CARE_PROVIDER_SITE_OTHER): Payer: BLUE CROSS/BLUE SHIELD

## 2016-01-24 ENCOUNTER — Ambulatory Visit (INDEPENDENT_AMBULATORY_CARE_PROVIDER_SITE_OTHER): Payer: BLUE CROSS/BLUE SHIELD | Admitting: Internal Medicine

## 2016-01-24 VITALS — BP 110/60 | HR 98 | Temp 98.1°F | Resp 12 | Ht 65.0 in | Wt 169.0 lb

## 2016-01-24 DIAGNOSIS — R5383 Other fatigue: Secondary | ICD-10-CM | POA: Diagnosis not present

## 2016-01-24 DIAGNOSIS — R531 Weakness: Secondary | ICD-10-CM | POA: Diagnosis not present

## 2016-01-24 LAB — COMPREHENSIVE METABOLIC PANEL
ALBUMIN: 3.6 g/dL (ref 3.5–5.2)
ALK PHOS: 61 U/L (ref 39–117)
ALT: 7 U/L (ref 0–35)
AST: 9 U/L (ref 0–37)
BUN: 32 mg/dL — AB (ref 6–23)
CALCIUM: 9.4 mg/dL (ref 8.4–10.5)
CHLORIDE: 107 meq/L (ref 96–112)
CO2: 26 mEq/L (ref 19–32)
CREATININE: 1.4 mg/dL — AB (ref 0.40–1.20)
GFR: 41.19 mL/min — ABNORMAL LOW (ref >60.00)
Glucose, Bld: 88 mg/dL (ref 70–99)
Potassium: 4 mEq/L (ref 3.5–5.1)
SODIUM: 142 meq/L (ref 135–145)
TOTAL PROTEIN: 6.4 g/dL (ref 6.0–8.3)
Total Bilirubin: 0.5 mg/dL (ref 0.2–1.2)

## 2016-01-24 LAB — CBC
HEMATOCRIT: 37 % (ref 36.0–46.0)
Hemoglobin: 12.7 g/dL (ref 12.0–15.0)
MCHC: 34.3 g/dL (ref 30.0–36.0)
MCV: 94.1 fl (ref 78.0–100.0)
Platelets: 188 10*3/uL (ref 150.0–400.0)
RBC: 3.93 Mil/uL (ref 3.87–5.11)
RDW: 17.6 % — AB (ref 11.5–15.5)
WBC: 5.7 10*3/uL (ref 4.0–10.5)

## 2016-01-24 LAB — TSH: TSH: 3.19 u[IU]/mL (ref 0.35–4.50)

## 2016-01-24 LAB — T4, FREE: FREE T4: 0.79 ng/dL (ref 0.60–1.60)

## 2016-01-24 LAB — VITAMIN D 25 HYDROXY (VIT D DEFICIENCY, FRACTURES): VITD: 22.5 ng/mL — ABNORMAL LOW (ref 30.00–100.00)

## 2016-01-24 LAB — VITAMIN B12: Vitamin B-12: 317 pg/mL (ref 211–911)

## 2016-01-24 NOTE — Progress Notes (Signed)
Pre visit review using our clinic review tool, if applicable. No additional management support is needed unless otherwise documented below in the visit note. 

## 2016-01-24 NOTE — Progress Notes (Signed)
   Subjective:    Patient ID: [name], female    DOB: 1960/05/11, 55 y.o.   MRN: [id number]  HPI The patient is a 55 YO female coming in for fatigue. She is having this problem for some time. She is still struggling with her left knee problems. She is also having problems with weakness. When she starts to do anything she fatigues very easily. This is severely limiting her life. She is not having weight change although she is down a few pounds in the last year without meaning to. Appetite is poor. Her psych meds have been changed in the last 6 months around the same time she started having symptoms. She has been on these meds before in different doses.   Review of Systems  Constitutional: Positive for activity change and appetite change. Negative for chills.  HENT: Negative.   Eyes: Negative.   Respiratory: Negative.   Cardiovascular: Negative.   Gastrointestinal: Negative.   Endocrine: Negative.   Musculoskeletal: Positive for arthralgias, joint swelling and myalgias. Negative for back pain and gait problem.  Skin: Negative.   Neurological: Positive for weakness. Negative for dizziness, seizures, light-headedness and numbness.  Psychiatric/Behavioral: Positive for decreased concentration. Negative for agitation, dysphoric mood, hallucinations, self-injury and suicidal ideas. The patient is nervous/anxious.       Objective:   Physical Exam  Constitutional: She is oriented to person, place, and time. She appears well-developed and well-nourished.  HENT:  Head: Normocephalic and atraumatic.  Eyes: EOM are normal.  Cardiovascular: Normal rate and regular rhythm.   Pulmonary/Chest: Effort normal and breath sounds normal. No respiratory distress. She has no wheezes.  Abdominal: Soft. She exhibits no distension. There is no tenderness. There is no rebound.  Neurological: She is alert and oriented to person, place, and time.  Skin: Skin is warm and dry.  Psychiatric:  Wandering  historian who is hard to redirect   Vitals:   01/24/16 1314  BP: 110/60  Pulse: 98  Resp: 12  Temp: 98.1 F (36.7 C)  TempSrc: Oral  SpO2: 98%  Weight: 169 lb (76.7 kg)  Height: 5\' 5"  (1.651 m)      Assessment & Plan:

## 2016-01-24 NOTE — Patient Instructions (Signed)
We are checking some more labs today to see if there is a cause for the weakness.   We will have you try zantac over the counter for the stomach symptoms to see if they get better.

## 2016-01-25 DIAGNOSIS — R5383 Other fatigue: Secondary | ICD-10-CM | POA: Insufficient documentation

## 2016-01-25 NOTE — Assessment & Plan Note (Addendum)
Needs check of thyroid, concern for myasthenia gravis with the easy fatigueability. Given her wandering history it is difficult to narrow with history.

## 2016-01-27 LAB — STRIATED MUSCLE ANTIBODY: Striated Muscle Ab: <1:40 {titer}

## 2016-01-28 ENCOUNTER — Other Ambulatory Visit: Payer: Self-pay | Admitting: Internal Medicine

## 2016-01-28 LAB — ACETYLCHOLINE RECEPTOR, BINDING

## 2016-01-28 MED ORDER — VITAMIN D (ERGOCALCIFEROL) 1.25 MG (50000 UNIT) PO CAPS: 50000.0000 [IU] | ORAL_CAPSULE | ORAL | 0 refills | Status: DC

## 2016-01-29 ENCOUNTER — Encounter: Payer: Self-pay | Admitting: Internal Medicine

## 2016-02-03 ENCOUNTER — Telehealth: Payer: Self-pay | Admitting: Internal Medicine

## 2016-02-03 NOTE — Telephone Encounter (Signed)
rec'd from [facility] forward 8 pages to [facility]

## 2016-03-19 ENCOUNTER — Other Ambulatory Visit: Payer: Self-pay | Admitting: Internal Medicine

## 2016-03-30 ENCOUNTER — Ambulatory Visit (INDEPENDENT_AMBULATORY_CARE_PROVIDER_SITE_OTHER): Payer: BLUE CROSS/BLUE SHIELD | Admitting: Internal Medicine

## 2016-03-30 ENCOUNTER — Encounter: Payer: Self-pay | Admitting: Internal Medicine

## 2016-03-30 DIAGNOSIS — R109 Unspecified abdominal pain: Secondary | ICD-10-CM

## 2016-03-30 DIAGNOSIS — K5900 Constipation, unspecified: Secondary | ICD-10-CM

## 2016-03-30 MED ORDER — PANTOPRAZOLE SODIUM 40 MG PO TBEC: 40.0000 mg | DELAYED_RELEASE_TABLET | Freq: Every day | ORAL | 3 refills | Status: DC

## 2016-03-30 NOTE — Progress Notes (Signed)
   Subjective:    Patient ID: [name], female    DOB: 1960-10-12, 56 y.o.   MRN: [id number]  HPI The patient is a 56 YO female coming in for stomach pain. Hurts when she has not had a BM in several days. Normal colonoscopy in 2010 and not due again until 2020. She is also having some pain with eating some of the time. All foods and not certain foods. Pain with eating is in the mid-epigastric region and lasts for 1-2 hours. She has not tried anything for it including antacids. She is having a lot of stress and worry in her life right now.   Review of Systems  Constitutional: Positive for fatigue. Negative for activity change, appetite change, fever and unexpected weight change.  HENT: Negative.   Eyes: Negative.   Respiratory: Negative.   Cardiovascular: Negative.   Gastrointestinal: Positive for abdominal pain and constipation. Negative for abdominal distention, diarrhea, nausea and vomiting.  Musculoskeletal: Positive for arthralgias, joint swelling and myalgias. Negative for gait problem.  Skin: Negative.       Objective:   Physical Exam  Constitutional: She appears well-developed and well-nourished.  HENT:  Head: Normocephalic and atraumatic.  Eyes: EOM are normal.  Neck: Normal range of motion.  Cardiovascular: Normal rate and regular rhythm.   Pulmonary/Chest: Effort normal and breath sounds normal.  Abdominal: Soft. Bowel sounds are normal. She exhibits no distension and no mass. There is tenderness. There is no rebound and no guarding.  Pain mild in the epigastric region, BS normal but mild distention versus obesity.    Vitals:   03/30/16 1616  BP: 130/70  Pulse: (!) 106  Resp: 12  Temp: 98.1 F (36.7 C)  TempSrc: Oral  SpO2: 99%  Weight: 158 lb 12 oz (72 kg)  Height: 5\' 5"  (1.651 m)      Assessment & Plan:

## 2016-03-30 NOTE — Patient Instructions (Addendum)
We have sent in the stomach medicine called protonix that you will take daily before breakfast.   Try taking senokot-d 1 pill twice a day to help with the constipation.    Constipation, Adult Constipation is when a person has fewer bowel movements in a week than normal, has difficulty having a bowel movement, or has stools that are dry, hard, or larger than normal. Constipation may be caused by an underlying condition. It may become worse with age if a person takes certain medicines and does not take in enough fluids. Follow these instructions at home: Eating and drinking  Eat foods that have a lot of fiber, such as fresh fruits and vegetables, whole grains, and beans.  Limit foods that are high in fat, low in fiber, or overly processed, such as french fries, hamburgers, cookies, candies, and soda.  Drink enough fluid to keep your urine clear or pale yellow. General instructions  Exercise regularly or as told by your health care provider.  Go to the restroom when you have the urge to go. Do not hold it in.  Take over-the-counter and prescription medicines only as told by your health care provider. These include any fiber supplements.  Practice pelvic floor retraining exercises, such as deep breathing while relaxing the lower abdomen and pelvic floor relaxation during bowel movements.  Watch your condition for any changes.  Keep all follow-up visits as told by your health care provider. This is important. Contact a health care provider if:  You have pain that gets worse.  You have a fever.  You do not have a bowel movement after 4 days.  You vomit.  You are not hungry.  You lose weight.  You are bleeding from the anus.  You have thin, pencil-like stools. Get help right away if:  You have a fever and your symptoms suddenly get worse.  You leak stool or have blood in your stool.  Your abdomen is bloated.  You have severe pain in your abdomen.  You feel dizzy or you  faint. This information is not intended to replace advice given to you by your health care provider. Make sure you discuss any questions you have with your health care provider. Document Released: 12/06/2003 Document Revised: 09/27/2015 Document Reviewed: 08/28/2015 Elsevier Interactive Patient Education  2017 [employer].

## 2016-03-30 NOTE — Progress Notes (Signed)
Pre visit review using our clinic review tool, if applicable. No additional management support is needed unless otherwise documented below in the visit note. 

## 2016-04-01 DIAGNOSIS — K5900 Constipation, unspecified: Secondary | ICD-10-CM | POA: Insufficient documentation

## 2016-04-01 DIAGNOSIS — R109 Unspecified abdominal pain: Secondary | ICD-10-CM | POA: Insufficient documentation

## 2016-04-01 NOTE — Assessment & Plan Note (Signed)
Will try PPI for the epigastric region pain as it is likely GERD. If no resolution can consider further evaluation.

## 2016-04-01 NOTE — Assessment & Plan Note (Signed)
She is advised to start taking something daily for constipation so that she is not constipated like senokot-d or miralax daily and she will think about that.

## 2016-04-15 ENCOUNTER — Telehealth: Payer: Self-pay | Admitting: Internal Medicine

## 2016-04-15 NOTE — Telephone Encounter (Signed)
Patient contacted and is feeling great and able to go every day.

## 2016-04-15 NOTE — Telephone Encounter (Signed)
If she is not feeling any better.

## 2016-04-15 NOTE — Telephone Encounter (Signed)
Patient would like to know if she needs to be seen again after starting pantoprazole?

## 2016-04-17 DIAGNOSIS — F410 Panic disorder [episodic paroxysmal anxiety] without agoraphobia: Secondary | ICD-10-CM | POA: Diagnosis not present

## 2016-04-17 DIAGNOSIS — F3111 Bipolar disorder, current episode manic without psychotic features, mild: Secondary | ICD-10-CM | POA: Diagnosis not present

## 2016-04-17 DIAGNOSIS — F3131 Bipolar disorder, current episode depressed, mild: Secondary | ICD-10-CM | POA: Diagnosis not present

## 2016-04-20 DIAGNOSIS — R3121 Asymptomatic microscopic hematuria: Secondary | ICD-10-CM | POA: Diagnosis not present

## 2016-04-20 DIAGNOSIS — R350 Frequency of micturition: Secondary | ICD-10-CM | POA: Diagnosis not present

## 2016-04-21 NOTE — Telephone Encounter (Signed)
Pt called again saying she thinks the pantoprazole is making her hair fall out, can you please call her when you can?

## 2016-04-21 NOTE — Telephone Encounter (Signed)
This is not a known side effect of protonix.

## 2016-04-22 NOTE — Telephone Encounter (Signed)
Patient contacted and stated awareness that hair loss is not a side effect of protonix

## 2016-05-06 DIAGNOSIS — R3121 Asymptomatic microscopic hematuria: Secondary | ICD-10-CM | POA: Diagnosis not present

## 2016-05-06 DIAGNOSIS — R3129 Other microscopic hematuria: Secondary | ICD-10-CM | POA: Diagnosis not present

## 2016-05-08 DIAGNOSIS — R3121 Asymptomatic microscopic hematuria: Secondary | ICD-10-CM | POA: Diagnosis not present

## 2016-05-08 DIAGNOSIS — R350 Frequency of micturition: Secondary | ICD-10-CM | POA: Diagnosis not present

## 2016-09-02 ENCOUNTER — Ambulatory Visit: Payer: BLUE CROSS/BLUE SHIELD | Admitting: Sports Medicine

## 2016-09-09 ENCOUNTER — Ambulatory Visit: Payer: BLUE CROSS/BLUE SHIELD | Admitting: Sports Medicine

## 2016-09-10 ENCOUNTER — Encounter: Payer: Self-pay | Admitting: Sports Medicine

## 2016-09-17 ENCOUNTER — Telehealth: Payer: Self-pay | Admitting: Internal Medicine

## 2016-09-17 MED ORDER — PANTOPRAZOLE SODIUM 40 MG PO TBEC: 40.0000 mg | DELAYED_RELEASE_TABLET | Freq: Every day | ORAL | 0 refills | Status: DC

## 2016-09-17 MED ORDER — LEVOTHYROXINE SODIUM 50 MCG PO TABS: 50.0000 ug | ORAL_TABLET | Freq: Every day | ORAL | 0 refills | Status: DC

## 2016-09-17 NOTE — Telephone Encounter (Signed)
Pt is needing a refill on Pantoprazole and Levothyroxine sent to [facility].

## 2016-09-17 NOTE — Telephone Encounter (Signed)
Pt due for yearly exam in Oct sent 90 day script on both med to [facility]...[name]

## 2016-09-18 ENCOUNTER — Ambulatory Visit: Payer: Self-pay

## 2016-09-18 ENCOUNTER — Encounter: Payer: Self-pay | Admitting: Sports Medicine

## 2016-09-18 ENCOUNTER — Ambulatory Visit (INDEPENDENT_AMBULATORY_CARE_PROVIDER_SITE_OTHER): Payer: BLUE CROSS/BLUE SHIELD | Admitting: Sports Medicine

## 2016-09-18 ENCOUNTER — Other Ambulatory Visit: Payer: Self-pay

## 2016-09-18 VITALS — BP 120/90 | HR 102 | Ht 65.0 in | Wt 171.0 lb

## 2016-09-18 DIAGNOSIS — M25562 Pain in left knee: Secondary | ICD-10-CM | POA: Diagnosis not present

## 2016-09-18 DIAGNOSIS — G8929 Other chronic pain: Secondary | ICD-10-CM | POA: Diagnosis not present

## 2016-09-18 MED ORDER — DICLOFENAC SODIUM 2 % TD SOLN: 1.0000 "application " | Freq: Two times a day (BID) | TRANSDERMAL | 2 refills | Status: DC

## 2016-09-18 MED ORDER — DICLOFENAC SODIUM 2 % TD SOLN: 1.0000 "application " | Freq: Two times a day (BID) | TRANSDERMAL | 0 refills | Status: AC

## 2016-09-18 NOTE — Progress Notes (Signed)
OFFICE VISIT NOTE [name]. [name], [place] at [facility] [phone]  [name] - 56 y.o. female MRN [id number]  Date of birth: Mar 15, 1961  Visit Date: 09/18/2016  PCP: Lori Koch, MD   Referred by: [name], *  [name], cma acting as scribe for Dr. Paulla Thomas.  SUBJECTIVE:   Chief Complaint  Patient presents with  . New Patient (Initial Visit)  . Left Knee Injury   HPI: As below and per problem based documentation when appropriate.   [name] presents with Chronic left knee pain. Pt injured her knee in 2015 where she tripped and fell over some boxes. Initially went to [facility]--did ray which she was told it was normal. Got a second opinion from Dr. [facility]. He told her that she did have some arthritis. On this past June 10 th, she got up from her couch with out wearing her knee brace, knee gave out on her when she feel on the coffee table and reinsured the area. Location of pain is the patella and move towards the lateral side of knee. Pt is taking Ibuprfen with minimal relief. MRI of left knee was 02/24/2014.    Review of Systems  Constitutional: Negative for chills, diaphoresis, fever, malaise/fatigue and weight loss.  HENT: Negative.   Eyes: Negative.   Respiratory: Negative.   Cardiovascular: Negative.   Gastrointestinal: Negative.   Genitourinary: Negative.   Musculoskeletal: Positive for falls, joint pain and myalgias. Negative for back pain and neck pain.  Skin: Negative.   Neurological: Negative.  Negative for weakness.  Endo/Heme/Allergies: Negative for environmental allergies and polydipsia. Does not bruise/bleed easily.  Psychiatric/Behavioral: Negative.     Otherwise per HPI.  HISTORY & PERTINENT PRIOR DATA:  No specialty comments available. She reports that she has been smoking.  She has a 16.50 pack-year smoking history. She has never used smokeless  tobacco.   Recent Labs  01/17/16 1136  HGBA1C 5.1   Medications & Allergies reviewed per EMR Patient Active Problem List   Diagnosis Date Noted  . Stomach pain 04/01/2016  . Constipation 04/01/2016  . Fatigue 01/25/2016  . Left knee pain 06/14/2014  . Routine general medical examination at a health care facility 01/09/2014  . Tobacco use disorder 01/09/2014  . Hypothyroidism 10/25/2013  . Anemia, unspecified 10/25/2013  . Genital herpes 06/29/2012   Past Medical History:  Diagnosis Date  . Arthritis   . Back pain   . Bipolar affective disorder ([place])   . Depression   . Genital herpes   . History of chicken pox   . Hypothyroidism    Family History  Problem Relation Age of Onset  . Arthritis Mother   . Cancer Neg Hx   . Diabetes Neg Hx   . Stroke Neg Hx   . Heart disease Neg Hx    Past Surgical History:  Procedure Laterality Date  . CESAREAN SECTION  1993  . COLONOSCOPY     neg  . [place]  2006  . LAPAROSCOPIC SALPINGO OOPHERECTOMY Left 12/19/2014   Procedure: LAPAROSCOPIC LEFT SALPINGO OOPHORECTOMY;  Surgeon: Lori Fowler, MD;  Location: [facility];  Service: Gynecology;  Laterality: Left;  . LAPAROSCOPY N/A 12/19/2014   Procedure: LAPAROSCOPY OPERATIVE;  Surgeon: Lori Fowler, MD;  Location: [facility];  Service: Gynecology;  Laterality: N/A;  . TONSILLECTOMY  1981  . TUBAL LIGATION  1994   Social History   Occupational History  . Retired  Social History Main Topics  . Smoking status: Current Every Day Smoker    Packs/day: 0.50    Years: 33.00  . Smokeless tobacco: Never Used     Comment: smoked 1983- present , up to 1/3 ppd  . Alcohol use Yes  . Drug use: No  . Sexual activity: Yes    OBJECTIVE:  VS:  HT:5\' 5"  (165.1 cm)   WT:171 lb (77.6 kg)  BMI:28.5    BP:120/90  HR:(!) 102bpm  TEMP: ( )  RESP:98 % EXAM: Findings:  WDWN, NAD, Non-toxic appearing Alert & appropriately interactive Not depressed or anxious appearing No increased  work of breathing. Pupils are equal. EOM intact without nystagmus No clubbing or cyanosis of the extremities appreciated No significant rashes/lesions/ulcerations overlying the examined area. DP & PT pulses 2+/4.  No significant pretibial edema.  No clubbing or cyanosis Sensation intact to light touch in lower extremities.  Left Knee: Overall joint is well aligned, no significant deformity.   No significant effusion.   ROM: 0 to 120.   Extensor mechanism intact No significant medial or lateral joint line tenderness.  Mild patellar facet pain on the medial aspect greater than lateral. Stable to varus/valgus strain & anterior/posterior drawer.  Normal Lachman's.   Negative McMurray's. pain with [place] but not with a twisting aspect only with eccentric phase.       No results found. ASSESSMENT & PLAN:     ICD-10-CM   1. 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 Strengthening of the VMO recommended and exercises provided.  Topical Pennsaid to be used as needed scheduled 2 weeks then as needed. Consider patellar stabilizing brace but she would like to defer this at this time.  Follow-up in 6 weeks to ensure clinical improvement.  Can consider corticosteroid injection with possibility of Visco supplementation if only moderate improvement. ================================================================= Patient Instructions  Do your exercises.  Use the pennsaid twice daily for the next 2 weeks.   ================================================================= Future Appointments Date Time Provider [place]  10/30/2016 2:00 PM Lori Diss, DO [facility] None   Follow-up: Return in about 6 weeks (around 10/30/2016).   CMA/ATC served as Education administrator during  this visit. History, Physical, and Plan performed by medical provider. Documentation and orders reviewed and attested to.      [name], [facility] Physician

## 2016-09-18 NOTE — Patient Instructions (Addendum)
Do your exercises.  Use the pennsaid twice daily for the next 2 weeks.

## 2016-09-21 ENCOUNTER — Telehealth: Payer: Self-pay | Admitting: Sports Medicine

## 2016-09-21 MED ORDER — DICLOFENAC SODIUM 2 % TD SOLN: 1.0000 "application " | Freq: Two times a day (BID) | TRANSDERMAL | 2 refills | Status: DC

## 2016-09-21 NOTE — Telephone Encounter (Signed)
I have resent medication to the [facility].

## 2016-09-21 NOTE — Telephone Encounter (Signed)
Patient would like a call back to see if there is any help to pay for the pennsaid. She said that she cannot afford it without some assistance. Please advise.

## 2016-09-21 NOTE — Telephone Encounter (Signed)
Waiting to hear back from drug rep. Pt assistance form printed.

## 2016-09-21 NOTE — Telephone Encounter (Signed)
Patient called in upset that pennsaid is not covered by her insurance. I emailed her a coupon card that will hopefully help. She will try to see if this card works.  Patient wanted to let you know.

## 2016-09-21 NOTE — Telephone Encounter (Signed)
Spoke with patient, she said that [facility] would not cover medication bc she has the '[employer] care' insurance through the government. They will not accept that type of BCBS. Pt is going to see if the coupon Lea emailed her will work.

## 2016-09-21 NOTE — Telephone Encounter (Signed)
I believe this happened at [facility].  This should have been sent one point.  It appears that it may have been sent into both but I cannot entirely tell.  Can we please follow-up on this and make sure that one point has this.  It should be either free or $10 for her.

## 2016-09-22 MED ORDER — DICLOFENAC SODIUM 1 % TD GEL: TRANSDERMAL | 3 refills | Status: DC

## 2016-09-22 NOTE — Telephone Encounter (Signed)
Generic Voltaren gel sent to patient's pharmacy.  This may require prior authorization and/or be prohibitively expensive as well but she can use this as a cheaper generic alternative.

## 2016-09-22 NOTE — Addendum Note (Signed)
Addended by: Teresa Coombs D on: 09/22/2016 05:16 PM   Modules accepted: Orders

## 2016-09-22 NOTE — Telephone Encounter (Signed)
Tried calling rep again, have not heard back. Will forward to Dr. Paulla Fore to see if there is an alternative that can be recommended.

## 2016-09-22 NOTE — Telephone Encounter (Signed)
Pt calling to check the status of the assistance with this med. Pt is asking if we could offer a cheaper alternate

## 2016-09-24 NOTE — Telephone Encounter (Signed)
Pt is aware that alternate prescription has been sent into the pharmacy.

## 2016-10-09 DIAGNOSIS — F6381 Intermittent explosive disorder: Secondary | ICD-10-CM | POA: Diagnosis not present

## 2016-10-16 NOTE — Assessment & Plan Note (Signed)
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 Strengthening of the VMO recommended and exercises provided.  Topical Pennsaid to be used as needed scheduled 2 weeks then as needed. Consider patellar stabilizing brace but she would like to defer this at this time.  Follow-up in 6 weeks to ensure clinical improvement.  Can consider corticosteroid injection with possibility of Visco supplementation if only moderate improvement.

## 2016-10-16 NOTE — Procedures (Signed)
LIMITED MSK ULTRASOUND OF Left Knee Images were obtained and interpreted by myself, Teresa Coombs, DO  Images have been saved and stored to PACS system. Images obtained on: GE S7 Ultrasound machine  FINDINGS:   Patella & Patellar Tendon: Normal  Quad & Quad Tendon: Normal  Suprapatellar Pouch: Normal, no effusion  Medial Joint Line: Slight degenerative bulging of medial meniscus but mild.  No tear  Lateral Joint Line: Slight degenerative bulging of the lateral meniscus but mild, no tear  Trochlea: There is cortical irregularities within the medial aspect of the left trochlea as well as peaking of the medial and lateral joint lines.  Posterior knee: n/a  IMPRESSION:  1. Patellofemoral osteoarthritis with minimal degenerative change of the medial and lateral meniscus.

## 2016-10-30 ENCOUNTER — Ambulatory Visit (INDEPENDENT_AMBULATORY_CARE_PROVIDER_SITE_OTHER): Payer: BLUE CROSS/BLUE SHIELD | Admitting: Sports Medicine

## 2016-10-30 ENCOUNTER — Ambulatory Visit: Payer: Self-pay

## 2016-10-30 VITALS — BP 100/70 | HR 67 | Ht 65.0 in | Wt 169.4 lb

## 2016-10-30 DIAGNOSIS — M25562 Pain in left knee: Secondary | ICD-10-CM

## 2016-10-30 DIAGNOSIS — G8929 Other chronic pain: Secondary | ICD-10-CM | POA: Diagnosis not present

## 2016-10-30 NOTE — Procedures (Signed)
PROCEDURE NOTE -  ULTRASOUND GUIDEDINJECTION: Left Knee Injection Images were obtained and interpreted by myself, Teresa Coombs, DO  Images have been saved and stored to PACS system. Images obtained on: GE S7 Ultrasound machine  ULTRASOUND FINDINGS:  Generalized synovitis, no significant effusion  DESCRIPTION OF PROCEDURE:  The patient's clinical condition is marked by substantial pain and/or significant functional disability. Other conservative therapy has not provided relief, is contraindicated, or not appropriate. There is a reasonable likelihood that injection will significantly improve the patient's pain and/or functional impairment. After discussing the risks, benefits and expected outcomes of the injection and all questions were reviewed and answered, the patient wished to undergo the above named procedure. Verbal consent was obtained. The ultrasound was used to identify the target structure and adjacent neurovascular structures. The skin was then prepped in sterile fashion and the target structure was injected under direct visualization using sterile technique as below: PREP: Alcohol, Ethel Chloride APPROACH: direct inplane, single injection, 25g 1.5" needle INJECTATE: 2cc 1% lidocaine, 2cc 40mg  DepoMedrol ASPIRATE: N/A DRESSING: Band-Aid  Post procedural instructions including recommending icing and warning signs for infection were reviewed. This procedure was well tolerated and there were no complications.   IMPRESSION: Succesful US Guided Injection

## 2016-10-30 NOTE — Progress Notes (Signed)
OFFICE VISIT NOTE [name]. [name], [place] at [facility] [phone]  [name] - 56 y.o. female MRN [id number]  Date of birth: 28-Jul-1960  Visit Date: 10/30/2016  PCP: Lori Koch, MD   Referred by: [name], *  [name], cma acting as scribe for Dr. Paulla Fore.  SUBJECTIVE:   Chief Complaint  Patient presents with  . Follow-up  . Chronic Left Knee Pain/Patella Femoral OA   HPI: As below and per problem based documentation when appropriate.   [name] presents as an established patient following up for her Chronic Left Knee Pain. Minimal improvement in pain symptoms since last visit. Increased activity trigger the pain, will get relief with rest. Wearing knee brace for support. Doing 30 leg lifts per day and using pennsaid every 4 hours which has helped.     Review of Systems  Constitutional: Negative for chills, diaphoresis, fever, malaise/fatigue and weight loss.  HENT: Negative.   Eyes: Negative.   Respiratory: Negative.   Cardiovascular: Negative.   Gastrointestinal: Negative.   Genitourinary: Negative.   Musculoskeletal: Positive for joint pain and myalgias. Negative for back pain, falls and neck pain.  Skin: Negative for itching and rash.  Neurological: Negative.  Negative for weakness.  Endo/Heme/Allergies: Negative for environmental allergies and polydipsia. Does not bruise/bleed easily.  Psychiatric/Behavioral: Negative.     Otherwise per HPI.  HISTORY & PERTINENT PRIOR DATA:  No specialty comments available. She reports that she has been smoking.  She has a 16.50 pack-year smoking history. She has never used smokeless tobacco.   Recent Labs  01/17/16 1136  HGBA1C 5.1   Medications & Allergies reviewed per EMR Patient Active Problem List   Diagnosis Date Noted  . Stomach pain 04/01/2016  . Constipation 04/01/2016  . Fatigue 01/25/2016  . Left knee pain 06/14/2014  . Routine  general medical examination at a health care facility 01/09/2014  . Tobacco use disorder 01/09/2014  . Hypothyroidism 10/25/2013  . Anemia, unspecified 10/25/2013  . Genital herpes 06/29/2012   Past Medical History:  Diagnosis Date  . Arthritis   . Back pain   . Bipolar affective disorder ([place])   . Depression   . Genital herpes   . History of chicken pox   . Hypothyroidism    Family History  Problem Relation Age of Onset  . Arthritis Mother   . Cancer Neg Hx   . Diabetes Neg Hx   . Stroke Neg Hx   . Heart disease Neg Hx    Past Surgical History:  Procedure Laterality Date  . CESAREAN SECTION  1993  . COLONOSCOPY     neg  . [place]  2006  . LAPAROSCOPIC SALPINGO OOPHERECTOMY Left 12/19/2014   Procedure: LAPAROSCOPIC LEFT SALPINGO OOPHORECTOMY;  Surgeon: Cheri Fowler, MD;  Location: [facility];  Service: Gynecology;  Laterality: Left;  . LAPAROSCOPY N/A 12/19/2014   Procedure: LAPAROSCOPY OPERATIVE;  Surgeon: Cheri Fowler, MD;  Location: [facility];  Service: Gynecology;  Laterality: N/A;  . TONSILLECTOMY  1981  . TUBAL LIGATION  1994   Social History   Occupational History  . Retired    Social History Main Topics  . Smoking status: Current Every Day Smoker    Packs/day: 0.50    Years: 33.00  . Smokeless tobacco: Never Used     Comment: smoked 1983- present , up to 1/3 ppd  . Alcohol use Yes  . Drug use: No  . Sexual  activity: Yes    OBJECTIVE:  VS:  HT:5\' 5"  (165.1 cm)   WT:169 lb 6.4 oz (76.8 kg)  BMI:28.2    BP:100/70  HR:67bpm  TEMP: ( )  RESP:98 % EXAM: Findings:  WDWN, NAD, Non-toxic appearing Alert & appropriately interactive Not depressed or anxious appearing No increased work of breathing. Pupils are equal. EOM intact without nystagmus No clubbing or cyanosis of the extremities appreciated No significant rashes/lesions/ulcerations overlying the examined area. DP & PT pulses 2+/4.  No significant pretibial edema.  No clubbing or  cyanosis Sensation intact to light touch in lower extremities.  Left Knee: Overall joint is well aligned, no significant deformity.   Generalized synovitis without significant effusion.   ROM: 0 to 120.   Extensor mechanism intact Generalized TTP over the medial and lateral joint lines. Stable to varus/valgus strain & anterior/posterior drawer.  Normal Lachman's.   Pain with McMurray's but no mechanical clicking.     [place] Limited Joint Space Structures Low Left(no Linked Charges)  Result Date: 10/30/2016 [name], DO     10/30/2016  2:44 PM PROCEDURE NOTE -  ULTRASOUND GUIDEDINJECTION: Left Knee Injection Images were obtained and interpreted by myself, Teresa Coombs, DO Images have been saved and stored to PACS system. Images obtained on: GE S7 Ultrasound machine  ULTRASOUND FINDINGS: Generalized synovitis, no significant effusion DESCRIPTION OF PROCEDURE: The patient's clinical condition is marked by substantial pain and/or significant functional disability. Other conservative therapy has not provided relief, is contraindicated, or not appropriate. There is a reasonable likelihood that injection will significantly improve the patient's pain and/or functional impairment. After discussing the risks, benefits and expected outcomes of the injection and all questions were reviewed and answered, the patient wished to undergo the above named procedure. Verbal consent was obtained. The ultrasound was used to identify the target structure and adjacent neurovascular structures. The skin was then prepped in sterile fashion and the target structure was injected under direct visualization using sterile technique as below: PREP: Alcohol, Ethel Chloride APPROACH: direct inplane, single injection, 25g 1.5" needle INJECTATE: 2cc 1% lidocaine, 2cc 40mg  DepoMedrol ASPIRATE: N/A DRESSING: Band-Aid Post procedural instructions including recommending icing and warning signs for infection were reviewed. This  procedure was well tolerated and there were no complications.  IMPRESSION: Succesful US Guided Injection    ASSESSMENT & PLAN:     ICD-10-CM   1. Chronic pain of left knee M25.562 [place] LIMITED JOINT SPACE STRUCTURES LOW LEFT(NO LINKED CHARGES)   G89.29   ================================================================= Left knee pain Ultrasound-guided injection performed today. Body Healix compression sleeve. Therapeutic exercises working on quad strengthening and hip strengthening emphasized with the patient today.  Avoidance of exacerbating activities reviewed.  Follow-up with any lack of improvement. =================================================================  Follow-up: Return in about 6 weeks (around 12/11/2016).   CMA/ATC served as Education administrator during this visit. History, Physical, and Plan performed by medical provider. Documentation and orders reviewed and attested to.      Teresa Coombs, [facility] Physician

## 2016-10-30 NOTE — Patient Instructions (Signed)

## 2016-11-16 NOTE — Assessment & Plan Note (Signed)
Ultrasound-guided injection performed today. Body Healix compression sleeve. Therapeutic exercises working on quad strengthening and hip strengthening emphasized with the patient today.  Avoidance of exacerbating activities reviewed.  Follow-up with any lack of improvement.

## 2016-12-11 ENCOUNTER — Ambulatory Visit: Payer: BLUE CROSS/BLUE SHIELD | Admitting: Sports Medicine

## 2016-12-15 ENCOUNTER — Ambulatory Visit (INDEPENDENT_AMBULATORY_CARE_PROVIDER_SITE_OTHER): Payer: BLUE CROSS/BLUE SHIELD | Admitting: Sports Medicine

## 2016-12-15 ENCOUNTER — Encounter: Payer: Self-pay | Admitting: Sports Medicine

## 2016-12-15 VITALS — BP 120/82 | HR 98 | Ht 65.0 in | Wt 161.8 lb

## 2016-12-15 DIAGNOSIS — M2242 Chondromalacia patellae, left knee: Secondary | ICD-10-CM

## 2016-12-15 DIAGNOSIS — M25562 Pain in left knee: Secondary | ICD-10-CM | POA: Diagnosis not present

## 2016-12-15 DIAGNOSIS — G8929 Other chronic pain: Secondary | ICD-10-CM | POA: Diagnosis not present

## 2016-12-15 NOTE — Assessment & Plan Note (Signed)
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  She is responded to corticosteroid treatment with moderate improvement.  She has had worsening symptoms over the past several years with worsening weakness and intermittent instability symptoms but no true locking or giving way.  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  We will have her try a DonJoy Tru pull brace with lateral stabilizing buttress.  Encouraged continued strengthening and avoidance of exacerbating activities including deep knee flexion.  We could consider MRI again at this time and see if she has had any progression of the chondromalacia for consideration of partial knee arthroplasty but this is likely not indicated at this time.  Continue with topical treatments icing and strengthening.

## 2016-12-15 NOTE — Progress Notes (Signed)
OFFICE VISIT NOTE [name], [place] at [facility] [phone]  [name] - 56 y.o. female MRN [id number]  Date of birth: [birth year]  Visit Date: 12/15/2016  PCP: Hoyt Koch, MD   Referred by: Gayla Doss, CMA acting as scribe for Dr. Paulla Fore.  SUBJECTIVE:   Chief Complaint  Patient presents with  . Follow-up    LT knee pain   HPI: As below and per problem based documentation when appropriate.  [name] is an established patient presenting today in follow-up of LT knee pain. She was last seen 10/30/2016 and given u/s guided steroid injection. She was advised to continuing using Voltaren gel and Body Helix compression sleeve.  She also uses a hinged knee brace was previously provided.  Overall she does report improvement in her symptoms since initially being treated and is happy with how she is progressing.  Pt reports that she only got about 2-3 days of relief from her steroid injection. She says that when she gets up from the couch she feels like something is disconnecting and stabbing her in the knee. She has been wearing her knee brace at night. She says that she was told to stop wearing the compression sleeve at her last visit. She has a sharp pain when her foot is planted and she tried to turn her body. She does have some swelling on the lower lateral aspect of the knee. Swelling doing seem to get worse when she is being very active. She hasn't been able to do her exercises recently because her stomach has been bothering her, "throwing up stomach acid". She had a similar problem when she had a cyst on her ovary, she plans to follow-up with her PCP for this.     ROS  Otherwise per HPI.  HISTORY & PERTINENT PRIOR DATA:  No specialty comments available. She reports that she has been smoking.  She has a 16.50 pack-year smoking history. She has never used smokeless tobacco.   Recent  Labs  01/17/16 1136  HGBA1C 5.1   Allergies reviewed per EMR Prior to Admission medications   Medication Sig Start Date End Date Taking? Authorizing Provider  alprazolam Duanne Moron) 2 MG tablet Take 1 tablet (2 mg total) by mouth 3 (three) times daily as needed for anxiety. 01/11/15  Yes Hoyt Koch, MD  diazepam (VALIUM) 10 MG tablet Take 1 tablet by mouth 4 (four) times daily as needed. anxiety 08/24/15  Yes [provider]  diclofenac sodium (VOLTAREN) 1 % GEL Apply topically to affected area qid 09/22/16  Yes [name], DO  EPINEPHrine 0.3 mg/0.3 mL IJ SOAJ injection Inject into the muscle once.   Yes [provider]  GLUCOSAMINE HCL PO Take 1 tablet by mouth 2 (two) times daily.   Yes [provider]  levothyroxine (SYNTHROID, LEVOTHROID) 50 MCG tablet Take 1 tablet (50 mcg total) by mouth daily. Annual appt w/labs due in Oct must see MD for refills 09/17/16  Yes Hoyt Koch, MD  pantoprazole (PROTONIX) 40 MG tablet Take 1 tablet (40 mg total) by mouth daily. Annual appt w/labs due in Oct must see MD for refills 09/17/16  Yes Hoyt Koch, MD  phentermine 37.5 MG capsule Take 37.5 mg by mouth. Take two by mouth in the morning.   Yes [provider]  VITAMIN D, CHOLECALCIFEROL, PO Take 1,000 Units by mouth daily.    Yes  [provider]  zolpidem (AMBIEN) 5 MG tablet Take 5 mg by mouth at bedtime as needed for sleep.   Yes [provider]   Patient Active Problem List   Diagnosis Date Noted  . Stomach pain 04/01/2016  . Constipation 04/01/2016  . Fatigue 01/25/2016  . 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 Routine general medical examination at a health care facility 01/09/2014  . Tobacco use disorder 01/09/2014  . Hypothyroidism 10/25/2013  . Anemia, unspecified 10/25/2013  . Genital herpes 06/29/2012   Past Medical History:  Diagnosis Date  . Arthritis   . Back pain   . Bipolar affective  disorder ([place])   . Depression   . Genital herpes   . History of chicken pox   . Hypothyroidism    Family History  Problem Relation Age of Onset  . Arthritis Mother   . Cancer Neg Hx   . Diabetes Neg Hx   . Stroke Neg Hx   . Heart disease Neg Hx    Past Surgical History:  Procedure Laterality Date  . CESAREAN SECTION  1993  . COLONOSCOPY     neg  . [place]  2006  . LAPAROSCOPIC SALPINGO OOPHERECTOMY Left 12/19/2014   Procedure: LAPAROSCOPIC LEFT SALPINGO OOPHORECTOMY;  Surgeon: Cheri Fowler, MD;  Location: [facility];  Service: Gynecology;  Laterality: Left;  . LAPAROSCOPY N/A 12/19/2014   Procedure: LAPAROSCOPY OPERATIVE;  Surgeon: Cheri Fowler, MD;  Location: [facility];  Service: Gynecology;  Laterality: N/A;  . TONSILLECTOMY  1981  . TUBAL LIGATION  1994   Social History   Occupational History  . Retired    Social History Main Topics  . Smoking status: Current Every Day Smoker    Packs/day: 0.50    Years: 33.00  . Smokeless tobacco: Never Used     Comment: smoked 1983- present , up to 1/3 ppd  . Alcohol use Yes  . Drug use: No  . Sexual activity: Yes    OBJECTIVE:  VS:  HT:5\' 5"  (165.1 cm)   WT:161 lb 12.8 oz (73.4 kg)  BMI:26.92    BP:120/82  HR:98bpm  TEMP: ( )  RESP:98 % EXAM: Findings:  Adult female.  No acute distress.  Tangential thought process with circumferential storytelling.  Her left knee is overall well aligned although she does have a slight lateral tilt to the patella.  She has full flexion and extension.  Pain is worse with patellar grind and terminal flexion.  She has 2-3 mm opening with valgus stressing as well as with varus stressing but solid endpoints.  Anterior posterior drawer testing is normal.  Her McMurray's is normal without significant clicking or popping.    RADIOLOGY: [place] LIMITED JOINT SPACE STRUCTURES LOW LEFT(NO LINKED CHARGES) [name], DO     10/30/2016  2:44 PM PROCEDURE NOTE -  ULTRASOUND GUIDEDINJECTION:  Left Knee  Injection Images were obtained and interpreted by myself, Teresa Coombs, DO   Images have been saved and stored to PACS system. Images obtained on: GE S7 Ultrasound machine  ULTRASOUND FINDINGS:  Generalized synovitis, no significant effusion  DESCRIPTION OF PROCEDURE:  The patient's clinical condition is marked by substantial pain  and/or significant functional disability. Other conservative  therapy has not provided relief, is contraindicated, or not  appropriate. There is a reasonable likelihood that injection will  significantly improve the patient's pain and/or functional  impairment. After discussing the risks, benefits and expected  outcomes of the injection and all questions were reviewed and  answered, the patient wished to undergo the above named  procedure. Verbal consent was obtained. The ultrasound was used  to identify the target structure and adjacent neurovascular  structures. The skin was then prepped in sterile fashion and the  target structure was injected under direct visualization using  sterile technique as below: PREP: Alcohol, Ethel Chloride APPROACH: direct inplane, single injection, 25g 1.5" needle INJECTATE: 2cc 1% lidocaine, 2cc 40mg  DepoMedrol ASPIRATE: N/A DRESSING: Band-Aid  Post procedural instructions including recommending icing and  warning signs for infection were reviewed. This procedure was  well tolerated and there were no complications.   IMPRESSION: Succesful US Guided Injection    ASSESSMENT & PLAN:     ICD-10-CM   1. Chondromalacia, patella, left M22.42   2. Chronic pain of left knee M25.562    G89.29    ================================================================= Chondromalacia, patella, left Symptoms are consistent with chondral malacia patella.  She is responded to corticosteroid treatment with moderate improvement.  She has had worsening symptoms over the past several years with worsening weakness and  intermittent instability symptoms but no true locking or giving way.  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  We will have her try a DonJoy Tru pull brace with lateral stabilizing buttress.  Encouraged continued strengthening and avoidance of exacerbating activities including deep knee flexion.  We could consider MRI again at this time and see if she has had any progression of the chondromalacia for consideration of partial knee arthroplasty but this is likely not indicated at this time.  Continue with topical treatments icing and strengthening.  Follow-up: Return in about 3 months (around 03/16/2017).   CMA/ATC served as Education administrator during this visit. History, Physical, and Plan performed by medical provider. Documentation and orders reviewed and attested to.      Teresa Coombs, [facility] Physician

## 2016-12-20 ENCOUNTER — Other Ambulatory Visit: Payer: Self-pay | Admitting: Internal Medicine

## 2016-12-31 ENCOUNTER — Ambulatory Visit: Payer: Self-pay | Admitting: Internal Medicine

## 2017-01-18 ENCOUNTER — Other Ambulatory Visit (INDEPENDENT_AMBULATORY_CARE_PROVIDER_SITE_OTHER): Payer: BLUE CROSS/BLUE SHIELD

## 2017-01-18 ENCOUNTER — Encounter: Payer: Self-pay | Admitting: Internal Medicine

## 2017-01-18 ENCOUNTER — Ambulatory Visit (INDEPENDENT_AMBULATORY_CARE_PROVIDER_SITE_OTHER): Payer: BLUE CROSS/BLUE SHIELD | Admitting: Internal Medicine

## 2017-01-18 VITALS — BP 130/88 | HR 89 | Temp 97.9°F | Ht 65.0 in | Wt 162.0 lb

## 2017-01-18 DIAGNOSIS — Z0000 Encounter for general adult medical examination without abnormal findings: Secondary | ICD-10-CM

## 2017-01-18 LAB — COMPREHENSIVE METABOLIC PANEL
ALT: 9 U/L (ref 0–35)
AST: 13 U/L (ref 0–37)
Albumin: 4.1 g/dL (ref 3.5–5.2)
Alkaline Phosphatase: 84 U/L (ref 39–117)
BUN: 12 mg/dL (ref 6–23)
CALCIUM: 9.9 mg/dL (ref 8.4–10.5)
CHLORIDE: 104 meq/L (ref 96–112)
CO2: 27 meq/L (ref 19–32)
CREATININE: 0.94 mg/dL (ref 0.40–1.20)
GFR: 65 mL/min (ref >60.00)
Glucose, Bld: 124 mg/dL — ABNORMAL HIGH (ref 70–99)
POTASSIUM: 3.3 meq/L — AB (ref 3.5–5.1)
Sodium: 140 mEq/L (ref 135–145)
Total Bilirubin: 0.5 mg/dL (ref 0.2–1.2)
Total Protein: 6.9 g/dL (ref 6.0–8.3)

## 2017-01-18 LAB — CBC
HEMATOCRIT: 39 % (ref 36.0–46.0)
HEMOGLOBIN: 12.7 g/dL (ref 12.0–15.0)
MCHC: 32.6 g/dL (ref 30.0–36.0)
MCV: 92.5 fl (ref 78.0–100.0)
Platelets: 321 10*3/uL (ref 150.0–400.0)
RBC: 4.22 Mil/uL (ref 3.87–5.11)
RDW: 13.9 % (ref 11.5–15.5)
WBC: 7.3 10*3/uL (ref 4.0–10.5)

## 2017-01-18 LAB — HEMOGLOBIN A1C: Hgb A1c MFr Bld: 5.2 % (ref 4.6–6.5)

## 2017-01-18 LAB — LIPID PANEL
CHOL/HDL RATIO: 5
CHOLESTEROL: 258 mg/dL — AB (ref 0–200)
HDL: 47.5 mg/dL (ref >39.00)
LDL Cholesterol: 182 mg/dL — ABNORMAL HIGH (ref 0–99)
NonHDL: 210.71
TRIGLYCERIDES: 144 mg/dL (ref 0.0–149.0)
VLDL: 28.8 mg/dL (ref 0.0–40.0)

## 2017-01-18 LAB — TSH: TSH: 2.13 u[IU]/mL (ref 0.35–4.50)

## 2017-01-18 LAB — VITAMIN B12: Vitamin B-12: 182 pg/mL — ABNORMAL LOW (ref 211–911)

## 2017-01-18 LAB — T4, FREE: Free T4: 0.89 ng/dL (ref 0.60–1.60)

## 2017-01-18 MED ORDER — EPINEPHRINE 0.15 MG/0.15ML IJ SOAJ
0.1500 mg | INTRAMUSCULAR | 1 refills | Status: DC | PRN
Start: 2017-01-18 — End: 2017-09-21

## 2017-01-18 NOTE — Patient Instructions (Signed)
We will check the labs today.  Health Maintenance, Female Adopting a healthy lifestyle and getting preventive care can go a long way to promote health and wellness. Talk with your health care provider about what schedule of regular examinations is right for you. This is a good chance for you to check in with your provider about disease prevention and staying healthy. In between checkups, there are plenty of things you can do on your own. Experts have done a lot of research about which lifestyle changes and preventive measures are most likely to keep you healthy. Ask your health care provider for more information. Weight and diet Eat a healthy diet  Be sure to include plenty of vegetables, fruits, low-fat dairy products, and lean protein.  Do not eat a lot of foods high in solid fats, added sugars, or salt.  Get regular exercise. This is one of the most important things you can do for your health. ? Most adults should exercise for at least 150 minutes each week. The exercise should increase your heart rate and make you sweat (moderate-intensity exercise). ? Most adults should also do strengthening exercises at least twice a week. This is in addition to the moderate-intensity exercise.  Maintain a healthy weight  Body mass index (BMI) is a measurement that can be used to identify possible weight problems. It estimates body fat based on height and weight. Your health care provider can help determine your BMI and help you achieve or maintain a healthy weight.  For females 58 years of age and older: ? A BMI below 18.5 is considered underweight. ? A BMI of 18.5 to 24.9 is normal. ? A BMI of 25 to 29.9 is considered overweight. ? A BMI of 30 and above is considered obese.  Watch levels of cholesterol and blood lipids  You should start having your blood tested for lipids and cholesterol at 58 years of age, then have this test every 5 years.  You may need to have your cholesterol levels checked  more often if: ? Your lipid or cholesterol levels are high. ? You are older than 58 years of age. ? You are at high risk for heart disease.  Cancer screening Lung Cancer  Lung cancer screening is recommended for adults 50-85 years old who are at high risk for lung cancer because of a history of smoking.  A yearly low-dose CT scan of the lungs is recommended for people who: ? Currently smoke. ? Have quit within the past 15 years. ? Have at least a 30-pack-year history of smoking. A pack year is smoking an average of one pack of cigarettes a day for 1 year.  Yearly screening should continue until it has been 15 years since you quit.  Yearly screening should stop if you develop a health problem that would prevent you from having lung cancer treatment.  Breast Cancer  Practice breast self-awareness. This means understanding how your breasts normally appear and feel.  It also means doing regular breast self-exams. Let your health care provider know about any changes, no matter how small.  If you are in your 20s or 30s, you should have a clinical breast exam (CBE) by a health care provider every 1-3 years as part of a regular health exam.  If you are 47 or older, have a CBE every year. Also consider having a breast X-ray (mammogram) every year.  If you have a family history of breast cancer, talk to your health care provider about genetic screening.  If you are at high risk for breast cancer, talk to your health care provider about having an MRI and a mammogram every year.  Breast cancer gene (BRCA) assessment is recommended for women who have family members with BRCA-related cancers. BRCA-related cancers include: ? Breast. ? Ovarian. ? Tubal. ? Peritoneal cancers.  Results of the assessment will determine the need for genetic counseling and BRCA1 and BRCA2 testing.  Cervical Cancer Your health care provider may recommend that you be screened regularly for cancer of the pelvic  organs (ovaries, uterus, and vagina). This screening involves a pelvic examination, including checking for microscopic changes to the surface of your cervix (Pap test). You may be encouraged to have this screening done every 3 years, beginning at age 17.  For women ages 64-65, health care providers may recommend pelvic exams and Pap testing every 3 years, or they may recommend the Pap and pelvic exam, combined with testing for human papilloma virus (HPV), every 5 years. Some types of HPV increase your risk of cervical cancer. Testing for HPV may also be done on women of any age with unclear Pap test results.  Other health care providers may not recommend any screening for nonpregnant women who are considered low risk for pelvic cancer and who do not have symptoms. Ask your health care provider if a screening pelvic exam is right for you.  If you have had past treatment for cervical cancer or a condition that could lead to cancer, you need Pap tests and screening for cancer for at least 20 years after your treatment. If Pap tests have been discontinued, your risk factors (such as having a new sexual partner) need to be reassessed to determine if screening should resume. Some women have medical problems that increase the chance of getting cervical cancer. In these cases, your health care provider may recommend more frequent screening and Pap tests.  Colorectal Cancer  This type of cancer can be detected and often prevented.  Routine colorectal cancer screening usually begins at 58 years of age and continues through 58 years of age.  Your health care provider may recommend screening at an earlier age if you have risk factors for colon cancer.  Your health care provider may also recommend using home test kits to check for hidden blood in the stool.  A small camera at the end of a tube can be used to examine your colon directly (sigmoidoscopy or colonoscopy). This is done to check for the earliest forms  of colorectal cancer.  Routine screening usually begins at age 78.  Direct examination of the colon should be repeated every 5-10 years through 58 years of age. However, you may need to be screened more often if early forms of precancerous polyps or small growths are found.  Skin Cancer  Check your skin from head to toe regularly.  Tell your health care provider about any new moles or changes in moles, especially if there is a change in a mole's shape or color.  Also tell your health care provider if you have a mole that is larger than the size of a pencil eraser.  Always use sunscreen. Apply sunscreen liberally and repeatedly throughout the day.  Protect yourself by wearing long sleeves, pants, a wide-brimmed hat, and sunglasses whenever you are outside.  Heart disease, diabetes, and high blood pressure  High blood pressure causes heart disease and increases the risk of stroke. High blood pressure is more likely to develop in: ? People who have blood  pressure in the high end of the normal range (130-139/85-89 mm Hg). ? People who are overweight or obese. ? People who are African American.  If you are 65-57 years of age, have your blood pressure checked every 3-5 years. If you are 45 years of age or older, have your blood pressure checked every year. You should have your blood pressure measured twice-once when you are at a hospital or clinic, and once when you are not at a hospital or clinic. Record the average of the two measurements. To check your blood pressure when you are not at a hospital or clinic, you can use: ? An automated blood pressure machine at a pharmacy. ? A home blood pressure monitor.  If you are between 10 years and 29 years old, ask your health care provider if you should take aspirin to prevent strokes.  Have regular diabetes screenings. This involves taking a blood sample to check your fasting blood sugar level. ? If you are at a normal weight and have a low risk  for diabetes, have this test once every three years after 58 years of age. ? If you are overweight and have a high risk for diabetes, consider being tested at a younger age or more often. Preventing infection Hepatitis B  If you have a higher risk for hepatitis B, you should be screened for this virus. You are considered at high risk for hepatitis B if: ? You were born in a country where hepatitis B is common. Ask your health care provider which countries are considered high risk. ? Your parents were born in a high-risk country, and you have not been immunized against hepatitis B (hepatitis B vaccine). ? You have HIV or AIDS. ? You use needles to inject street drugs. ? You live with someone who has hepatitis B. ? You have had sex with someone who has hepatitis B. ? You get hemodialysis treatment. ? You take certain medicines for conditions, including cancer, organ transplantation, and autoimmune conditions.  Hepatitis C  Blood testing is recommended for: ? Everyone born from [birth year] through 1967. ? Anyone with known risk factors for hepatitis C.  Sexually transmitted infections (STIs)  You should be screened for sexually transmitted infections (STIs) including gonorrhea and chlamydia if: ? You are sexually active and are younger than 58 years of age. ? You are older than 58 years of age and your health care provider tells you that you are at risk for this type of infection. ? Your sexual activity has changed since you were last screened and you are at an increased risk for chlamydia or gonorrhea. Ask your health care provider if you are at risk.  If you do not have HIV, but are at risk, it may be recommended that you take a prescription medicine daily to prevent HIV infection. This is called pre-exposure prophylaxis (PrEP). You are considered at risk if: ? You are sexually active and do not regularly use condoms or know the HIV status of your partner(s). ? You take drugs by  injection. ? You are sexually active with a partner who has HIV.  Talk with your health care provider about whether you are at high risk of being infected with HIV. If you choose to begin PrEP, you should first be tested for HIV. You should then be tested every 3 months for as long as you are taking PrEP. Pregnancy  If you are premenopausal and you may become pregnant, ask your health care provider about preconception  counseling.  If you may become pregnant, take 400 to 800 micrograms (mcg) of folic acid every day.  If you want to prevent pregnancy, talk to your health care provider about birth control (contraception). Osteoporosis and menopause  Osteoporosis is a disease in which the bones lose minerals and strength with aging. This can result in serious bone fractures. Your risk for osteoporosis can be identified using a bone density scan.  If you are 54 years of age or older, or if you are at risk for osteoporosis and fractures, ask your health care provider if you should be screened.  Ask your health care provider whether you should take a calcium or vitamin D supplement to lower your risk for osteoporosis.  Menopause may have certain physical symptoms and risks.  Hormone replacement therapy may reduce some of these symptoms and risks. Talk to your health care provider about whether hormone replacement therapy is right for you. Follow these instructions at home:  Schedule regular health, dental, and eye exams.  Stay current with your immunizations.  Do not use any tobacco products including cigarettes, chewing tobacco, or electronic cigarettes.  If you are pregnant, do not drink alcohol.  If you are breastfeeding, limit how much and how often you drink alcohol.  Limit alcohol intake to no more than 1 drink per day for nonpregnant women. One drink equals 12 ounces of beer, 5 ounces of wine, or 1 ounces of hard liquor.  Do not use street drugs.  Do not share needles.  Ask  your health care provider for help if you need support or information about quitting drugs.  Tell your health care provider if you often feel depressed.  Tell your health care provider if you have ever been abused or do not feel safe at home. This information is not intended to replace advice given to you by your health care provider. Make sure you discuss any questions you have with your health care provider. Document Released: 09/22/2010 Document Revised: 08/15/2015 Document Reviewed: 12/11/2014 Elsevier Interactive Patient Education  [employer].

## 2017-01-18 NOTE — Progress Notes (Signed)
   Subjective:    Patient ID: [name], female    DOB: [birth year], 58 y.o.   MRN: [id number]  HPI The patient is a 56 YO female coming in for wellness. Having a lot of stressors and still estranged from her daughter as well as loss of 2 of her cats recently.   PMH, [facility], social history reviewed and updated.   Review of Systems  Constitutional: Positive for fatigue.  HENT: Negative.   Eyes: Negative.   Respiratory: Negative for cough, chest tightness and shortness of breath.   Cardiovascular: Negative for chest pain, palpitations and leg swelling.  Gastrointestinal: Positive for abdominal distention and abdominal pain. Negative for constipation, diarrhea, nausea and vomiting.  Musculoskeletal: Positive for myalgias.  Skin: Negative.   Neurological: Negative.   Psychiatric/Behavioral: Positive for behavioral problems, decreased concentration and dysphoric mood. Negative for self-injury, sleep disturbance and suicidal ideas. The patient is nervous/anxious.       Objective:   Physical Exam  Constitutional: She is oriented to person, place, and time. She appears well-developed and well-nourished.  HENT:  Head: Normocephalic and atraumatic.  Eyes: EOM are normal.  Neck: Normal range of motion.  Cardiovascular: Normal rate and regular rhythm.   Pulmonary/Chest: Effort normal and breath sounds normal. No respiratory distress. She has no wheezes. She has no rales.  Abdominal: Soft. Bowel sounds are normal. She exhibits no distension. There is no tenderness. There is no rebound.  Musculoskeletal: She exhibits no edema.  Neurological: She is alert and oriented to person, place, and time. Coordination normal.  Skin: Skin is warm and dry.  Psychiatric: She has a normal mood and affect.  Hard to redirect   Vitals:   01/18/17 1434  BP: 130/88  Pulse: 89  Temp: 97.9 F (36.6 C)  TempSrc: Oral  SpO2: 100%  Weight: 162 lb (73.5 kg)  Height: 5\' 5"  (1.651 m)      Assessment &  Plan:

## 2017-01-18 NOTE — Assessment & Plan Note (Signed)
Colonoscopy up to date, tetanus up to date. Declines flu shot today. Declines mammogram. Declines dexa. Counseled about possibility of counseling alone or with her daughter to work on their differences. She will consider. Counseled about sun safety and mole surveillance. Given screening recommendations.

## 2017-01-19 ENCOUNTER — Other Ambulatory Visit: Payer: Self-pay | Admitting: Internal Medicine

## 2017-01-19 ENCOUNTER — Telehealth: Payer: Self-pay | Admitting: Internal Medicine

## 2017-01-19 MED ORDER — ONDANSETRON 4 MG PO TBDP: 4.0000 mg | ORAL_TABLET | Freq: Three times a day (TID) | ORAL | 0 refills | Status: DC | PRN

## 2017-01-19 MED ORDER — SIMVASTATIN 40 MG PO TABS: 40.0000 mg | ORAL_TABLET | Freq: Every day | ORAL | 3 refills | Status: DC

## 2017-01-19 NOTE — Telephone Encounter (Signed)
Cholesterol medicine sent in.

## 2017-01-22 ENCOUNTER — Ambulatory Visit (INDEPENDENT_AMBULATORY_CARE_PROVIDER_SITE_OTHER): Payer: BLUE CROSS/BLUE SHIELD

## 2017-01-22 DIAGNOSIS — E538 Deficiency of other specified B group vitamins: Secondary | ICD-10-CM | POA: Diagnosis not present

## 2017-01-22 MED ORDER — CYANOCOBALAMIN 1000 MCG/ML IJ SOLN
1000.0000 ug | Freq: Once | INTRAMUSCULAR | Status: AC
  Administered 2017-01-22: 1000 ug via INTRAMUSCULAR

## 2017-01-22 NOTE — Progress Notes (Signed)
B12 Injection given.   [name] [name], MD    

## 2017-01-29 ENCOUNTER — Ambulatory Visit: Payer: Self-pay

## 2017-02-02 ENCOUNTER — Telehealth: Payer: Self-pay | Admitting: Internal Medicine

## 2017-02-02 NOTE — Telephone Encounter (Signed)
Pt called stating that she was suppose to come in on Friday for a B12 injection and continue with one every week for 4 weeks. She missed this appointment and wanted to know if she needed to start the cycle over again or how she should scheduled the missed B12 injection.

## 2017-02-02 NOTE — Telephone Encounter (Signed)
Left message letting pt know. 

## 2017-02-02 NOTE — Telephone Encounter (Signed)
She does not need to start again, just continue with count at whatever week she was at----no need to wait any additional days, just come in as soon as convenient, is she is already late with next injection

## 2017-02-09 ENCOUNTER — Encounter: Payer: Self-pay | Admitting: Sports Medicine

## 2017-02-09 ENCOUNTER — Ambulatory Visit (INDEPENDENT_AMBULATORY_CARE_PROVIDER_SITE_OTHER): Payer: BLUE CROSS/BLUE SHIELD

## 2017-02-09 ENCOUNTER — Ambulatory Visit (INDEPENDENT_AMBULATORY_CARE_PROVIDER_SITE_OTHER): Payer: BLUE CROSS/BLUE SHIELD | Admitting: Sports Medicine

## 2017-02-09 VITALS — BP 130/86 | HR 98 | Ht 65.0 in | Wt 161.2 lb

## 2017-02-09 DIAGNOSIS — M25562 Pain in left knee: Secondary | ICD-10-CM

## 2017-02-09 DIAGNOSIS — M79645 Pain in left finger(s): Secondary | ICD-10-CM

## 2017-02-09 DIAGNOSIS — M19042 Primary osteoarthritis, left hand: Secondary | ICD-10-CM | POA: Diagnosis not present

## 2017-02-09 DIAGNOSIS — F17200 Nicotine dependence, unspecified, uncomplicated: Secondary | ICD-10-CM

## 2017-02-09 DIAGNOSIS — M2242 Chondromalacia patellae, left knee: Secondary | ICD-10-CM

## 2017-02-09 DIAGNOSIS — M19049 Primary osteoarthritis, unspecified hand: Secondary | ICD-10-CM | POA: Insufficient documentation

## 2017-02-09 DIAGNOSIS — G8929 Other chronic pain: Secondary | ICD-10-CM | POA: Diagnosis not present

## 2017-02-09 DIAGNOSIS — M2550 Pain in unspecified joint: Secondary | ICD-10-CM | POA: Diagnosis not present

## 2017-02-09 MED ORDER — CELECOXIB 100 MG PO CAPS: 100.0000 mg | ORAL_CAPSULE | Freq: Two times a day (BID) | ORAL | 2 refills | Status: DC | PRN

## 2017-02-09 NOTE — Assessment & Plan Note (Signed)
Patient has had a significant worsening over the past several months of polyarthralgia complaints and she does have a small amount of delamination on the x-ray of her hand concerning for potential underlying inflammatory process versus CPPD.  Her knees previously demonstrated chondromalacia patella but I am concerned that she may have an acute worsening secondary to her most recent fall and with the worsening now mechanical symptoms and concern for potential meniscal or possibly even a medial collateral ligament given the laxity. For her polyarthralgia I will go ahead and plan to rule out inflammatory arthritis with labs today.  If any elevation in the CRP or ESR we could consider systemic steroid but given her underlying psychiatric history like to try to avoid this if possible.  We will try Celebrex instead at this time.  I will plan to follow-up with her after the MRI of her knee is completed.

## 2017-02-09 NOTE — Progress Notes (Signed)
OFFICE VISIT NOTE [name] at [facility] [phone]  [name] - 56 y.o. female MRN [id number]  Date of birth: 05-12-1960  Visit Date: 02/09/2017  PCP: Hoyt Koch, MD   Referred by: Dayton Bailiff, CMA acting as scribe for Dr. Paulla Fore.  SUBJECTIVE:   Chief Complaint  Patient presents with  . Follow-up    chondromalacia, LT patella  . left thumb pain   HPI: As below and per problem based documentation when appropriate.  Pt presenting today in follow-up of chondromalacia of the left patella and left thumb pain (new issue). She was last seen in the office 12/15/2016 and was advised to continue using topical treatment as well as icing and strengthening. It was recommended that she wear DonJoy Tru Pull brace. Last MRI of the LT knee was 02/24/2014. She had knee inj 09/18/2016.   Pt reports stabbing sensation in the LT knee that seems to be getting worse. Her knee has given out on her several times over the past few weeks. She has been compliant with wearing knee brace. When she does yard work she will also use a compression sleeve or ACE wrap. She is unable to walk long distances without increased knee pain. '  Pt also c/o LT thumb pain. She has been wearing a thumb spica or brace that has a medal rod in it which has caused some irritation over the thumb. She has noticed weakness in the LT hand, she has trouble holding things, even a drink. She has noticed swelling around the joints in her LT hand. She has noticed decreased ROM is the thumb and decreased grip strength.     Review of Systems  Musculoskeletal: Positive for joint pain.    Otherwise per HPI.  HISTORY & PERTINENT PRIOR DATA:  No specialty comments available. She reports that she has been smoking.  She has a 16.50 pack-year smoking history. she has never used smokeless tobacco.  Recent Labs    01/18/17 1519  HGBA1C 5.2     Patient Active Problem List   Diagnosis Date Noted  . Polyarthralgia 02/09/2017  . Pain of left thumb 02/09/2017  . Stomach pain 04/01/2016  . Constipation 04/01/2016  . Fatigue 01/25/2016  . 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 Routine general medical examination at a health care facility 01/09/2014  . Tobacco use disorder 01/09/2014  . Hypothyroidism 10/25/2013  . Anemia, unspecified 10/25/2013  . Genital herpes 06/29/2012   Past Medical History:  Diagnosis Date  . Arthritis   . Back pain   . Bipolar affective disorder ([place])   . Depression   . Genital herpes   . History of chicken pox   . Hypothyroidism    Family History  Problem Relation Age of Onset  . Arthritis Mother   . Cancer Neg Hx   . Diabetes Neg Hx   . Stroke Neg Hx   . Heart disease Neg Hx    Past Surgical History:  Procedure Laterality Date  . CESAREAN SECTION  1993  . COLONOSCOPY     neg  . [place]  2006  . LAPAROSCOPIC SALPINGO OOPHERECTOMY Left 12/19/2014   Procedure: LAPAROSCOPIC LEFT SALPINGO OOPHORECTOMY;  Surgeon: Cheri Fowler, MD;  Location: [facility];  Service: Gynecology;  Laterality: Left;  . LAPAROSCOPY N/A 12/19/2014   Procedure: LAPAROSCOPY OPERATIVE;  Surgeon: Cheri Fowler, MD;  Location: [facility];  Service: Gynecology;  Laterality: N/A;  .  TONSILLECTOMY  1981  . TUBAL LIGATION  1994   Social History   Occupational History  . Occupation: Retired  Tobacco Use  . Smoking status: Current Every Day Smoker    Packs/day: 0.50    Years: 33.00    Pack years: 16.50  . Smokeless tobacco: Never Used  . Tobacco comment: smoked 1983- present , up to 1/3 ppd  Substance and Sexual Activity  . Alcohol use: Yes  . Drug use: No  . Sexual activity: Yes   Allergies  Allergen Reactions  . Bee Venom Anaphylaxis   Outpatient Medications Prior to Visit  Medication Sig Dispense Refill  . alprazolam (XANAX) 2 MG tablet Take 1 tablet (2 mg total) by mouth 3 (three) times daily as  needed for anxiety. 90 tablet 2  . diclofenac sodium (VOLTAREN) 1 % GEL Apply topically to affected area qid 100 g 3  . GLUCOSAMINE HCL PO Take 1 tablet by mouth 2 (two) times daily.    Marland Kitchen levothyroxine (SYNTHROID, LEVOTHROID) 50 MCG tablet TAKE 1 TABLET(50 MCG) BY MOUTH DAILY BEFORE BREAKFAST 90 tablet 3  . pantoprazole (PROTONIX) 40 MG tablet Take 1 tablet (40 mg total) by mouth daily. Need annual visit for refills 30 tablet 0  . phentermine 37.5 MG capsule Take 37.5 mg by mouth. Take two by mouth in the morning.    . simvastatin (ZOCOR) 40 MG tablet Take 1 tablet (40 mg total) by mouth at bedtime. 90 tablet 3  . VITAMIN D, CHOLECALCIFEROL, PO Take 1,000 Units by mouth daily.     Marland Kitchen zolpidem (AMBIEN) 5 MG tablet Take 5 mg by mouth at bedtime as needed for sleep.    Marland Kitchen EPINEPHrine 0.15 MG/0.15ML IJ injection Inject 0.15 mLs (0.15 mg total) into the muscle as needed for anaphylaxis. (Patient not taking: Reported on 02/09/2017) 2 Device 1  . diazepam (VALIUM) 10 MG tablet Take 1 tablet by mouth 4 (four) times daily as needed. anxiety  0  . ondansetron (ZOFRAN ODT) 4 MG disintegrating tablet Take 1 tablet (4 mg total) by mouth every 8 (eight) hours as needed for nausea or vomiting. 20 tablet 0   No facility-administered medications prior to visit.      OBJECTIVE:  VS:  HT:'5\' 5"'  (165.1 cm)   WT:161 lb 3.2 oz (73.1 kg)  BMI:26.83    BP:130/86  HR:98bpm  TEMP: ( )  RESP:98 % EXAM: Findings:  Adult female.  Tangential thought processing and pressured speech.  Left knee overall well aligned.  She has a small nodular area over the anterior lateral aspect of the knee that is slightly tender to palpation.  No effusion.  She has 3-4 mm of opening with a poor endpoint with valgus stressing and pain with McMurray's that is quite severe with medial rotation although she does have well-preserved range of motion and improved flexion extension while distracted.  Left hand is overall well aligned she does  have some increased prominence of the vasculature in the left greater than right hand and small amount of skin tightening.  Grip strength is intact.  She has a small amount of pain with axial load of the first MCP but this is mild.  CMC is slightly tender to palpation but no focal pain.  No significant wrist joint effusion.  No pain with Wynn Maudlin testing while distracted.  No anatomic snuffbox tenderness.    ASSESSMENT & PLAN:     ICD-10-CM   1. Pain of left thumb M79.645 DG Hand Complete Left  2. Tobacco use disorder F17.200   3. Chondromalacia, patella, left M22.42 MR Knee Left  Wo Contrast  4. Chronic pain of left knee M25.562 MR Knee Left  Wo Contrast   G89.29   5. Polyarthralgia M25.50 VITAMIN D 25 Hydroxy (Vit-D Deficiency, Fractures)    Uric acid    Sedimentation rate    C-reactive protein    Rheumatoid factor    Cyclic citrul peptide antibody, IgG    ANA    MR Knee Left  Wo Contrast    CANCELED: CBC with Differential/Platelet    CANCELED: Comprehensive metabolic panel    CANCELED: TSH   ================================================================= Polyarthralgia Patient has had a significant worsening over the past several months of polyarthralgia complaints and she does have a small amount of delamination on the x-ray of her hand concerning for potential underlying inflammatory process versus CPPD.  Her knees previously demonstrated chondromalacia patella but I am concerned that she may have an acute worsening secondary to her most recent fall and with the worsening now mechanical symptoms and concern for potential meniscal or possibly even a medial collateral ligament given the laxity. For her polyarthralgia I will go ahead and plan to rule out inflammatory arthritis with labs today.  If any elevation in the CRP or ESR we could consider systemic steroid but given her underlying psychiatric history like to try to avoid this if possible.  We will try Celebrex instead at this  time.  I will plan to follow-up with her after the MRI of her knee is completed.  No notes on file ================================================================= Patient Instructions  We are checking labs and ordering an MRI.   Try the medication we sent in today. We should have the lab results back after the holiday.  If there is any evidence of increased inflammation we can discuss sending in a prescription for steroid.  =================================================================  Follow-up: Return for MRI review in person.   CMA/ATC served as Education administrator during this visit. History, Physical, and Plan performed by medical provider. Documentation and orders reviewed and attested to.      Teresa Coombs, [place] [facility] Physician

## 2017-02-09 NOTE — Patient Instructions (Signed)
We are checking labs and ordering an MRI.   Try the medication we sent in today. We should have the lab results back after the holiday.  If there is any evidence of increased inflammation we can discuss sending in a prescription for steroid.

## 2017-02-10 LAB — RHEUMATOID FACTOR: Rhuematoid fact SerPl-aCnc: <14 IU/mL (ref <14)

## 2017-02-10 LAB — ANA: Anti Nuclear Antibody(ANA): NEGATIVE

## 2017-02-10 LAB — SEDIMENTATION RATE: Sed Rate: 14 mm/hr (ref 0–30)

## 2017-02-10 LAB — C-REACTIVE PROTEIN: CRP: 0.2 mg/dL — AB (ref 0.5–20.0)

## 2017-02-10 LAB — VITAMIN D 25 HYDROXY (VIT D DEFICIENCY, FRACTURES): VITD: 28.82 ng/mL — AB (ref 30.00–100.00)

## 2017-02-10 LAB — CYCLIC CITRUL PEPTIDE ANTIBODY, IGG: Cyclic Citrullin Peptide Ab: <16 UNITS

## 2017-02-10 LAB — URIC ACID: Uric Acid, Serum: 2.7 mg/dL (ref 2.4–7.0)

## 2017-02-18 DIAGNOSIS — Z130 Encounter for screening for diseases of the blood and blood-forming organs and certain disorders involving the immune mechanism: Secondary | ICD-10-CM | POA: Diagnosis not present

## 2017-02-18 DIAGNOSIS — Z124 Encounter for screening for malignant neoplasm of cervix: Secondary | ICD-10-CM | POA: Diagnosis not present

## 2017-02-18 DIAGNOSIS — Z1231 Encounter for screening mammogram for malignant neoplasm of breast: Secondary | ICD-10-CM | POA: Diagnosis not present

## 2017-02-18 DIAGNOSIS — Z6826 Body mass index (BMI) 26.0-26.9, adult: Secondary | ICD-10-CM | POA: Diagnosis not present

## 2017-02-18 DIAGNOSIS — Z01419 Encounter for gynecological examination (general) (routine) without abnormal findings: Secondary | ICD-10-CM | POA: Diagnosis not present

## 2017-02-18 DIAGNOSIS — Z780 Asymptomatic menopausal state: Secondary | ICD-10-CM | POA: Diagnosis not present

## 2017-02-18 DIAGNOSIS — Z1151 Encounter for screening for human papillomavirus (HPV): Secondary | ICD-10-CM | POA: Diagnosis not present

## 2017-02-18 DIAGNOSIS — Z1389 Encounter for screening for other disorder: Secondary | ICD-10-CM | POA: Diagnosis not present

## 2017-02-19 DIAGNOSIS — Z124 Encounter for screening for malignant neoplasm of cervix: Secondary | ICD-10-CM | POA: Diagnosis not present

## 2017-02-20 ENCOUNTER — Other Ambulatory Visit: Payer: Self-pay | Admitting: Internal Medicine

## 2017-02-22 ENCOUNTER — Ambulatory Visit
Admission: RE | Admit: 2017-02-22 | Discharge: 2017-02-22 | Disposition: A | Discharge status: Home / Self Care | Payer: BLUE CROSS/BLUE SHIELD | Source: Ambulatory Visit | Attending: Sports Medicine | Admitting: Sports Medicine

## 2017-02-22 DIAGNOSIS — G8929 Other chronic pain: Secondary | ICD-10-CM

## 2017-02-22 DIAGNOSIS — M25562 Pain in left knee: Secondary | ICD-10-CM

## 2017-02-22 DIAGNOSIS — M2242 Chondromalacia patellae, left knee: Secondary | ICD-10-CM

## 2017-02-22 DIAGNOSIS — M2550 Pain in unspecified joint: Secondary | ICD-10-CM

## 2017-02-23 ENCOUNTER — Encounter: Payer: Self-pay | Admitting: Internal Medicine

## 2017-02-23 NOTE — Progress Notes (Unsigned)
Abstracted and sent to scan  

## 2017-02-25 ENCOUNTER — Ambulatory Visit (INDEPENDENT_AMBULATORY_CARE_PROVIDER_SITE_OTHER): Payer: BLUE CROSS/BLUE SHIELD | Admitting: Sports Medicine

## 2017-02-25 ENCOUNTER — Encounter: Payer: Self-pay | Admitting: Sports Medicine

## 2017-02-25 VITALS — BP 102/70 | HR 85 | Ht 65.0 in | Wt 161.8 lb

## 2017-02-25 DIAGNOSIS — M4726 Other spondylosis with radiculopathy, lumbar region: Secondary | ICD-10-CM

## 2017-02-25 DIAGNOSIS — M2242 Chondromalacia patellae, left knee: Secondary | ICD-10-CM

## 2017-02-25 DIAGNOSIS — M79605 Pain in left leg: Secondary | ICD-10-CM | POA: Diagnosis not present

## 2017-02-25 DIAGNOSIS — F17200 Nicotine dependence, unspecified, uncomplicated: Secondary | ICD-10-CM | POA: Diagnosis not present

## 2017-02-25 DIAGNOSIS — M79645 Pain in left finger(s): Secondary | ICD-10-CM

## 2017-02-25 MED ORDER — GABAPENTIN 300 MG PO CAPS: ORAL_CAPSULE | ORAL | 1 refills | Status: DC

## 2017-02-25 NOTE — Progress Notes (Signed)
[name]. [name], [facility]  [name] - 55 y.o. female MRN [id number]  Date of birth: [birth year]   Scribe for today's visit: Josepha Pigg, CMA    SUBJECTIVE:  [name] is here for Follow-up (review MRI LT knee)  02/09/2017 - Pt presenting today in follow-up of chondromalacia of the left patella. She was last seen in the office 12/15/2016 and was advised to continue using topical treatment as well as icing and strengthening. It was recommended that she wear DonJoy Tru Pull brace. Last MRI of the LT knee was 02/24/2014. She had knee inj 09/18/2016.  Pt reports stabbing sensation in the LT knee that seems to be getting worse. Her knee has given out on her several times over the past few weeks. She has been compliant with wearing knee brace. When she does yard work she will also use a compression sleeve or ACE wrap. She is unable to walk long distances without increased knee pain.     Compared to the last office visit, her previously described symptoms are worsening. She has fallen twice since her last visit d/t her knee giving out.  Current symptoms are moderate & are nonradiating She has been wearing her knee brace while being active.   MRI review IMPRESSION: 1. No meniscal or ligamentous injury of the left knee. 2. Partial-thickness cartilage loss of the medial patellofemoral Compartment.   She continues to have weakness, swelling, and pain in the LT thumb. Celebrex is helping with her thumb and knee pain.    ROS Reports night time disturbances. Denies fevers, chills, or night sweats. Denies unexplained weight loss. Denies personal history of cancer. Denies changes in bowel or bladder habits. Reports recent unreported falls. Denies new or worsening dyspnea or wheezing. Denies headaches or dizziness.  Reports numbness, tingling or weakness  In the extremities.  Denies dizziness or presyncopal  episodes Reports lower extremity edema     HISTORY & PERTINENT PRIOR DATA:  Prior History reviewed and updated per electronic medical record.  Significant history, findings, studies and interim changes include:  reports that she has been smoking.  She has a 16.50 pack-year smoking history. she has never used smokeless tobacco. Recent Labs    01/18/17 1519 02/09/17 1440  HGBA1C 5.2  --   LABURIC  --  2.7   No specialty comments available. Problem  Osteoarthritis of Spine With Radiculopathy, Lumbar Region  Pain of Left Thumb  Chondromalacia, Patella, Left     OBJECTIVE:  VS:  HT:5\' 5"  (165.1 cm)   WT:161 lb 12.8 oz (73.4 kg)  BMI:26.92    BP:102/70  HR:85bpm  TEMP: ( )  RESP:98 %  PHYSICAL EXAM: Constitutional: WDWN, Non-toxic appearing. Psychiatric: Alert & appropriately interactive. Not depressed or anxious appearing. Respiratory: No increased work of breathing. Trachea Midline Eyes: Pupils are equal. EOM intact without nystagmus. No scleral icterus Cardiovascular:  Peripheral Pulses: peripheral pulses symmetrical No clubbing or cyanosis appreciated Capillary Refill is normal, less than 2 seconds No signficant generalized edema/anasarca Sensory Exam: intact to light touch  Left Knee Exam: No significant rashes/lesions/ulcerations overlying the examined area No overlying erythema/ecchymosis. Alignment & Contours: normal Gait: antalgic Effusion: None   Generalized Synovitis: no  Tenderness to palpation over: medial facet of the patella, lateral facet of the patella and generalized hyperesthesia ROM: normal and No significant limitations Strength: Extensor mechanism intact.  Hip abduction weak.  Lower extremity strength is otherwise 5 out  of 5  Ligamentous testing: Stable to varus/valgus strain Stable to anterior/posterior drawer Mcmurray's: Persistent pain but no mechanical clicking Thessaly: Persistent pain but no mechanical clicking  Back & Lower  Extremities:  Tightness but no true radicular symptoms with straight leg raise  No significant midline tenderness.    Generalized SI joint pain  Good internal and external rotation of the hips.  Manual muscle testing is 5+/5 in BLE myotomes without focality  Lower extremity DTRs 2+/4 diffusely and symmetric  No additional findings.   ASSESSMENT & PLAN:   1. Leg pain, anterior, left   2. Tobacco use disorder   3. Chondromalacia, patella, left   4. Pain of left thumb   5. Osteoarthritis of spine with radiculopathy, lumbar region    PLAN:    Osteoarthritis of spine with radiculopathy, lumbar region Likely significant underlying osteoarthritis that is causing likely dynamic radiculitis.  Core stabilization exercises and leg strengthening exercises will be critical to help with her.  If any lack of improvement can consider further diagnostic evaluation with MRI of the lumbar spine follow-up after 8 weeks of physical therapy, gabapentin for potential neurogenic component  Chondromalacia, patella, left MRI is reassuring that no new significant changes.  She does have focal chondral loss of the patellar facets and could be a candidate for diagnostic arthroscopy and consideration of partial knee arthroplasty if indicated but further conservative management indicated at this time.  Pain of left thumb Some mild left anterior thumb pain that is nonfocal.  Some underlying degenerative changes are appreciated but concerned for potential radiculitis versus carpal tunnel.  If any persistent or worsening symptoms further diagnostic evaluation indicated at this time but will plan to see how she does with core conditioning with physical therapy.   ++++++++++++++++++++++++++++++++++++++++++++ Orders & Meds: Orders Placed This Encounter  Procedures  . Ambulatory referral to Physical Therapy    Meds ordered this encounter  Medications  . gabapentin (NEURONTIN) 300 MG capsule    Sig: Start with  1 tab po qhs X 1 week, then increase to 1 tab po bid X 1 week then 1 tab po tid prn    Dispense:  90 capsule    Refill:  1    ++++++++++++++++++++++++++++++++++++++++++++ Follow-up: Return in about 6 weeks (around 04/08/2017).   Pertinent documentation may be included in additional procedure notes, imaging studies, problem based documentation and patient instructions. Please see these sections of the encounter for additional information regarding this visit. CMA/ATC served as Education administrator during this visit. History, Physical, and Plan performed by medical provider. Documentation and orders reviewed and attested to.      [name], [place] [facility] Physician

## 2017-03-03 NOTE — Assessment & Plan Note (Signed)
MRI is reassuring that no new significant changes.  She does have focal chondral loss of the patellar facets and could be a candidate for diagnostic arthroscopy and consideration of partial knee arthroplasty if indicated but further conservative management indicated at this time.

## 2017-03-03 NOTE — Assessment & Plan Note (Signed)
Some mild left anterior thumb pain that is nonfocal.  Some underlying degenerative changes are appreciated but concerned for potential radiculitis versus carpal tunnel.  If any persistent or worsening symptoms further diagnostic evaluation indicated at this time but will plan to see how she does with core conditioning with physical therapy.

## 2017-03-03 NOTE — Assessment & Plan Note (Signed)
Likely significant underlying osteoarthritis that is causing likely dynamic radiculitis.  Core stabilization exercises and leg strengthening exercises will be critical to help with her.  If any lack of improvement can consider further diagnostic evaluation with MRI of the lumbar spine follow-up after 8 weeks of physical therapy, gabapentin for potential neurogenic component

## 2017-03-04 ENCOUNTER — Ambulatory Visit: Payer: Self-pay

## 2017-03-11 ENCOUNTER — Ambulatory Visit (INDEPENDENT_AMBULATORY_CARE_PROVIDER_SITE_OTHER): Payer: BLUE CROSS/BLUE SHIELD | Admitting: Physical Therapy

## 2017-03-11 ENCOUNTER — Encounter: Payer: Self-pay | Admitting: Physical Therapy

## 2017-03-11 DIAGNOSIS — R2689 Other abnormalities of gait and mobility: Secondary | ICD-10-CM | POA: Diagnosis not present

## 2017-03-11 DIAGNOSIS — G8929 Other chronic pain: Secondary | ICD-10-CM | POA: Diagnosis not present

## 2017-03-11 DIAGNOSIS — M25562 Pain in left knee: Secondary | ICD-10-CM | POA: Diagnosis not present

## 2017-03-11 DIAGNOSIS — M545 Low back pain: Secondary | ICD-10-CM | POA: Diagnosis not present

## 2017-03-11 NOTE — Patient Instructions (Addendum)
  Knee to Chest (Flexion)   Pull knee toward chest. Feel stretch in lower back or buttock area. Breathing deeply, Hold _30__ seconds. Repeat with other knee. Repeat __3__ times. Do __2-3__ sessions per day.   Lumbar Rotation: Caudal - Bilateral (Supine)   Feet and knees together, arms outstretched, rock knees left and right, staying within shoulder distance ( man in picture is going too far) ,relaxing muscles of low back. Perform for 30 seconds. Relax. Repeat _3___ times per set.  Do __2-3__ sessions per day.  Piriformis (Supine)   Cross legs, right on top. Gently pull other knee toward chest until stretch is felt in buttock/hip of top leg. Hold _30___ seconds. Repeat ___3_ times per set.  Do __2-3__ sessions per day.

## 2017-03-11 NOTE — Therapy (Addendum)
[facility] [address] [place], [place], [address] Phone: [phone]   Fax:  [phone]  Physical Therapy Evaluation  Patient Details  Name: [name] MRN: [id number] Date of Birth: Mar 11, 1961 Referring Provider: Dr. Teresa Coombs   Encounter Date: 03/11/2017  PT End of Session - 03/11/17 1340    Visit Number  1    Number of Visits  12    Date for PT Re-Evaluation  04/22/17    Authorization Type  BC[employer]    PT Start Time  1257    PT Stop Time  1334    PT Time Calculation (min)  37 min    Activity Tolerance  Patient tolerated treatment well    Behavior During Therapy  [facility] for tasks assessed/performed       Past Medical History:  Diagnosis Date  . Arthritis   . Back pain   . Bipolar affective disorder ([place])   . Depression   . Genital herpes   . History of chicken pox   . Hypothyroidism     Past Surgical History:  Procedure Laterality Date  . CESAREAN SECTION  1993  . COLONOSCOPY     neg  . Quail  2006  . LAPAROSCOPIC SALPINGO OOPHERECTOMY Left 12/19/2014   Procedure: LAPAROSCOPIC LEFT SALPINGO OOPHORECTOMY;  Surgeon: Cheri Fowler, MD;  Location: [facility];  Service: Gynecology;  Laterality: Left;  . LAPAROSCOPY N/A 12/19/2014   Procedure: LAPAROSCOPY OPERATIVE;  Surgeon: Cheri Fowler, MD;  Location: [facility];  Service: Gynecology;  Laterality: N/A;  . TONSILLECTOMY  1981  . TUBAL LIGATION  1994    There were no vitals filed for this visit.   Subjective Assessment - 03/11/17 1302    Subjective  Pt is a 56 y/o female who presents to [facility] for LBP x 30 years, with recent exacerbation in summer of 2017.  Pt also reports Lt knee pain x 3 years.     Pertinent History  see snapshot    Limitations  Standing    How long can you stand comfortably?  15 min    How long can you walk comfortably?  15 min    Patient Stated Goals  "feel better" wants to be able to walk dog    Currently in Pain?  Yes    Pain  Score  6     Pain Location  Back    Pain Orientation  Lower    Pain Descriptors / Indicators  Sore    Pain Type  Chronic pain    Pain Onset  More than a month ago    Pain Frequency  Intermittent    Aggravating Factors   carrying heavy items (buckets of water), pushing heavy objects, standing    Pain Relieving Factors  heating pad, ibuprofen, lying supine    Multiple Pain Sites  Yes    Pain Score  8    Pain Location  Knee    Pain Orientation  Left    Pain Descriptors / Indicators  Stabbing    Pain Type  Chronic pain    Pain Onset  More than a month ago    Pain Frequency  Intermittent    Aggravating Factors   cold/rain, "any exertion on it"    Pain Relieving Factors  voltaren gel         [facility] PT Assessment - 03/11/17 1307      Assessment   Medical Diagnosis  LBP, Lt knee pain    Referring  Provider  Dr. Teresa Coombs    Onset Date/Surgical Date  -- 1984    Next MD Visit  04/21/16    Prior Therapy  [facility] (2015/2016), [facility] (2016)      Precautions   Precautions  None      Restrictions   Weight Bearing Restrictions  No      Balance Screen   Has the patient fallen in the past 6 months  Yes    How many times?  3-4 times a month ("for no reason"    Has the patient had a decrease in activity level because of a fear of falling?   Yes    Is the patient reluctant to leave their home because of a fear of falling?   Yes "I'm a Human resources officer  Private residence    [place]  Parent 33 y/o mother    Type of [place] to enter    Entrance Stairs-Number of Steps  2    Entrance Stairs-Rails  None holds onto post    [place]  One level with basement    Additional Comments  dog, [place] pig, cat (3), turtle      Prior Function   Level of Independence  Independent    Vocation  Unemployed hasn't worked since 2015, applying for disability    Leisure  Geographical information systems officer   Overall  Cognitive Status  Within Functional Limits for tasks assessed      Posture/Postural Control   Posture/Postural Control  Postural limitations    Postural Limitations  Rounded Shoulders;Forward head      ROM / Strength   AROM / PROM / Strength  AROM;Strength      AROM   AROM Assessment Site  Lumbar;Knee    Right/Left Knee  Left    Left Knee Extension  -2    Left Knee Flexion  125    Lumbar Flexion  108    Lumbar Extension  17    Lumbar - Right Side Bend  27 "pulling on Rt side"    Lumbar - Left Side Bend  27      Strength   Strength Assessment Site  Hip;Knee;Ankle    Right/Left Hip  Right;Left    Right Hip Flexion  4/5    Right Hip Extension  3-/5    Right Hip ABduction  3/5    Left Hip Flexion  3/5    Left Hip Extension  3/5    Left Hip ABduction  3+/5    Right/Left Knee  Right;Left    Right Knee Flexion  3/5    Right Knee Extension  5/5    Left Knee Flexion  4/5    Left Knee Extension  3+/5    Right/Left Ankle  Right;Left    Right Ankle Dorsiflexion  5/5    Left Ankle Dorsiflexion  5/5      Flexibility   Soft Tissue Assessment /Muscle Length  yes    Hamstrings  WNL    Piriformis  tightness bil    Quadratus Lumborum  tightness bil      Palpation   Patella mobility  hypomobile Lt patella with lateral tracking; poor quad set    Palpation comment  pt very tender to palpation requesting PT avoid touching lateral knee      Special Tests    Special Tests  Lumbar    Lumbar Tests  Straight Leg Raise      Straight Leg Raise   Findings  Negative      Ambulation/Gait   Gait Pattern  Decreased hip/knee flexion - left;Trendelenburg             Objective measurements completed on examination: See above findings.              PT Education - 03/11/17 1338    Education provided  Yes    Education Details  HEP    Person(s) Educated  Patient    Methods  Explanation;Handout;Demonstration    Comprehension  Verbalized understanding          PT Long  Term Goals - 03/11/17 1627      PT LONG TERM GOAL #1   Title  independent with HEP    Status  New    Target Date  04/22/17      PT LONG TERM GOAL #2   Title  report ability to walk > 10 min without increase in pain for improved function    Status  New    Target Date  04/22/17      PT LONG TERM GOAL #3   Title  demonstrate 4/5 bil LE strength for improved function and strength    Status  New    Target Date  04/22/17             Plan - 03/11/17 1620    Clinical Impression Statement  Pt is a 56 y/o female who presents to [facility] with c/o LBP and Lt knee pain.  Pt needed frequent redirection throughout eval and was tearful at times.  Pt demonstrates decreased strength and ROM, poor postural awareness, and poor core strength affecting functional mobility.  Pt will benefit from PT to address deficits listed.      History and Personal Factors relevant to plan of care:  OA lumbar spine, chondromalacia Lt patella, Bipolar, depression    Clinical Presentation  Evolving    Clinical Presentation due to:  hx of falls, increasing pain    Clinical Decision Making  Moderate    Rehab Potential  Fair    PT Frequency  2x / week may have to decrease to 1x/wk pending finances    PT Duration  6 weeks    PT Treatment/Interventions  ADLs/Self Care Home Management;Moist Heat;Electrical Stimulation;Cryotherapy;Ultrasound;Therapeutic activities;Gait training;Therapeutic exercise;Stair training;Balance training;Neuromuscular re-education;Patient/family education;Manual techniques;Taping;Dry needling;Vasopneumatic Device;Passive range of motion    PT Next Visit Plan  establish HEP for core/hip/knee strengthening, review stretches    Consulted and Agree with Plan of Care  Patient       Patient will benefit from skilled therapeutic intervention in order to improve the following deficits and impairments:  Abnormal gait, Decreased balance, Difficulty walking, Increased muscle spasms, Pain, Postural dysfunction,  Impaired flexibility, Increased fascial restricitons, Decreased strength, Decreased activity tolerance  Visit Diagnosis: Chronic midline low back pain without sciatica - Plan: PT plan of care cert/re-cert  Chronic pain of left knee - Plan: PT plan of care cert/re-cert  Other abnormalities of gait and mobility - Plan: PT plan of care cert/re-cert     Problem List Patient Active Problem List   Diagnosis Date Noted  . Osteoarthritis of spine with radiculopathy, lumbar region 02/25/2017  . Polyarthralgia 02/09/2017  . Pain of left thumb 02/09/2017  . Stomach pain 04/01/2016  . Constipation 04/01/2016  . Fatigue 01/25/2016  . 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 Routine general medical  examination at a health care facility 01/09/2014  . Tobacco use disorder 01/09/2014  . Hypothyroidism 10/25/2013  . Anemia, unspecified 10/25/2013  . Genital herpes 06/29/2012      Laureen Abrahams, PT, DPT 03/11/17 4:30 PM    [facility] [place], [place], [address] Phone: [phone]   Fax:  [phone]  Name: [name] MRN: [id number] Date of Birth: June 20, 1960   .PHYSICAL THERAPY DISCHARGE SUMMARY  Visits from Start of Care:1  Plan: Patient agrees to discharge.  Patient goals were not met. Patient is being discharged due to not returning since the last visit.  ?????      Lyndee Hensen, PT, DPT 3:58 PM  04/22/17

## 2017-03-19 DIAGNOSIS — Z23 Encounter for immunization: Secondary | ICD-10-CM | POA: Diagnosis not present

## 2017-03-24 ENCOUNTER — Ambulatory Visit: Payer: Self-pay

## 2017-03-25 ENCOUNTER — Ambulatory Visit: Payer: Self-pay | Admitting: Sports Medicine

## 2017-04-21 ENCOUNTER — Ambulatory Visit: Payer: Self-pay | Admitting: Sports Medicine

## 2017-04-21 ENCOUNTER — Encounter: Payer: Self-pay | Admitting: Sports Medicine

## 2017-04-21 ENCOUNTER — Ambulatory Visit (INDEPENDENT_AMBULATORY_CARE_PROVIDER_SITE_OTHER): Payer: BLUE CROSS/BLUE SHIELD | Admitting: Sports Medicine

## 2017-04-21 VITALS — BP 130/74 | HR 86 | Ht 65.0 in | Wt 165.4 lb

## 2017-04-21 DIAGNOSIS — M4726 Other spondylosis with radiculopathy, lumbar region: Secondary | ICD-10-CM | POA: Diagnosis not present

## 2017-04-21 DIAGNOSIS — G8929 Other chronic pain: Secondary | ICD-10-CM

## 2017-04-21 DIAGNOSIS — M545 Low back pain: Secondary | ICD-10-CM | POA: Diagnosis not present

## 2017-04-21 DIAGNOSIS — M2242 Chondromalacia patellae, left knee: Secondary | ICD-10-CM

## 2017-04-21 DIAGNOSIS — M79645 Pain in left finger(s): Secondary | ICD-10-CM | POA: Diagnosis not present

## 2017-04-21 DIAGNOSIS — R2689 Other abnormalities of gait and mobility: Secondary | ICD-10-CM | POA: Diagnosis not present

## 2017-04-21 DIAGNOSIS — M19049 Primary osteoarthritis, unspecified hand: Secondary | ICD-10-CM | POA: Diagnosis not present

## 2017-04-21 DIAGNOSIS — M2550 Pain in unspecified joint: Secondary | ICD-10-CM

## 2017-04-21 NOTE — Assessment & Plan Note (Addendum)
Left knee pain has continued to be bothersome for her.  Overall this is not had any significant objective findings and she has had poor response to corticosteroid injections in the past.  Prior MRI per overview.  Continue with bracing as this has been effective for her and continue with strengthening exercises.  Can consider Visco supplementation but at this time she is not interested she has had a poor response to injections in the past.  Can continue with topical diclofenac as needed

## 2017-04-21 NOTE — Assessment & Plan Note (Signed)
Small amount of pain that has been exacerbated by her trying to strengthen this with a grip strength ball.  Recommend avoiding this as well as daily frequent use of Artie prescribed topical Voltaren gel.  EXOS CMC brace from [employer] provided today

## 2017-04-21 NOTE — Assessment & Plan Note (Signed)
Acute flareup over the weekend but is now returned to her normal baseline.  Recommend continuing with home therapeutic exercises and would prefer formal physical therapy but at this time she cannot for this.

## 2017-04-21 NOTE — Progress Notes (Signed)
[name] [place], [place] [facility] [place]  JOVEE DETTINGER - 57 y.o. female MRN [id number]  Date of birth: 02/21/1961  Visit Date: 04/21/2017  PCP: Hoyt Koch, MD   Referred by: Hoyt Koch, *   Scribe for today's visit: Wendy Poet, LAT, ATC     SUBJECTIVE:  [name] is here for Follow-up (L knee pain and L thumb pain) .   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 She was last seen in the office 12/15/2016 and was advised to continue using topical treatment as well as icing and strengthening. It was recommended that she wear [employer] Tru Pull brace. Last MRI of the LT knee was 02/24/2014. She had knee inj 09/18/2016.  Pt reports stabbing sensation in the LT knee that seems to be getting worse. Her knee has given out on her several times over the past few weeks. She has been compliant with wearing knee brace. When she does yard work she will also use a compression sleeve or ACE wrap. She is unable to walk long distances without increased knee pain.     Compared to the last office visit, her previously described symptoms are worsening. She has fallen twice since her last visit d/t her knee giving out.  Current symptoms are moderate & are nonradiating She has been wearing her knee brace while being active.   MRI review IMPRESSION: 1. No meniscal or ligamentous injury of the left knee. 2. Partial-thickness cartilage loss of the medial patellofemoral Compartment.   She continues to have weakness, swelling, and pain in the LT thumb. Celebrex is helping with her thumb and knee pain.    Compared to the last office visit on 02/25/17, her previously described L knee and L thumb symptoms show no change  Current symptoms are mod - severe & are nonradiating She has been doing her HEP per her visit w/ [name] but states that she "threw her back out" last week when she  was trying to do the exercises at home.   ROS Denies night time disturbances. Denies fevers, chills, or night sweats. Denies unexplained weight loss. Denies personal history of cancer. Denies changes in bowel or bladder habits. Reports recent unreported falls.  Fell last week when her L knee gave way. Denies new or worsening dyspnea or wheezing. Reports headaches or dizziness. Headaches due to stress. Reports numbness, tingling or weakness  In the extremities - in the L thumb Denies dizziness or presyncopal episodes Denies lower extremity edema     HISTORY & PERTINENT PRIOR DATA:  Prior History reviewed and updated per electronic medical record.  Significant history, findings, studies and interim changes include:  reports that she has been smoking.  She has a 16.50 pack-year smoking history. she has never used smokeless tobacco. Recent Labs    01/18/17 1519 02/09/17 1440  HGBA1C 5.2  --   LABURIC  --  2.7   No specialty comments available. 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      OBJECTIVE:  VS:  HT:5\' 5"  (165.1 cm)   WT:165 lb 6.4 oz (75 kg)  BMI:27.52    BP:130/74  HR:86bpm  TEMP: ( )  RESP:98 %   PHYSICAL EXAM: Constitutional: WDWN, Non-toxic appearing. Psychiatric: Speech is actually slightly less pressure than in the past.  She does report being off of several medications reviewed per review.  Has follow-up with psychiatry next week. Alert & appropriately interactive.  Not depressed or anxious appearing. Respiratory: No increased work of breathing.  Trachea Midline Eyes: Pupils are equal.  EOM intact without nystagmus.  No scleral icterus  NEUROVASCULAR exam: No clubbing or  cyanosis appreciated No significant venous stasis changes Capillary Refill: normal, less than 2 seconds    Left thumb is well aligned with moderate pain over the first [place].  Good flexion and extension otherwise.  Ligamentously she is stable.  Grip strength is intact.  No significant pain with carpal tunnel compression test and Phalen's.  Left knee is well aligned.  She has a small amount of swelling over the anterior lateral knee that is chronic.  Ligamentously she is stable.  No significant pain with McMurray's.  No significant pain with patellar grind and no significant effusion No additional findings.   ASSESSMENT & PLAN:   1. Chronic midline low back pain without sciatica   2. Other abnormalities of gait and mobility   3. Osteoarthritis of spine with radiculopathy, lumbar region   4. Chondromalacia, patella, left   5. Polyarthralgia   6. Pain of left thumb    PLAN:    CMC arthritis Small amount of pain that has been exacerbated by her trying to strengthen this with a grip strength ball.  Recommend avoiding this as well as daily frequent use of Artie prescribed topical Voltaren gel.  EXOS CMC brace from [employer] provided today  Osteoarthritis of spine with radiculopathy, lumbar region Acute flareup over the weekend but is now returned to her normal baseline.  Recommend continuing with home therapeutic exercises and would prefer formal physical therapy but at this time she cannot for this.   Chondromalacia, patella, left Left knee pain has continued to be bothersome for her.  Overall this is not had any significant objective findings and she has had poor response to corticosteroid injections in the past.  Prior MRI per overview.  Continue with bracing as this has been effective for her and continue with strengthening exercises.  Can consider Visco supplementation but at this time she is not interested she has had a poor response to injections in the past.  Can continue with topical  diclofenac as needed   ++++++++++++++++++++++++++++++++++++++++++++ Orders & Meds: No orders of the defined types were placed in this encounter.   No orders of the defined types were placed in this encounter.   ++++++++++++++++++++++++++++++++++++++++++++ Follow-up: Return in about 12 weeks (around 07/14/2017).   Pertinent documentation may be included in additional procedure notes, imaging studies, problem based documentation and patient instructions. Please see these sections of the encounter for additional information regarding this visit. CMA/ATC served as Education administrator during this visit. History, Physical, and Plan performed by medical provider. Documentation and orders reviewed and attested to.      [name], [facility] Physician

## 2017-05-03 ENCOUNTER — Ambulatory Visit (HOSPITAL_COMMUNITY): Payer: BLUE CROSS/BLUE SHIELD | Admitting: Psychiatry

## 2017-06-15 ENCOUNTER — Encounter (HOSPITAL_COMMUNITY): Payer: Self-pay | Admitting: Psychiatry

## 2017-06-15 ENCOUNTER — Ambulatory Visit (INDEPENDENT_AMBULATORY_CARE_PROVIDER_SITE_OTHER): Payer: BLUE CROSS/BLUE SHIELD | Admitting: Psychiatry

## 2017-06-15 VITALS — BP 130/82 | HR 81 | Ht 65.0 in | Wt 173.0 lb

## 2017-06-15 DIAGNOSIS — F4310 Post-traumatic stress disorder, unspecified: Secondary | ICD-10-CM | POA: Diagnosis not present

## 2017-06-15 DIAGNOSIS — Z811 Family history of alcohol abuse and dependence: Secondary | ICD-10-CM | POA: Diagnosis not present

## 2017-06-15 DIAGNOSIS — Z91411 Personal history of adult psychological abuse: Secondary | ICD-10-CM | POA: Diagnosis not present

## 2017-06-15 DIAGNOSIS — F17210 Nicotine dependence, cigarettes, uncomplicated: Secondary | ICD-10-CM

## 2017-06-15 DIAGNOSIS — Z62810 Personal history of physical and sexual abuse in childhood: Secondary | ICD-10-CM | POA: Diagnosis not present

## 2017-06-15 DIAGNOSIS — F319 Bipolar disorder, unspecified: Secondary | ICD-10-CM

## 2017-06-15 DIAGNOSIS — F419 Anxiety disorder, unspecified: Secondary | ICD-10-CM | POA: Diagnosis not present

## 2017-06-15 MED ORDER — TRAZODONE HCL 50 MG PO TABS: 50.0000 mg | ORAL_TABLET | Freq: Every day | ORAL | 0 refills | Status: DC

## 2017-06-15 MED ORDER — LAMOTRIGINE 25 MG PO TABS: ORAL_TABLET | ORAL | 0 refills | Status: DC
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
## 2017-07-14 ENCOUNTER — Ambulatory Visit: Payer: BLUE CROSS/BLUE SHIELD | Admitting: Sports Medicine

## 2017-07-16 ENCOUNTER — Ambulatory Visit (INDEPENDENT_AMBULATORY_CARE_PROVIDER_SITE_OTHER): Payer: BLUE CROSS/BLUE SHIELD | Admitting: Psychiatry

## 2017-07-16 ENCOUNTER — Encounter (HOSPITAL_COMMUNITY): Payer: Self-pay | Admitting: Psychiatry

## 2017-07-16 DIAGNOSIS — Z560 Unemployment, unspecified: Secondary | ICD-10-CM | POA: Diagnosis not present

## 2017-07-16 DIAGNOSIS — E039 Hypothyroidism, unspecified: Secondary | ICD-10-CM | POA: Diagnosis not present

## 2017-07-16 DIAGNOSIS — F411 Generalized anxiety disorder: Secondary | ICD-10-CM | POA: Diagnosis not present

## 2017-07-16 DIAGNOSIS — M25562 Pain in left knee: Secondary | ICD-10-CM

## 2017-07-16 DIAGNOSIS — M25532 Pain in left wrist: Secondary | ICD-10-CM | POA: Diagnosis not present

## 2017-07-16 DIAGNOSIS — F319 Bipolar disorder, unspecified: Secondary | ICD-10-CM

## 2017-07-16 DIAGNOSIS — Z811 Family history of alcohol abuse and dependence: Secondary | ICD-10-CM | POA: Diagnosis not present

## 2017-07-16 DIAGNOSIS — M549 Dorsalgia, unspecified: Secondary | ICD-10-CM | POA: Diagnosis not present

## 2017-07-16 DIAGNOSIS — F17210 Nicotine dependence, cigarettes, uncomplicated: Secondary | ICD-10-CM

## 2017-07-16 DIAGNOSIS — Z62810 Personal history of physical and sexual abuse in childhood: Secondary | ICD-10-CM | POA: Diagnosis not present

## 2017-07-16 MED ORDER — LAMOTRIGINE 100 MG PO TABS: 100.0000 mg | ORAL_TABLET | Freq: Every day | ORAL | 1 refills | Status: DC
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
## 2017-07-19 ENCOUNTER — Ambulatory Visit: Payer: BLUE CROSS/BLUE SHIELD | Admitting: Sports Medicine

## 2017-07-19 ENCOUNTER — Encounter: Payer: Self-pay | Admitting: Sports Medicine

## 2017-07-19 VITALS — BP 116/78 | HR 95 | Ht 65.0 in | Wt 180.6 lb

## 2017-07-19 DIAGNOSIS — M4726 Other spondylosis with radiculopathy, lumbar region: Secondary | ICD-10-CM | POA: Diagnosis not present

## 2017-07-19 DIAGNOSIS — M79645 Pain in left finger(s): Secondary | ICD-10-CM | POA: Diagnosis not present

## 2017-07-19 DIAGNOSIS — M9907 Segmental and somatic dysfunction of upper extremity: Secondary | ICD-10-CM | POA: Diagnosis not present

## 2017-07-19 DIAGNOSIS — M2242 Chondromalacia patellae, left knee: Secondary | ICD-10-CM | POA: Diagnosis not present

## 2017-07-19 DIAGNOSIS — M9906 Segmental and somatic dysfunction of lower extremity: Secondary | ICD-10-CM

## 2017-07-19 DIAGNOSIS — M19049 Primary osteoarthritis, unspecified hand: Secondary | ICD-10-CM | POA: Diagnosis not present

## 2017-07-19 DIAGNOSIS — M2550 Pain in unspecified joint: Secondary | ICD-10-CM

## 2017-07-19 DIAGNOSIS — R2689 Other abnormalities of gait and mobility: Secondary | ICD-10-CM

## 2017-07-19 NOTE — Progress Notes (Signed)
[name]. [place], [name] at [facility] [phone]  [name] - 57 y.o. 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 She was last seen in the office 12/15/2016 and was advised to continue using topical treatment as well as icing and strengthening. It was recommended that she wear DonJoy Tru Pull brace. Last MRI of the LT knee was 02/24/2014. She had knee inj 09/18/2016.  Pt reports stabbing sensation in the LT knee that seems to be getting worse. Her knee has given out on her several times over the past few weeks. She has been compliant with wearing knee brace. When she does yard work she will also use a compression sleeve or ACE wrap. She is unable to walk long distances without increased knee pain.    02/25/2018: Compared to the last office visit, her previously described symptoms are worsening. She has fallen twice since her last visit d/t her knee giving out.  Current symptoms are moderate & are nonradiating She has been wearing her knee brace while being active.  MRI review IMPRESSION: 1. No meniscal or ligamentous injury of the left knee. 2. Partial-thickness cartilage loss of the medial patellofemoral Compartment.  She continues to have weakness, swelling, and pain in the LT thumb. Celebrex is helping with her thumb and knee pain.   04/21/2017: Compared to the last office visit on 02/25/17, her previously described L knee and L thumb symptoms show no change  Current symptoms are mod - severe & are nonradiating She has been doing her HEP per her visit w/ [name] but states that she "threw her back out" last week  when she was trying to do the exercises at home.  07/19/2017: Compared to the last office visit, her previously described symptoms of L knee pain show no change. She has more pain when wearing her knee brace.  Current symptoms are moderate & are nonradiating She has been wearing her knee brace when being active. She has been applying topical diclofenac with some relief. She has tried going for a walk but she hasn't been able to walk very far.   Compared to the last office visit, her previously described symptoms show no change. She does feel like the swelling has gone down slightly.  Current symptoms are mild & are non-radiating. Pain improves when wearing the brace. She cannot grip even when she isn't wearing her brace.  She has been wearing her Exos brace daily. She feels like there is too much pressure around her thumb when she wears it at night. She has tried taking Celebrex but it made her hands and fingers swell.   ROS Reports night time disturbances. Denies fevers, chills, or night sweats. Denies unexplained weight loss. Denies personal history of cancer. Denies changes in bowel or bladder habits. Reports recent unreported falls x 1. Denies new or worsening dyspnea or wheezing. Denies headaches or dizziness.  Reports numbness, tingling or weakness in L hand.  Denies dizziness or presyncopal episodes Denies lower extremity edema    HISTORY & PERTINENT PRIOR DATA:  Prior History reviewed and updated per electronic medical record.  Significant/pertinent history,  findings, studies include:  reports that she has been smoking.  She has a 16.50 pack-year smoking history. She has never used smokeless tobacco. Recent Labs    01/18/17 1519 02/09/17 1440  HGBA1C 5.2  --   LABURIC  --  2.7   No specialty comments available. No problems updated.  OBJECTIVE:  VS:  HT:5\' 5"  (165.1 cm)   WT:180 lb 9.6 oz (81.9 kg)  BMI:30.05    BP:116/78  HR:95bpm  TEMP: ( )  RESP:97 %     PHYSICAL EXAM: Constitutional: WDWN, Non-toxic appearing. Psychiatric: Alert & appropriately interactive.  Not depressed or anxious appearing. Respiratory: No increased work of breathing.  Trachea Midline Eyes: Pupils are equal.  EOM intact without nystagmus.  No scleral icterus  Vascular Exam: warm to touch no edema  upper and lower extremity neuro exam: unremarkable normal strength normal sensation  MSK Exam: Ultimately her thumb joints are slightly arthritic and subluxed but she responds well to osteopathic manipulation today.  Grip strength is intact.  Mild tenderness.  She is not interested in injection today.  Negative Finkelstein testing.  Her bilateral knees have moderate degree of tibial extorsion at baseline but there is a functional discrepancy here as well.  She is good flexion-extension with generalized weakness.   ASSESSMENT & PLAN:   1. Somatic dysfunction of lower extremity   2. Somatic dysfunction of upper extremity     PLAN: Ultimately today reassurance and osteopathic manipulation were provided.  We will plan to have her continue with home therapeutic exercises and begin trying to increase her mobility as much as possible.  At this point there is no focal rheumatologic findings and at this point seems more likely to be related to nonspecific polyarthralgia complaints such as fibromyalgia or myofascial pain syndrome.  Follow-up in 8 weeks and consider repeat manipulation at that time.  Follow-up: Return in about 8 weeks (around 09/13/2017).      Please see additional documentation for Objective, Assessment and Plan sections. Pertinent additional documentation may be included in corresponding procedure notes, imaging studies, problem based documentation and patient instructions. Please see these sections of the encounter for additional information regarding this visit.  CMA/ATC served as Education administrator during this visit. History, Physical, and Plan performed by medical  provider. Documentation and orders reviewed and attested to.      [name], [place] [facility] Physician

## 2017-07-19 NOTE — Progress Notes (Signed)
PROCEDURE NOTE : OSTEOPATHIC MANIPULATION The decision today to treat with Osteopathic Manipulative Therapy (OMT) was based on physical exam findings. Verbal consent was obtained following a discussion with the patient regarding the of risks, benefits and potential side effects, including an acute pain flare,post manipulation soreness and need for repeat treatments.     NONE  Manipulation was performed as below: Regions treated: Upper extremities and Lower extremities OMT Techniques Used: HVLA, muscle energy and myofascial release  The patient tolerated the treatment well and reported Improved symptoms following treatment today. Patient was given medications, exercises, stretches and lifestyle modifications per AVS and verbally.   OSTEOPATHIC/STRUCTURAL EXAM:   Tibial extortion cmc subluxulation

## 2017-07-30 ENCOUNTER — Encounter: Payer: Self-pay | Admitting: Sports Medicine

## 2017-09-13 ENCOUNTER — Ambulatory Visit: Payer: BLUE CROSS/BLUE SHIELD | Admitting: Sports Medicine

## 2017-09-16 ENCOUNTER — Ambulatory Visit: Payer: Self-pay

## 2017-09-16 ENCOUNTER — Encounter (HOSPITAL_COMMUNITY): Payer: Self-pay | Admitting: Psychiatry

## 2017-09-16 ENCOUNTER — Ambulatory Visit: Payer: BLUE CROSS/BLUE SHIELD | Admitting: Sports Medicine

## 2017-09-16 ENCOUNTER — Encounter: Payer: Self-pay | Admitting: Sports Medicine

## 2017-09-16 ENCOUNTER — Ambulatory Visit (INDEPENDENT_AMBULATORY_CARE_PROVIDER_SITE_OTHER): Payer: BLUE CROSS/BLUE SHIELD | Admitting: Psychiatry

## 2017-09-16 VITALS — BP 110/75 | HR 85

## 2017-09-16 VITALS — BP 110/70 | HR 85 | Ht 65.0 in

## 2017-09-16 DIAGNOSIS — F17200 Nicotine dependence, unspecified, uncomplicated: Secondary | ICD-10-CM | POA: Diagnosis not present

## 2017-09-16 DIAGNOSIS — M79645 Pain in left finger(s): Secondary | ICD-10-CM

## 2017-09-16 DIAGNOSIS — M2550 Pain in unspecified joint: Secondary | ICD-10-CM | POA: Diagnosis not present

## 2017-09-16 DIAGNOSIS — F319 Bipolar disorder, unspecified: Secondary | ICD-10-CM | POA: Diagnosis not present

## 2017-09-16 DIAGNOSIS — F4310 Post-traumatic stress disorder, unspecified: Secondary | ICD-10-CM

## 2017-09-16 DIAGNOSIS — M2242 Chondromalacia patellae, left knee: Secondary | ICD-10-CM

## 2017-09-16 DIAGNOSIS — F17210 Nicotine dependence, cigarettes, uncomplicated: Secondary | ICD-10-CM

## 2017-09-16 DIAGNOSIS — Z811 Family history of alcohol abuse and dependence: Secondary | ICD-10-CM | POA: Diagnosis not present

## 2017-09-16 DIAGNOSIS — M19049 Primary osteoarthritis, unspecified hand: Secondary | ICD-10-CM

## 2017-09-16 MED ORDER — LAMOTRIGINE 150 MG PO TABS: 150.0000 mg | ORAL_TABLET | Freq: Every day | ORAL | 1 refills | Status: DC

## 2017-09-16 MED ORDER — ARIPIPRAZOLE 5 MG PO TABS: 5.0000 mg | ORAL_TABLET | Freq: Every day | ORAL | 1 refills | Status: DC

## 2017-09-16 MED ORDER — DICLOFENAC SODIUM 1 % TD GEL: TRANSDERMAL | 3 refills | Status: DC

## 2017-09-16 NOTE — Progress Notes (Signed)
[name] Neyla Gauntt, [place] [facility] [place]  DALYA MASELLI - 57 y.o. female MRN [id number]  Date of birth: 08-24-1960  Visit Date: 09/16/2017  PCP: Hoyt Koch, MD   Referred by: Hoyt Koch, *  Scribe(s) for today's visit: Wendy Poet, LAT, ATC  SUBJECTIVE:  [name] "[name]" is here for Follow-up (L knee and L thumb pain) .    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 She was last seen in the office 12/15/2016 and was advised to continue using topical treatment as well as icing and strengthening. It was recommended that she wear DonJoy Tru Pull brace. Last MRI of the LT knee was 02/24/2014. She had knee inj 09/18/2016.  Pt reports stabbing sensation in the LT knee that seems to be getting worse. Her knee has given out on her several times over the past few weeks. She has been compliant with wearing knee brace. When she does yard work she will also use a compression sleeve or ACE wrap. She is unable to walk long distances without increased knee pain.    02/25/2018: Compared to the last office visit, her previously described symptoms are worsening. She has fallen twice since her last visit d/t her knee giving out.  Current symptoms are moderate & are nonradiating She has been wearing her knee brace while being active.  MRI review IMPRESSION: 1. No meniscal or ligamentous injury of the left knee. 2. Partial-thickness cartilage loss of the medial patellofemoral Compartment.  She continues to have weakness, swelling, and pain in the LT thumb. Celebrex is helping with her thumb and knee pain.   04/21/2017: Compared to the last office visit on 02/25/17, her previously described L knee and L thumb symptoms show no change  Current symptoms are mod - severe & are nonradiating She has been doing her HEP per her visit w/ [name] but states that she "threw her back out"  last week when she was trying to do the exercises at home.  07/19/2017: Compared to the last office visit, her previously described symptoms of L knee pain show no change. She has more pain when wearing her knee brace.  Current symptoms are moderate & are nonradiating She has been wearing her knee brace when being active. She has been applying topical diclofenac with some relief. She has tried going for a walk but she hasn't been able to walk very far.   Compared to the last office visit, her previously described symptoms show no change. She does feel like the swelling has gone down slightly.  Current symptoms are mild & are non-radiating. Pain improves when wearing the brace. She cannot grip even when she isn't wearing her brace.  She has been wearing her Exos brace daily. She feels like there is too much pressure around her thumb when she wears it at night. She has tried taking Celebrex but it made her hands and fingers swell.   09/16/17: Compared to the last office visit on 07/19/17, her previously described L knee and L thumb pain symptoms show no change - slightly worse.  She states that she felt a pop in her L knee about a month ago when she was making her bed.   Current symptoms are moderate & are radiating to the medial knee/lower leg. She notes that the L thumb pain is severe at times. She has been wearing both her knee brace and her  Exos splint for her thumb when she is doing increased activity.  She has been using diclofenac.   REVIEW OF SYSTEMS: Reports night time disturbances. Denies fevers, chills, or night sweats. Denies unexplained weight loss. Denies personal history of cancer. Denies changes in bowel or bladder habits. Reports recent unreported falls.  Slipped and fell on her deck. Denies new or worsening dyspnea or wheezing. Reports headaches or dizziness. Some dizziness w/ neck change of direction especially when going from looking up to down. Reports numbness, tingling or  weakness  In the extremities - in the L hand. Denies dizziness or presyncopal episodes Denies lower extremity edema    HISTORY & PERTINENT PRIOR DATA:  Prior History reviewed and updated per electronic medical record.  Significant/pertinent history, findings, studies include:  reports that she has been smoking.  She has a 16.50 pack-year smoking history. She has never used smokeless tobacco. Recent Labs    01/18/17 1519 02/09/17 1440  HGBA1C 5.2  --   LABURIC  --  2.7   No specialty comments available. No problems updated.  OBJECTIVE:  VS:  HT:5\' 5"  (165.1 cm)   WT:   BMI:     BP:110/70  HR:85bpm  TEMP: ( )  RESP:97 %   PHYSICAL EXAM: Constitutional: WDWN, Non-toxic appearing. Psychiatric: Slightly anxious with pressured speech Alert & appropriately interactive.  Respiratory: No increased work of breathing.  Trachea Midline Eyes: Pupils are equal.  EOM intact without nystagmus.  No scleral icterus  Vascular Exam: warm to touch no edema  upper and lower extremity neuro exam: unremarkable normal strength normal sensation  MSK Exam: Bilateral hands overall slightly arthritic appearing with bossing of the CMC joints.  She has pain with axial load and circumduction.  Negative Finkelstein testing.  Grip strength is intact.  Her left knee is overall well aligned without significant deformity.  She has a small amount of pain with axial load and flexion/extension/patellar grind testing.  There is no significant crepitation.  She is ligamentously stable.  Negative McMurray's.   ASSESSMENT & PLAN:   1. Pain of left thumb   2. Chondromalacia, patella, left   3. Polyarthralgia   4. Tobacco use disorder   5. CMC arthritis     PLAN: Once again encouraged continued physical therapy and continued activity with appropriate bracing for her knee.  She does have underlying degenerative changes of the thumb as well and this was discussed in detail with her.  Topical Voltaren  gel prescription provided today and she uses on both the thumbs and knees.  I am happy to see her back sooner but otherwise we will follow-up as scheduled in 6 months for refills.  Follow-up: Return in about 6 months (around 03/18/2018).      Please see additional documentation for Objective, Assessment and Plan sections. Pertinent additional documentation may be included in corresponding procedure notes, imaging studies, problem based documentation and patient instructions. Please see these sections of the encounter for additional information regarding this visit.  CMA/ATC served as Education administrator during this visit. History, Physical, and Plan performed by medical provider. Documentation and orders reviewed and attested to.      [name], [place] [facility] Physician
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
## 2017-09-16 NOTE — Procedures (Signed)
LIMITED MSK ULTRASOUND OF Left Thumb Images were obtained and interpreted by myself, Teresa Coombs, DO  Images have been saved and stored to PACS system. Images obtained on: GE S7 Ultrasound machine  FINDINGS:   1st CMC and MCP joint with spurring and slight joint effusion.  no significant cortical disruption. moderate synovitis   IMPRESSION:  1. 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

## 2017-09-20 ENCOUNTER — Encounter: Payer: Self-pay | Admitting: Sports Medicine

## 2017-09-21 ENCOUNTER — Telehealth: Payer: Self-pay | Admitting: Internal Medicine

## 2017-09-21 MED ORDER — EPINEPHRINE 0.3 MG/0.3ML IJ SOAJ
0.3000 mg | Freq: Once | INTRAMUSCULAR | 1 refills | Status: AC
Start: 2017-09-21 — End: 2017-09-21

## 2017-09-21 NOTE — Telephone Encounter (Signed)
Copied from [place] [id number]. Topic: General - Other >> Sep 21, 2017 11:57 AM Oneta Rack wrote: Relation to pt: self  Call back number: [phone] Pharmacy: [id number] - [place], [place]. [address] [phone] (Phone) [phone] (Fax)   Reason for call:  Patient was advised by pharmacy that children strength  EPINEPHrine 0.15 MG/0.15ML IJ injection was prescribed instead of adult strength, patient requesting a refill, please advise

## 2017-09-21 NOTE — Telephone Encounter (Signed)
Sent in epi-pen

## 2017-10-28 ENCOUNTER — Ambulatory Visit (HOSPITAL_COMMUNITY): Payer: Self-pay | Admitting: Psychiatry

## 2017-11-09 ENCOUNTER — Telehealth: Payer: Self-pay | Admitting: Internal Medicine

## 2017-11-09 NOTE — Telephone Encounter (Signed)
LVM informing patient of MD response  

## 2017-11-09 NOTE — Telephone Encounter (Signed)
Pt requesting Dr. Sharlet Salina to call her to discuss the colonoscopy. She states she has not had one in 73 years and is nervous because a friend of hers was diagnosed with cancer recently. Please advise.

## 2017-11-09 NOTE — Telephone Encounter (Signed)
Copied from [place] [id number]. Topic: Referral - Request >> Nov 09, 2017  1:46 PM Yvette Rack wrote: Reason for CRM: pt calling wanting a referral to have a colonoscopy she would like for it to be done at [facility] on the 3rd floor

## 2017-11-09 NOTE — Telephone Encounter (Signed)
Not due until 2020. We can discuss at next visit.

## 2017-11-09 NOTE — Telephone Encounter (Signed)
LOV was CPE in 12/2016

## 2017-11-10 NOTE — Telephone Encounter (Signed)
LVM for patient to call back and let us know if she know/remebers where her last colonoscopy was we will need her to come in and sign a release record

## 2017-11-10 NOTE — Telephone Encounter (Signed)
Patient states that she did not have a colonoscopy in 2010 that was guesstimating and once she sat and though about it it has been more than 14 years. Patients is just scared and worried that her friend had a colonoscopy recently and was diagnosed with colon cancer. Patient is really wanting to be referred to GI for the colonoscopy.

## 2017-11-10 NOTE — Telephone Encounter (Signed)
Faxed form to get colonoscopy results.

## 2017-11-10 NOTE — Telephone Encounter (Signed)
Where did she has last colonoscopy? We need to get the records for the exact date and the GI specialist will want to see this before they do a colonoscopy on her. She needs to come sign for records from that place.

## 2017-11-10 NOTE — Telephone Encounter (Signed)
Pt Called back in stated her last Colonoscopy was at [facility] and she was thinking it was around 2006.  That is the last one she remembers.    Best number  [phone]

## 2017-12-28 ENCOUNTER — Ambulatory Visit (HOSPITAL_COMMUNITY): Payer: Self-pay | Admitting: Psychiatry

## 2018-01-13 ENCOUNTER — Ambulatory Visit (HOSPITAL_COMMUNITY): Payer: BLUE CROSS/BLUE SHIELD | Admitting: Psychiatry

## 2018-01-20 ENCOUNTER — Encounter: Payer: Self-pay | Admitting: Internal Medicine

## 2018-01-25 ENCOUNTER — Encounter: Payer: Self-pay | Admitting: Internal Medicine

## 2018-01-25 ENCOUNTER — Ambulatory Visit (INDEPENDENT_AMBULATORY_CARE_PROVIDER_SITE_OTHER): Payer: BLUE CROSS/BLUE SHIELD | Admitting: Internal Medicine

## 2018-01-25 ENCOUNTER — Other Ambulatory Visit (INDEPENDENT_AMBULATORY_CARE_PROVIDER_SITE_OTHER): Payer: BLUE CROSS/BLUE SHIELD

## 2018-01-25 VITALS — BP 150/90 | HR 88 | Temp 98.5°F | Ht 65.0 in | Wt 208.0 lb

## 2018-01-25 DIAGNOSIS — E039 Hypothyroidism, unspecified: Secondary | ICD-10-CM

## 2018-01-25 DIAGNOSIS — Z0000 Encounter for general adult medical examination without abnormal findings: Secondary | ICD-10-CM

## 2018-01-25 DIAGNOSIS — Z23 Encounter for immunization: Secondary | ICD-10-CM

## 2018-01-25 DIAGNOSIS — F17200 Nicotine dependence, unspecified, uncomplicated: Secondary | ICD-10-CM

## 2018-01-25 LAB — HEMOGLOBIN A1C: Hgb A1c MFr Bld: 5.4 % (ref 4.6–6.5)

## 2018-01-25 LAB — TSH: TSH: 3.69 u[IU]/mL (ref 0.35–4.50)

## 2018-01-25 LAB — COMPREHENSIVE METABOLIC PANEL
ALBUMIN: 3.9 g/dL (ref 3.5–5.2)
ALK PHOS: 101 U/L (ref 39–117)
ALT: 12 U/L (ref 0–35)
AST: 14 U/L (ref 0–37)
BILIRUBIN TOTAL: 0.3 mg/dL (ref 0.2–1.2)
BUN: 12 mg/dL (ref 6–23)
CALCIUM: 9.4 mg/dL (ref 8.4–10.5)
CO2: 24 mEq/L (ref 19–32)
Chloride: 107 mEq/L (ref 96–112)
Creatinine, Ser: 0.75 mg/dL (ref 0.40–1.20)
GFR: 84.06 mL/min (ref >60.00)
GLUCOSE: 96 mg/dL (ref 70–99)
POTASSIUM: 3.6 meq/L (ref 3.5–5.1)
Sodium: 139 mEq/L (ref 135–145)
Total Protein: 6.7 g/dL (ref 6.0–8.3)

## 2018-01-25 LAB — VITAMIN B12: VITAMIN B 12: 520 pg/mL (ref 211–911)

## 2018-01-25 LAB — CBC
HEMATOCRIT: 38.3 % (ref 36.0–46.0)
Hemoglobin: 13 g/dL (ref 12.0–15.0)
MCHC: 33.9 g/dL (ref 30.0–36.0)
MCV: 88 fl (ref 78.0–100.0)
PLATELETS: 313 10*3/uL (ref 150.0–400.0)
RBC: 4.35 Mil/uL (ref 3.87–5.11)
RDW: 13.9 % (ref 11.5–15.5)
WBC: 8.7 10*3/uL (ref 4.0–10.5)

## 2018-01-25 LAB — LDL CHOLESTEROL, DIRECT: Direct LDL: 138 mg/dL

## 2018-01-25 LAB — LIPID PANEL
CHOLESTEROL: 225 mg/dL — AB (ref 0–200)
HDL: 39.3 mg/dL (ref >39.00)
NonHDL: 185.62
Total CHOL/HDL Ratio: 6
Triglycerides: 381 mg/dL — ABNORMAL HIGH (ref 0.0–149.0)
VLDL: 76.2 mg/dL — AB (ref 0.0–40.0)

## 2018-01-25 LAB — T4, FREE: Free T4: 0.59 ng/dL — ABNORMAL LOW (ref 0.60–1.60)

## 2018-01-25 NOTE — Progress Notes (Signed)
   Subjective:    Patient ID: [name], female    DOB: 08-02-1960, 57 y.o.   MRN: [id number]  HPI The patient is a 57 YO female coming in for physical. Stopped taking all mental health medications as she did not think she needed them. Is currently cut off her daughter and they are not talking.   PMH, [employer] social history reviewed and updated.   Review of Systems  Constitutional: Negative.   HENT: Negative.   Eyes: Negative.   Respiratory: Negative for cough, chest tightness and shortness of breath.   Cardiovascular: Negative for chest pain, palpitations and leg swelling.  Gastrointestinal: Negative for abdominal distention, abdominal pain, constipation, diarrhea, nausea and vomiting.  Musculoskeletal: Negative.   Skin: Negative.   Neurological: Negative.   Psychiatric/Behavioral: Negative.       Objective:   Physical Exam  Constitutional: She is oriented to person, place, and time. She appears well-developed and well-nourished.  HENT:  Head: Normocephalic and atraumatic.  Eyes: EOM are normal.  Neck: Normal range of motion.  Cardiovascular: Normal rate and regular rhythm.  Pulmonary/Chest: Effort normal and breath sounds normal. No respiratory distress. She has no wheezes. She has no rales.  Abdominal: Soft. Bowel sounds are normal. She exhibits no distension. There is no tenderness. There is no rebound.  Musculoskeletal: She exhibits no edema.  Neurological: She is alert and oriented to person, place, and time. Coordination normal.  Skin: Skin is warm and dry.   Vitals:   01/25/18 1410  BP: (!) 150/90  Pulse: 88  Temp: 98.5 F (36.9 C)  TempSrc: Oral  SpO2: 97%  Weight: 208 lb (94.3 kg)  Height: 5\' 5"  (1.651 m)      Assessment & Plan:  Flu shot given at visit

## 2018-01-25 NOTE — Patient Instructions (Addendum)
We will get the colonoscopy done as soon as possible.   We have sent in the weight loss medicine.   Health Maintenance, Female Adopting a healthy lifestyle and getting preventive care can go a long way to promote health and wellness. Talk with your health care provider about what schedule of regular examinations is right for you. This is a good chance for you to check in with your provider about disease prevention and staying healthy. In between checkups, there are plenty of things you can do on your own. Experts have done a lot of research about which lifestyle changes and preventive measures are most likely to keep you healthy. Ask your health care provider for more information. Weight and diet Eat a healthy diet  Be sure to include plenty of vegetables, fruits, low-fat dairy products, and lean protein.  Do not eat a lot of foods high in solid fats, added sugars, or salt.  Get regular exercise. This is one of the most important things you can do for your health. ? Most adults should exercise for at least 150 minutes each week. The exercise should increase your heart rate and make you sweat (moderate-intensity exercise). ? Most adults should also do strengthening exercises at least twice a week. This is in addition to the moderate-intensity exercise.  Maintain a healthy weight  Body mass index (BMI) is a measurement that can be used to identify possible weight problems. It estimates body fat based on height and weight. Your health care provider can help determine your BMI and help you achieve or maintain a healthy weight.  For females 59 years of age and older: ? A BMI below 18.5 is considered underweight. ? A BMI of 18.5 to 24.9 is normal. ? A BMI of 25 to 29.9 is considered overweight. ? A BMI of 30 and above is considered obese.  Watch levels of cholesterol and blood lipids  You should start having your blood tested for lipids and cholesterol at 59 years of age, then have this test  every 5 years.  You may need to have your cholesterol levels checked more often if: ? Your lipid or cholesterol levels are high. ? You are older than 59 years of age. ? You are at high risk for heart disease.  Cancer screening Lung Cancer  Lung cancer screening is recommended for adults 41-59 years old who are at high risk for lung cancer because of a history of smoking.  A yearly low-dose CT scan of the lungs is recommended for people who: ? Currently smoke. ? Have quit within the past 15 years. ? Have at least a 30-pack-year history of smoking. A pack year is smoking an average of one pack of cigarettes a day for 1 year.  Yearly screening should continue until it has been 15 years since you quit.  Yearly screening should stop if you develop a health problem that would prevent you from having lung cancer treatment.  Breast Cancer  Practice breast self-awareness. This means understanding how your breasts normally appear and feel.  It also means doing regular breast self-exams. Let your health care provider know about any changes, no matter how small.  If you are in your 20s or 30s, you should have a clinical breast exam (CBE) by a health care provider every 1-3 years as part of a regular health exam.  If you are 25 or older, have a CBE every year. Also consider having a breast X-ray (mammogram) every year.  If you have  a family history of breast cancer, talk to your health care provider about genetic screening.  If you are at high risk for breast cancer, talk to your health care provider about having an MRI and a mammogram every year.  Breast cancer gene (BRCA) assessment is recommended for women who have family members with BRCA-related cancers. BRCA-related cancers include: ? Breast. ? Ovarian. ? Tubal. ? Peritoneal cancers.  Results of the assessment will determine the need for genetic counseling and BRCA1 and BRCA2 testing.  Cervical Cancer Your health care provider  may recommend that you be screened regularly for cancer of the pelvic organs (ovaries, uterus, and vagina). This screening involves a pelvic examination, including checking for microscopic changes to the surface of your cervix (Pap test). You may be encouraged to have this screening done every 3 years, beginning at age 21.  For women ages 30-65, health care providers may recommend pelvic exams and Pap testing every 3 years, or they may recommend the Pap and pelvic exam, combined with testing for human papilloma virus (HPV), every 5 years. Some types of HPV increase your risk of cervical cancer. Testing for HPV may also be done on women of any age with unclear Pap test results.  Other health care providers may not recommend any screening for nonpregnant women who are considered low risk for pelvic cancer and who do not have symptoms. Ask your health care provider if a screening pelvic exam is right for you.  If you have had past treatment for cervical cancer or a condition that could lead to cancer, you need Pap tests and screening for cancer for at least 20 years after your treatment. If Pap tests have been discontinued, your risk factors (such as having a new sexual partner) need to be reassessed to determine if screening should resume. Some women have medical problems that increase the chance of getting cervical cancer. In these cases, your health care provider may recommend more frequent screening and Pap tests.  Colorectal Cancer  This type of cancer can be detected and often prevented.  Routine colorectal cancer screening usually begins at 59 years of age and continues through 59 years of age.  Your health care provider may recommend screening at an earlier age if you have risk factors for colon cancer.  Your health care provider may also recommend using home test kits to check for hidden blood in the stool.  A small camera at the end of a tube can be used to examine your colon directly  (sigmoidoscopy or colonoscopy). This is done to check for the earliest forms of colorectal cancer.  Routine screening usually begins at age 50.  Direct examination of the colon should be repeated every 5-10 years through 59 years of age. However, you may need to be screened more often if early forms of precancerous polyps or small growths are found.  Skin Cancer  Check your skin from head to toe regularly.  Tell your health care provider about any new moles or changes in moles, especially if there is a change in a mole's shape or color.  Also tell your health care provider if you have a mole that is larger than the size of a pencil eraser.  Always use sunscreen. Apply sunscreen liberally and repeatedly throughout the day.  Protect yourself by wearing long sleeves, pants, a wide-brimmed hat, and sunglasses whenever you are outside.  Heart disease, diabetes, and high blood pressure  High blood pressure causes heart disease and increases the risk   of stroke. High blood pressure is more likely to develop in: ? People who have blood pressure in the high end of the normal range (130-139/85-89 mm Hg). ? People who are overweight or obese. ? People who are African American.  If you are 18-39 years of age, have your blood pressure checked every 3-5 years. If you are 40 years of age or older, have your blood pressure checked every year. You should have your blood pressure measured twice-once when you are at a hospital or clinic, and once when you are not at a hospital or clinic. Record the average of the two measurements. To check your blood pressure when you are not at a hospital or clinic, you can use: ? An automated blood pressure machine at a pharmacy. ? A home blood pressure monitor.  If you are between 55 years and 79 years old, ask your health care provider if you should take aspirin to prevent strokes.  Have regular diabetes screenings. This involves taking a blood sample to check your  fasting blood sugar level. ? If you are at a normal weight and have a low risk for diabetes, have this test once every three years after 59 years of age. ? If you are overweight and have a high risk for diabetes, consider being tested at a younger age or more often. Preventing infection Hepatitis B  If you have a higher risk for hepatitis B, you should be screened for this virus. You are considered at high risk for hepatitis B if: ? You were born in a country where hepatitis B is common. Ask your health care provider which countries are considered high risk. ? Your parents were born in a high-risk country, and you have not been immunized against hepatitis B (hepatitis B vaccine). ? You have HIV or AIDS. ? You use needles to inject street drugs. ? You live with someone who has hepatitis B. ? You have had sex with someone who has hepatitis B. ? You get hemodialysis treatment. ? You take certain medicines for conditions, including cancer, organ transplantation, and autoimmune conditions.  Hepatitis C  Blood testing is recommended for: ? Everyone born from 1945 through 1965. ? Anyone with known risk factors for hepatitis C.  Sexually transmitted infections (STIs)  You should be screened for sexually transmitted infections (STIs) including gonorrhea and chlamydia if: ? You are sexually active and are younger than 59 years of age. ? You are older than 59 years of age and your health care provider tells you that you are at risk for this type of infection. ? Your sexual activity has changed since you were last screened and you are at an increased risk for chlamydia or gonorrhea. Ask your health care provider if you are at risk.  If you do not have HIV, but are at risk, it may be recommended that you take a prescription medicine daily to prevent HIV infection. This is called pre-exposure prophylaxis (PrEP). You are considered at risk if: ? You are sexually active and do not regularly use condoms  or know the HIV status of your partner(s). ? You take drugs by injection. ? You are sexually active with a partner who has HIV.  Talk with your health care provider about whether you are at high risk of being infected with HIV. If you choose to begin PrEP, you should first be tested for HIV. You should then be tested every 3 months for as long as you are taking PrEP. Pregnancy    If you are premenopausal and you may become pregnant, ask your health care provider about preconception counseling.  If you may become pregnant, take 400 to 800 micrograms (mcg) of folic acid every day.  If you want to prevent pregnancy, talk to your health care provider about birth control (contraception). Osteoporosis and menopause  Osteoporosis is a disease in which the bones lose minerals and strength with aging. This can result in serious bone fractures. Your risk for osteoporosis can be identified using a bone density scan.  If you are 1 years of age or older, or if you are at risk for osteoporosis and fractures, ask your health care provider if you should be screened.  Ask your health care provider whether you should take a calcium or vitamin D supplement to lower your risk for osteoporosis.  Menopause may have certain physical symptoms and risks.  Hormone replacement therapy may reduce some of these symptoms and risks. Talk to your health care provider about whether hormone replacement therapy is right for you. Follow these instructions at home:  Schedule regular health, dental, and eye exams.  Stay current with your immunizations.  Do not use any tobacco products including cigarettes, chewing tobacco, or electronic cigarettes.  If you are pregnant, do not drink alcohol.  If you are breastfeeding, limit how much and how often you drink alcohol.  Limit alcohol intake to no more than 1 drink per day for nonpregnant women. One drink equals 12 ounces of beer, 5 ounces of wine, or 1 ounces of hard  liquor.  Do not use street drugs.  Do not share needles.  Ask your health care provider for help if you need support or information about quitting drugs.  Tell your health care provider if you often feel depressed.  Tell your health care provider if you have ever been abused or do not feel safe at home. This information is not intended to replace advice given to you by your health care provider. Make sure you discuss any questions you have with your health care provider. Document Released: 09/22/2010 Document Revised: 08/15/2015 Document Reviewed: 12/11/2014 Elsevier Interactive Patient Education  [employer].

## 2018-01-26 ENCOUNTER — Encounter: Payer: Self-pay | Admitting: Gastroenterology

## 2018-01-26 NOTE — Assessment & Plan Note (Signed)
Time spent counseling about tobacco usage: 4 minutes. I have asked about smoking and is smoking same as usual. The patient is advised to quit. The patient is not willing to quit. They would like to try to quit in the next 6 months. We will follow up with them in 6 months.  

## 2018-01-26 NOTE — Assessment & Plan Note (Signed)
Flu shot given. Shingrix counseled. Tetanus up to date. Colonoscopy up to date. Mammogram with gyn, pap smear with gyn. Counseled about sun safety and mole surveillance. Counseled about the dangers of distracted driving. Given 10 year screening recommendations.

## 2018-01-26 NOTE — Assessment & Plan Note (Signed)
Checking TSH and free T4, adjust synthroid 50 mcg daily as needed.  

## 2018-01-28 ENCOUNTER — Other Ambulatory Visit: Payer: Self-pay | Admitting: Internal Medicine

## 2018-01-28 DIAGNOSIS — E039 Hypothyroidism, unspecified: Secondary | ICD-10-CM

## 2018-01-28 MED ORDER — LEVOTHYROXINE SODIUM 75 MCG PO TABS: 75.0000 ug | ORAL_TABLET | Freq: Every day | ORAL | 1 refills | Status: DC

## 2018-01-28 MED ORDER — LORCASERIN HCL ER 20 MG PO TB24: 20.0000 mg | ORAL_TABLET | Freq: Every day | ORAL | 3 refills | Status: DC

## 2018-02-01 ENCOUNTER — Telehealth: Payer: Self-pay

## 2018-02-01 ENCOUNTER — Telehealth: Payer: Self-pay | Admitting: *Deleted

## 2018-02-01 NOTE — Telephone Encounter (Signed)
Belviq XR 20 mg PA initiated via CoverMyMeds. Key: [id number]  Patient ID: [id number]

## 2018-02-01 NOTE — Telephone Encounter (Signed)
Copied from [place] [id number]. Topic: General - Other >> Feb 01, 2018 11:30 AM Leward Quan A wrote: Reason for CRM: Broadus John with [employer] called needed clarification on answer to this question from prior authorization.  What is the clinical rationalle how an additional trial for a different medication will have a different outcome from those that were previously used? Additional paper work will be faxed for completion.Marland KitchenMarland Kitchen

## 2018-02-01 NOTE — Telephone Encounter (Signed)
Received fax and has been filled out and re-faxed to insurance

## 2018-02-09 NOTE — Telephone Encounter (Signed)
PA approved 02/01/2018-07/30/2018

## 2018-02-22 DIAGNOSIS — Z1231 Encounter for screening mammogram for malignant neoplasm of breast: Secondary | ICD-10-CM | POA: Diagnosis not present

## 2018-02-22 DIAGNOSIS — Z01419 Encounter for gynecological examination (general) (routine) without abnormal findings: Secondary | ICD-10-CM | POA: Diagnosis not present

## 2018-02-22 DIAGNOSIS — Z6834 Body mass index (BMI) 34.0-34.9, adult: Secondary | ICD-10-CM | POA: Diagnosis not present

## 2018-02-22 DIAGNOSIS — Z1389 Encounter for screening for other disorder: Secondary | ICD-10-CM | POA: Diagnosis not present

## 2018-02-22 DIAGNOSIS — Z130 Encounter for screening for diseases of the blood and blood-forming organs and certain disorders involving the immune mechanism: Secondary | ICD-10-CM | POA: Diagnosis not present

## 2018-02-23 ENCOUNTER — Ambulatory Visit (HOSPITAL_COMMUNITY): Payer: BLUE CROSS/BLUE SHIELD | Admitting: Psychiatry

## 2018-02-23 ENCOUNTER — Encounter: Payer: Self-pay | Admitting: Gastroenterology

## 2018-02-23 ENCOUNTER — Ambulatory Visit: Payer: BLUE CROSS/BLUE SHIELD | Admitting: Gastroenterology

## 2018-02-23 VITALS — BP 114/72 | HR 89 | Ht 65.0 in | Wt 208.2 lb

## 2018-02-23 DIAGNOSIS — Z1211 Encounter for screening for malignant neoplasm of colon: Secondary | ICD-10-CM | POA: Diagnosis not present

## 2018-02-23 NOTE — Patient Instructions (Addendum)
You do not need colon cancer screening yet. Your last colonoscopy was 02/2010 (op note included in your paperwork today).  Colonoscopy for colon cancer screening recall 02/2020.  Thank you for entrusting me with your care and choosing [facility].  Dr Ardis Hughs

## 2018-02-23 NOTE — Progress Notes (Signed)
HPI: This is a 57 year old woman who was referred to me by Hoyt Koch, *  to evaluate colon cancer screening.    Chief complaint is " I am due for a colonoscopy"  She has mild, chronic constipation.  Takes a laxative periodically.  This has not changed recently  She does not see any overt GI bleeding  Weight increased 40 pounds in 1 year.  No FH of colon  Cancer.   Old Data Reviewed: Labs November 2019 showed normal CBC and normal complete metabolic profile.  Colonoscopy, 2011, Dr. Howell Rucks for rectal bleeding found a single small rectal polyp that was hyperplastic on pathology.  No diverticulosis.   Review of systems: Pertinent positive and negative review of systems were noted in the above HPI section. All other review negative.   Past Medical History:  Diagnosis Date  . Anxiety   . Arthritis   . Back pain   . Bipolar affective disorder ([place])   . Depression   . Genital herpes   . History of chicken pox   . Hypothyroidism     Past Surgical History:  Procedure Laterality Date  . CESAREAN SECTION  1993  . COLONOSCOPY  2004   neg  . [place]  2006  . LAPAROSCOPIC SALPINGO OOPHERECTOMY Left 12/19/2014   Procedure: LAPAROSCOPIC LEFT SALPINGO OOPHORECTOMY;  Surgeon: Cheri Fowler, MD;  Location: [facility];  Service: Gynecology;  Laterality: Left;  . LAPAROSCOPY N/A 12/19/2014   Procedure: LAPAROSCOPY OPERATIVE;  Surgeon: Cheri Fowler, MD;  Location: [facility];  Service: Gynecology;  Laterality: N/A;  . TONSILLECTOMY  1981  . TUBAL LIGATION  1994    Current Outpatient Medications  Medication Sig Dispense Refill  . Cyanocobalamin (VITAMIN B-12 PO) Take by mouth.    . diclofenac sodium (VOLTAREN) 1 % GEL Apply topically to affected area qid 100 g 3  . GLUCOSAMINE HCL PO Take 1 tablet by mouth 2 (two) times daily.    Marland Kitchen levothyroxine (SYNTHROID, LEVOTHROID) 75 MCG tablet Take 1 tablet (75 mcg total) by mouth daily before breakfast. 90 tablet 1  .  TURMERIC PO Take by mouth.    Marland Kitchen VITAMIN D, CHOLECALCIFEROL, PO Take 1,000 Units by mouth daily.     . pantoprazole (PROTONIX) 40 MG tablet TAKE 1 TABLET(40 MG) BY MOUTH DAILY (Patient not taking: Reported on 01/25/2018) 90 tablet 2  . simvastatin (ZOCOR) 40 MG tablet Take 1 tablet (40 mg total) by mouth at bedtime. (Patient not taking: Reported on 01/25/2018) 90 tablet 3   No current facility-administered medications for this visit.     Allergies as of 02/23/2018 - Review Complete 02/23/2018  Allergen Reaction Noted  . Bee venom Anaphylaxis 06/29/2012    Family History  Problem Relation Age of Onset  . Arthritis Mother   . Alcohol abuse Sister   . Cancer Neg Hx   . Diabetes Neg Hx   . Stroke Neg Hx   . Heart disease Neg Hx     Social History   Socioeconomic History  . Marital status: Divorced    Spouse name: Not on file  . Number of children: 1  . Years of education: 56  . Highest education level: Not on file  Occupational History  . Occupation: Retired  Scientific laboratory technician  . Financial resource strain: Not on file  . Food insecurity:    Worry: Not on file    Inability: Not on file  . Transportation needs:    Medical: Not on file  Non-medical: Not on file  Tobacco Use  . Smoking status: Current Every Day Smoker    Packs/day: 0.50    Years: 33.00    Pack years: 16.50  . Smokeless tobacco: Never Used  . Tobacco comment: smoked 1983- present , up to 1/3 ppd  Substance and Sexual Activity  . Alcohol use: Yes  . Drug use: No  . Sexual activity: Yes  Lifestyle  . Physical activity:    Days per week: Not on file    Minutes per session: Not on file  . Stress: Not on file  Relationships  . Social connections:    Talks on phone: Not on file    Gets together: Not on file    Attends religious service: Not on file    Active member of club or organization: Not on file    Attends meetings of clubs or organizations: Not on file    Relationship status: Not on file  . Intimate  partner violence:    Fear of current or ex partner: Not on file    Emotionally abused: Not on file    Physically abused: Not on file    Forced sexual activity: Not on file  Other Topics Concern  . Not on file  Social History Narrative   Regular exercise-no   Caffeine Use-yes     Physical Exam: BP 114/72   Pulse 89   Ht 5\' 5"  (1.651 m)   Wt 208 lb 4 oz (94.5 kg)   BMI 34.65 kg/m  Constitutional: generally well-appearing Psychiatric: alert and oriented x3 Eyes: extraocular movements intact Mouth: oral pharynx moist, no lesions Neck: supple no lymphadenopathy Cardiovascular: heart regular rate and rhythm Lungs: clear to auscultation bilaterally Abdomen: soft, nontender, nondistended, no obvious ascites, no peritoneal signs, normal bowel sounds Extremities: no lower extremity edema bilaterally Skin: no lesions on visible extremities   Assessment and plan: 57 y.o. female with routine risk for colon cancer mild chronic constipation  She thought that her most recent colon cancer screening colonoscopy was in 2004 however it looks like it was actually December 2011.  I pulled up the operative report from her previous gastroenterologist that included multiple history points that correlate with her own history.  She does not remember this colonoscopy at all however I do think that it actually did happen on that day in December 2011.  The polyp that was removed was not precancerous.  She does not have any new concerning GI symptoms and does not have a family history of colon cancer.  I told her that despite what her insurance company told her and what her primary care physician told her that she actually does not need colon cancer screening until December 2011.  She was not very happy with this recommendation.  I provided her with her previous operative report from the colonoscopy December 2011 and also the associated pathology report.  We will put her in our reminder system for repeat  colonoscopy 2021, December.   Please see the "Patient Instructions" section for addition details about the plan.   Owens Loffler, MD [facility] 02/23/2018, 9:18 AM  Cc: Hoyt Koch, *

## 2018-03-02 ENCOUNTER — Other Ambulatory Visit: Payer: Self-pay | Admitting: Obstetrics and Gynecology

## 2018-03-02 DIAGNOSIS — R928 Other abnormal and inconclusive findings on diagnostic imaging of breast: Secondary | ICD-10-CM

## 2018-03-04 ENCOUNTER — Other Ambulatory Visit: Payer: Self-pay | Admitting: Internal Medicine

## 2018-03-04 ENCOUNTER — Ambulatory Visit
Admission: RE | Admit: 2018-03-04 | Discharge: 2018-03-04 | Disposition: A | Discharge status: Home / Self Care | Payer: BLUE CROSS/BLUE SHIELD | Source: Ambulatory Visit | Attending: Obstetrics and Gynecology | Admitting: Obstetrics and Gynecology

## 2018-03-04 DIAGNOSIS — N6009 Solitary cyst of unspecified breast: Secondary | ICD-10-CM

## 2018-03-04 DIAGNOSIS — R928 Other abnormal and inconclusive findings on diagnostic imaging of breast: Secondary | ICD-10-CM

## 2018-03-04 DIAGNOSIS — N6002 Solitary cyst of left breast: Secondary | ICD-10-CM

## 2018-03-04 DIAGNOSIS — N6322 Unspecified lump in the left breast, upper inner quadrant: Secondary | ICD-10-CM | POA: Diagnosis not present

## 2018-03-18 ENCOUNTER — Ambulatory Visit: Payer: BLUE CROSS/BLUE SHIELD | Admitting: Sports Medicine

## 2018-09-28 ENCOUNTER — Ambulatory Visit
Admission: RE | Admit: 2018-09-28 | Discharge: 2018-09-28 | Disposition: A | Discharge status: Home / Self Care | Payer: Medicare Other | Source: Ambulatory Visit | Attending: Internal Medicine | Admitting: Internal Medicine

## 2018-09-28 ENCOUNTER — Other Ambulatory Visit: Payer: Self-pay

## 2018-09-28 DIAGNOSIS — N6002 Solitary cyst of left breast: Secondary | ICD-10-CM

## 2019-03-31 ENCOUNTER — Encounter: Payer: Medicare Other | Admitting: Internal Medicine

## 2019-07-08 IMAGING — MR MR KNEE*L* W/O CM
4 of 6 series · 19 of 40 positions shown · non-contrast
Comparison: None.

CLINICAL DATA: Chronic left knee pain status post fall.

EXAM:
MRI OF THE LEFT KNEE WITHOUT CONTRAST
TECHNIQUE: Multiplanar, multisequence MR imaging of the knee was performed. No
intravenous contrast was administered.

[Series 5: PD fat-sat · axial · 4.0mm · 0.31mm/px · z∈[-56,+53]mm · 8 of 25 slices shown (1 of 4)]
[im 1/25]
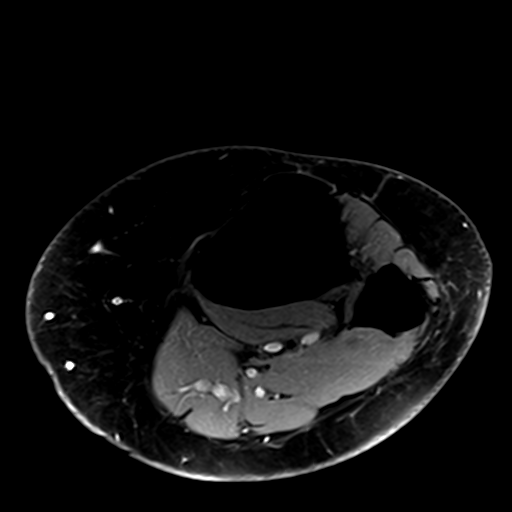
[im 4/25]
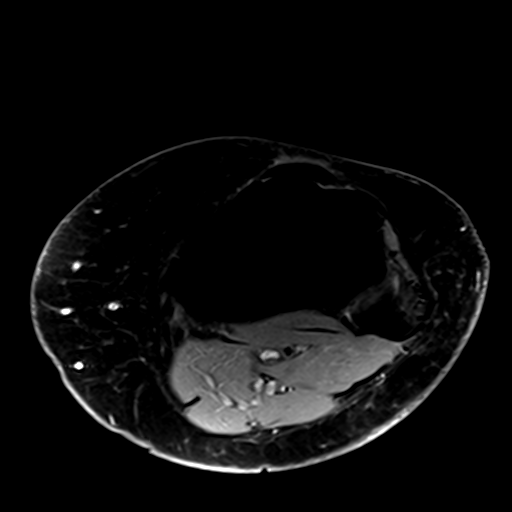
[im 7/25]
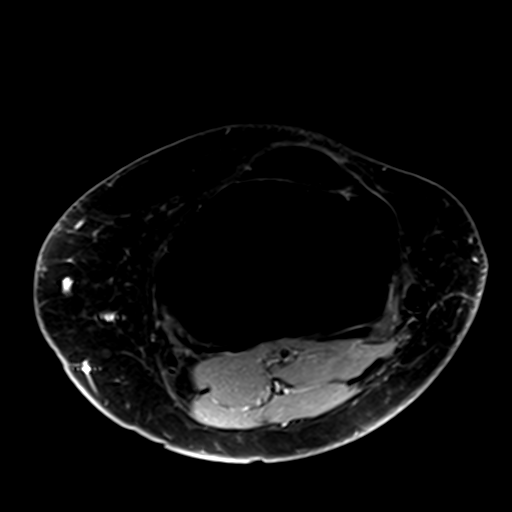
[im 11/25]
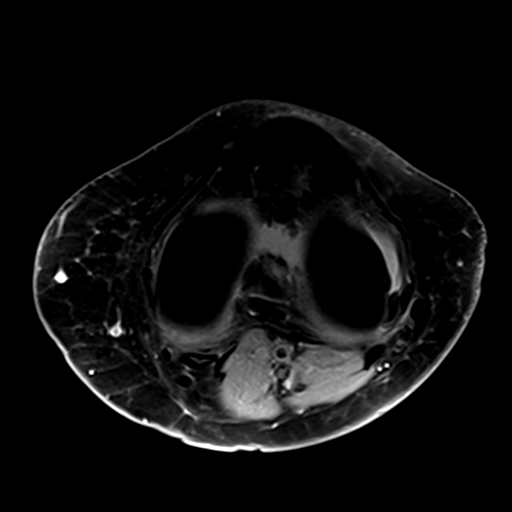
[im 14/25]
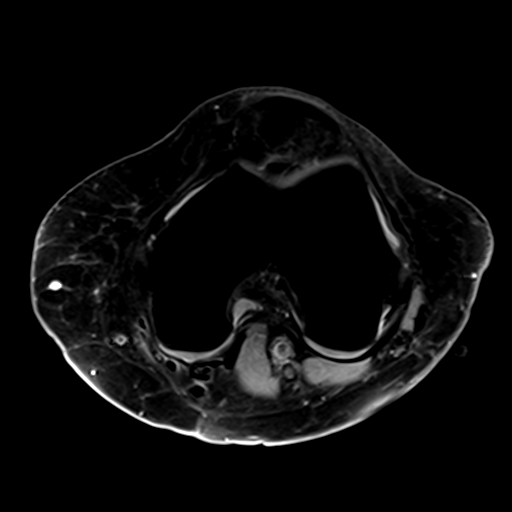
[im 18/25]
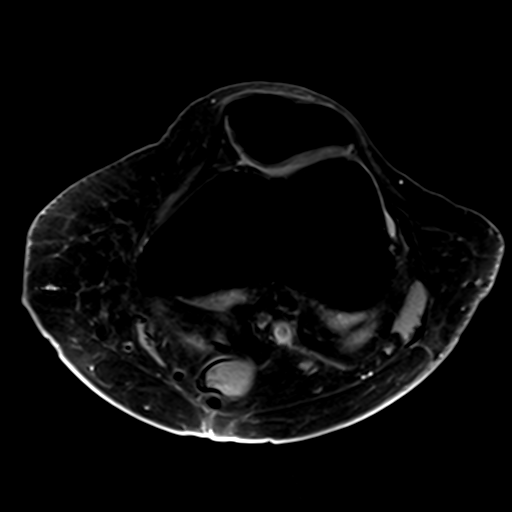
[im 21/25]
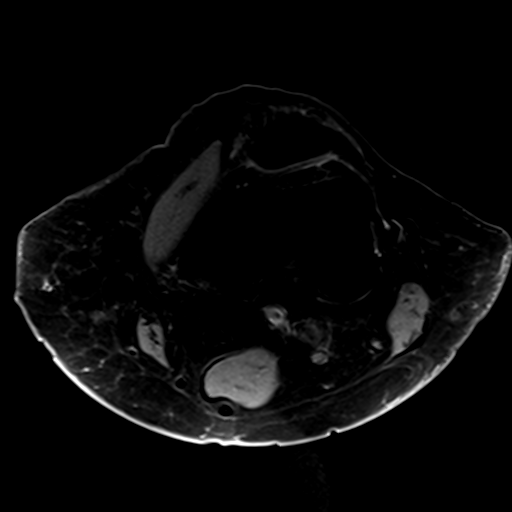
[im 25/25]
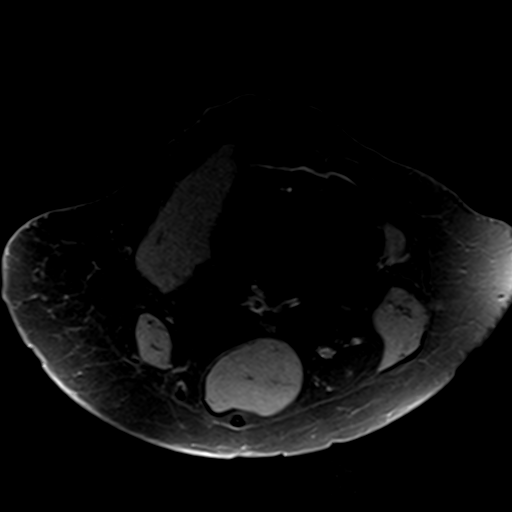

[Series 7: PD fat-sat · sagittal · 3.5mm · 0.31mm/px · 5 of 27 slices shown (2 of 4)]
[im 1/27]
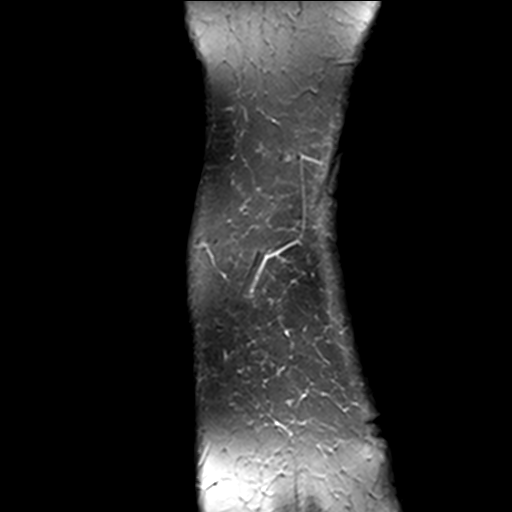
[im 4/27]
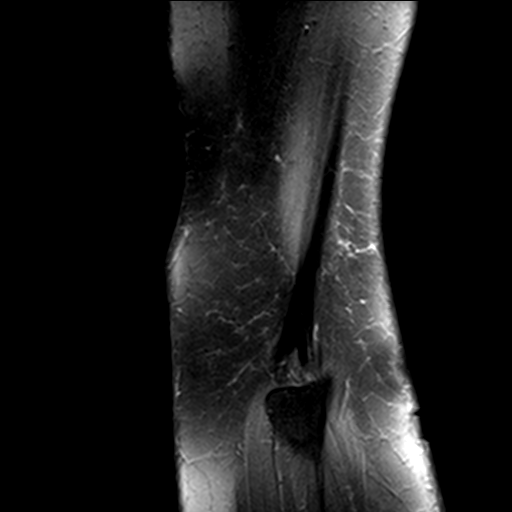
[im 8/27]
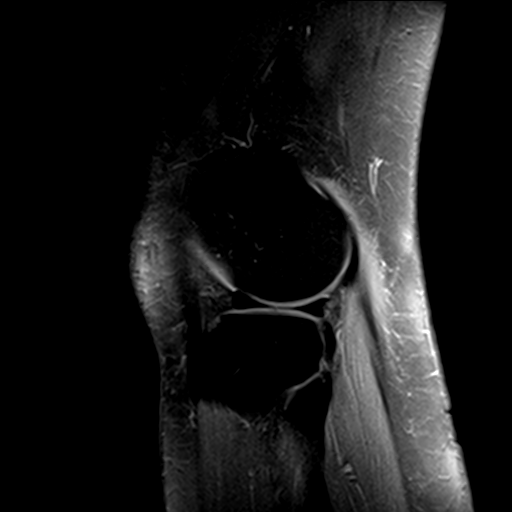
[im 15/27]
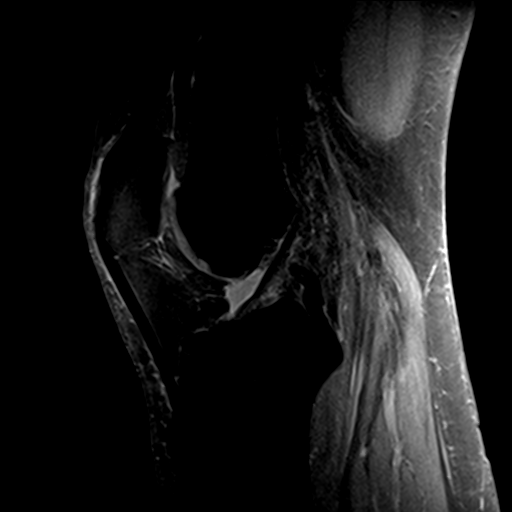
[im 23/27]
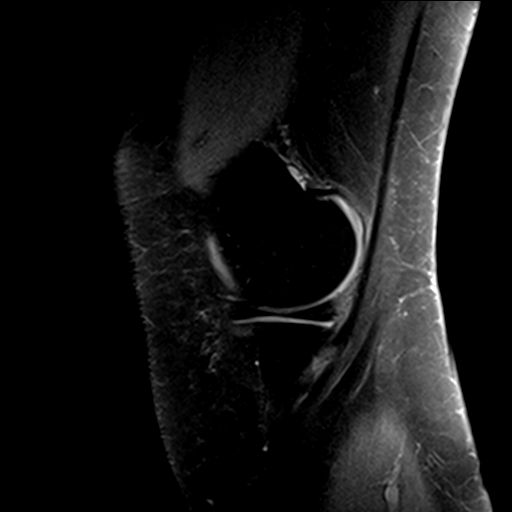

[Series 10: PD fat-sat · coronal · 2.5mm · 0.29mm/px · 3 of 11 slices shown (3 of 4)]
[im 1/11]
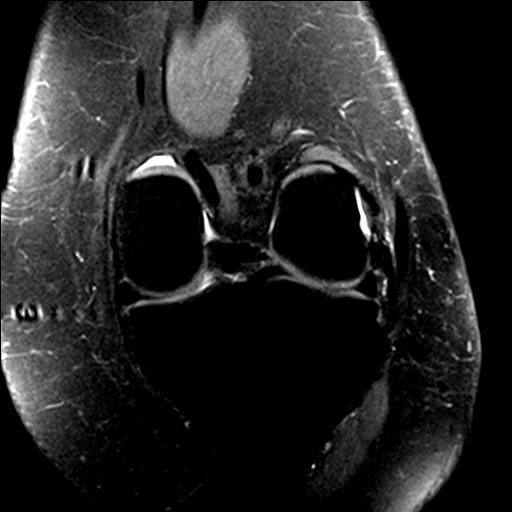
[im 6/11]
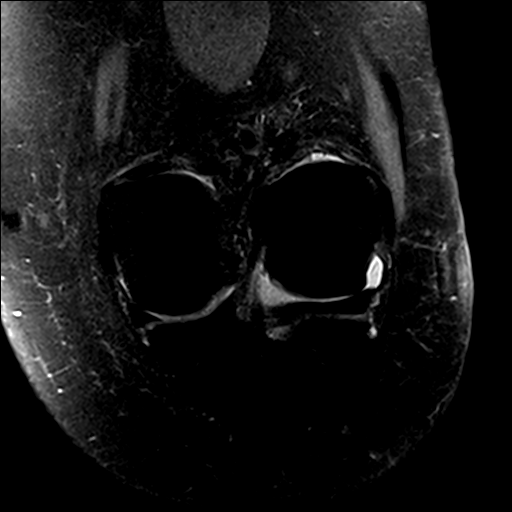
[im 11/11]
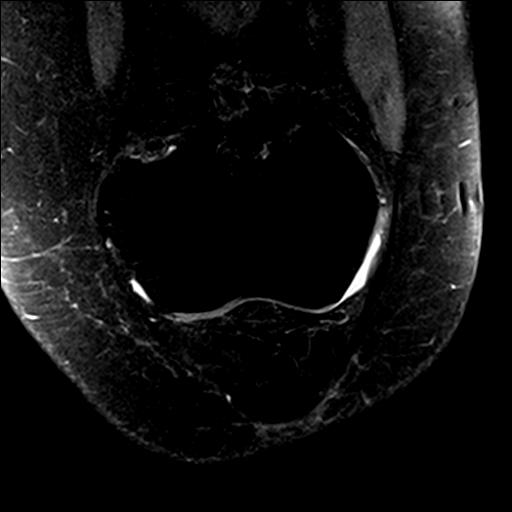

[Series 12: PD fat-sat · coronal · 3.5mm · 0.31mm/px · 3 of 22 slices shown (4 of 4)]
[im 4/22]
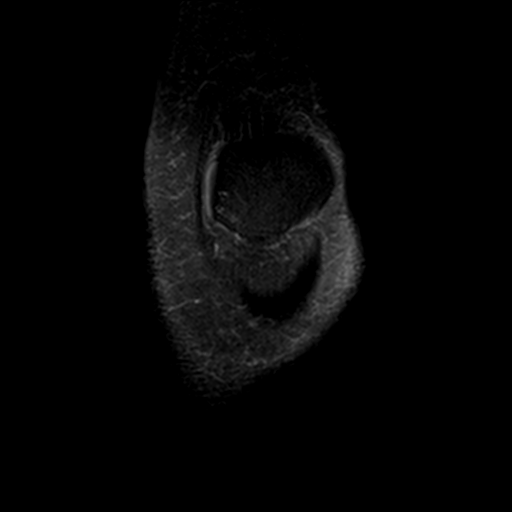
[im 11/22]
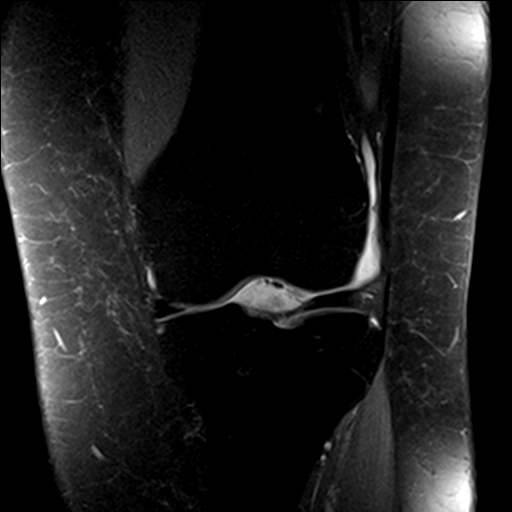
[im 18/22]
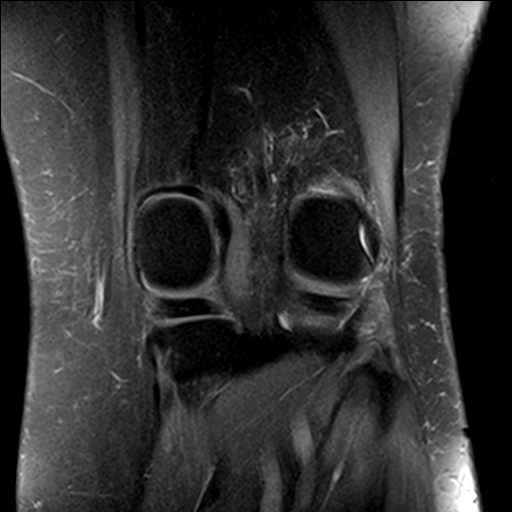

[19 of 40 positions shown; findings below may reference images not displayed]

FINDINGS: MENISCI

Medial meniscus:  Intact.

Lateral meniscus:  Intact.

LIGAMENTS

Cruciates:  Intact ACL and PCL.

Collaterals: Medial collateral ligament is intact. Lateral
collateral ligament complex is intact.

CARTILAGE

Patellofemoral: Partial-thickness cartilage loss of the medial
patellofemoral compartment.

Medial:  No chondral defect.

Lateral:  No chondral defect.

Joint: No joint effusion. Mild edema in Hoffa's fat. No plical
thickening.

Popliteal Fossa:  No Baker cyst. Intact popliteus tendon.

Extensor Mechanism: Intact quadriceps tendon and patellar tendon.
Intact medial and lateral patellar retinaculum. Intact MPFL.

Bones:  No marrow signal abnormality.  No fracture or dislocation.

Other: No fluid collection or hematoma.
IMPRESSION: 1. No meniscal or ligamentous injury of the left knee.
2. Partial-thickness cartilage loss of the medial patellofemoral
compartment.

## 2019-09-05 ENCOUNTER — Encounter: Payer: Self-pay | Admitting: Internal Medicine

## 2019-09-05 ENCOUNTER — Other Ambulatory Visit: Payer: Self-pay

## 2019-09-05 ENCOUNTER — Ambulatory Visit (INDEPENDENT_AMBULATORY_CARE_PROVIDER_SITE_OTHER): Payer: Medicare HMO | Admitting: Internal Medicine

## 2019-09-05 VITALS — BP 118/72 | HR 108 | Temp 98.0°F | Ht 65.0 in | Wt 210.0 lb

## 2019-09-05 DIAGNOSIS — Z13220 Encounter for screening for lipoid disorders: Secondary | ICD-10-CM

## 2019-09-05 DIAGNOSIS — F319 Bipolar disorder, unspecified: Secondary | ICD-10-CM | POA: Diagnosis not present

## 2019-09-05 DIAGNOSIS — Z0000 Encounter for general adult medical examination without abnormal findings: Secondary | ICD-10-CM

## 2019-09-05 DIAGNOSIS — E039 Hypothyroidism, unspecified: Secondary | ICD-10-CM | POA: Diagnosis not present

## 2019-09-05 DIAGNOSIS — D519 Vitamin B12 deficiency anemia, unspecified: Secondary | ICD-10-CM

## 2019-09-05 DIAGNOSIS — F17200 Nicotine dependence, unspecified, uncomplicated: Secondary | ICD-10-CM | POA: Diagnosis not present

## 2019-09-05 DIAGNOSIS — R7301 Impaired fasting glucose: Secondary | ICD-10-CM

## 2019-09-05 LAB — COMPREHENSIVE METABOLIC PANEL
ALT: 16 U/L (ref 0–35)
AST: 17 U/L (ref 0–37)
Albumin: 4.2 g/dL (ref 3.5–5.2)
Alkaline Phosphatase: 126 U/L — ABNORMAL HIGH (ref 39–117)
BUN: 13 mg/dL (ref 6–23)
CO2: 24 mEq/L (ref 19–32)
Calcium: 9.7 mg/dL (ref 8.4–10.5)
Chloride: 105 mEq/L (ref 96–112)
Creatinine, Ser: 0.87 mg/dL (ref 0.40–1.20)
GFR: 66.27 mL/min (ref >60.00)
Glucose, Bld: 108 mg/dL — ABNORMAL HIGH (ref 70–99)
Potassium: 3.4 mEq/L — ABNORMAL LOW (ref 3.5–5.1)
Sodium: 137 mEq/L (ref 135–145)
Total Bilirubin: 0.3 mg/dL (ref 0.2–1.2)
Total Protein: 7.1 g/dL (ref 6.0–8.3)

## 2019-09-05 LAB — HEMOGLOBIN A1C: Hgb A1c MFr Bld: 6.2 % (ref 4.6–6.5)

## 2019-09-05 LAB — TSH: TSH: 3.57 u[IU]/mL (ref 0.35–4.50)

## 2019-09-05 LAB — LIPID PANEL
Cholesterol: 233 mg/dL — ABNORMAL HIGH (ref 0–200)
HDL: 29.7 mg/dL — ABNORMAL LOW (ref >39.00)
Total CHOL/HDL Ratio: 8
Triglycerides: 415 mg/dL — ABNORMAL HIGH (ref 0.0–149.0)

## 2019-09-05 LAB — LDL CHOLESTEROL, DIRECT: Direct LDL: 140 mg/dL

## 2019-09-05 LAB — CBC
HCT: 39.1 % (ref 36.0–46.0)
Hemoglobin: 13.1 g/dL (ref 12.0–15.0)
MCHC: 33.5 g/dL (ref 30.0–36.0)
MCV: 88.5 fl (ref 78.0–100.0)
Platelets: 354 10*3/uL (ref 150.0–400.0)
RBC: 4.41 Mil/uL (ref 3.87–5.11)
RDW: 14.3 % (ref 11.5–15.5)
WBC: 12.9 10*3/uL — ABNORMAL HIGH (ref 4.0–10.5)

## 2019-09-05 LAB — VITAMIN B12: Vitamin B-12: >1526 pg/mL — ABNORMAL HIGH (ref 211–911)

## 2019-09-05 NOTE — Assessment & Plan Note (Signed)
Counseled to quit but she declines today as it is not a good opportunity to make an attempt to quit. Reminded about risk/harm from smoking.

## 2019-09-05 NOTE — Patient Instructions (Addendum)
Health Maintenance, Female Adopting a healthy lifestyle and getting preventive care are important in promoting health and wellness. Ask your health care provider about:  The right schedule for you to have regular tests and exams.  Things you can do on your own to prevent diseases and keep yourself healthy. What should I know about diet, weight, and exercise? Eat a healthy diet   Eat a diet that includes plenty of vegetables, fruits, low-fat dairy products, and lean protein.  Do not eat a lot of foods that are high in solid fats, added sugars, or sodium. Maintain a healthy weight Body mass index (BMI) is used to identify weight problems. It estimates body fat based on height and weight. Your health care provider can help determine your BMI and help you achieve or maintain a healthy weight. Get regular exercise Get regular exercise. This is one of the most important things you can do for your health. Most adults should:  Exercise for at least 150 minutes each week. The exercise should increase your heart rate and make you sweat (moderate-intensity exercise).  Do strengthening exercises at least twice a week. This is in addition to the moderate-intensity exercise.  Spend less time sitting. Even light physical activity can be beneficial. Watch cholesterol and blood lipids Have your blood tested for lipids and cholesterol at 61 years of age, then have this test every 5 years. Have your cholesterol levels checked more often if:  Your lipid or cholesterol levels are high.  You are older than 61 years of age.  You are at high risk for heart disease. What should I know about cancer screening? Depending on your health history and family history, you may need to have cancer screening at various ages. This may include screening for:  Breast cancer.  Cervical cancer.  Colorectal cancer.  Skin cancer.  Lung cancer. What should I know about heart disease, diabetes, and high blood  pressure? Blood pressure and heart disease  High blood pressure causes heart disease and increases the risk of stroke. This is more likely to develop in people who have high blood pressure readings, are of African descent, or are overweight.  Have your blood pressure checked: ? Every 3-5 years if you are 18-39 years of age. ? Every year if you are 40 years old or older. Diabetes Have regular diabetes screenings. This checks your fasting blood sugar level. Have the screening done:  Once every three years after age 40 if you are at a normal weight and have a low risk for diabetes.  More often and at a younger age if you are overweight or have a high risk for diabetes. What should I know about preventing infection? Hepatitis B If you have a higher risk for hepatitis B, you should be screened for this virus. Talk with your health care provider to find out if you are at risk for hepatitis B infection. Hepatitis C Testing is recommended for:  Everyone born from 1945 through 1965.  Anyone with known risk factors for hepatitis C. Sexually transmitted infections (STIs)  Get screened for STIs, including gonorrhea and chlamydia, if: ? You are sexually active and are younger than 61 years of age. ? You are older than 61 years of age and your health care provider tells you that you are at risk for this type of infection. ? Your sexual activity has changed since you were last screened, and you are at increased risk for chlamydia or gonorrhea. Ask your health care provider if   you are at risk.  Ask your health care provider about whether you are at high risk for HIV. Your health care provider may recommend a prescription medicine to help prevent HIV infection. If you choose to take medicine to prevent HIV, you should first get tested for HIV. You should then be tested every 3 months for as long as you are taking the medicine. Pregnancy  If you are about to stop having your period (premenopausal) and  you may become pregnant, seek counseling before you get pregnant.  Take 400 to 800 micrograms (mcg) of folic acid every day if you become pregnant.  Ask for birth control (contraception) if you want to prevent pregnancy. Osteoporosis and menopause Osteoporosis is a disease in which the bones lose minerals and strength with aging. This can result in bone fractures. If you are 65 years old or older, or if you are at risk for osteoporosis and fractures, ask your health care provider if you should:  Be screened for bone loss.  Take a calcium or vitamin D supplement to lower your risk of fractures.  Be given hormone replacement therapy (HRT) to treat symptoms of menopause. Follow these instructions at home: Lifestyle  Do not use any products that contain nicotine or tobacco, such as cigarettes, e-cigarettes, and chewing tobacco. If you need help quitting, ask your health care provider.  Do not use street drugs.  Do not share needles.  Ask your health care provider for help if you need support or information about quitting drugs. Alcohol use  Do not drink alcohol if: ? Your health care provider tells you not to drink. ? You are pregnant, may be pregnant, or are planning to become pregnant.  If you drink alcohol: ? Limit how much you use to 0-1 drink a day. ? Limit intake if you are breastfeeding.  Be aware of how much alcohol is in your drink. In the [place] one drink equals one 12 oz bottle of beer (355 mL), one 5 oz glass of wine (148 mL), or one 1 oz glass of hard liquor (44 mL). General instructions  Schedule regular health, dental, and eye exams.  Stay current with your vaccines.  Tell your health care provider if: ? You often feel depressed. ? You have ever been abused or do not feel safe at home. Summary  Adopting a healthy lifestyle and getting preventive care are important in promoting health and wellness.  Follow your health care provider's instructions about healthy  diet, exercising, and getting tested or screened for diseases.  Follow your health care provider's instructions on monitoring your cholesterol and blood pressure. This information is not intended to replace advice given to you by your health care provider. Make sure you discuss any questions you have with your health care provider. Document Revised: 03/02/2018 Document Reviewed: 03/02/2018 Elsevier Patient Education  2020 Elsevier Inc.  

## 2019-09-05 NOTE — Assessment & Plan Note (Signed)
Checking CBC and B12 and adjust as needed. Taking oral B12 daily.

## 2019-09-05 NOTE — Progress Notes (Signed)
Subjective:   Patient ID: [name], female    DOB: 07/21/1960, 59 y.o.   MRN: [id number]  HPI Here for medicare wellness and physical, no new complaints. Please see A/P for status and treatment of chronic medical problems.   Diet: heart healthy Physical activity: sedentary Depression/mood screen: negative Hearing: intact to whispered voice Visual acuity: grossly normal with lens, performs annual eye exam  ADLs: capable Fall risk: none Home safety: good Cognitive evaluation: intact to orientation, naming, recall and repetition EOL planning: adv directives discussed    Office Visit from 09/05/2019 in [place] at [facility] Total Score 0        I have personally reviewed and have noted 1. The patient's medical and social history - reviewed today no changes 2. Their use of alcohol, tobacco or illicit drugs 3. Their current medications and supplements 4. The patient's functional ability including ADL's, fall risks, home safety risks and hearing or visual impairment. 5. Diet and physical activities 6. Evidence for depression or mood disorders 7. Care team reviewed and updated  Patient Care Team: Hoyt Koch, MD as PCP - General (Internal Medicine) Past Medical History:  Diagnosis Date  . Anxiety   . Arthritis   . Back pain   . Bipolar affective disorder ([place])   . Depression   . Genital herpes   . History of chicken pox   . Hypothyroidism    Past Surgical History:  Procedure Laterality Date  . CESAREAN SECTION  1993  . COLONOSCOPY  2004   neg  . [place]  2006  . LAPAROSCOPIC SALPINGO OOPHERECTOMY Left 12/19/2014   Procedure: LAPAROSCOPIC LEFT SALPINGO OOPHORECTOMY;  Surgeon: Cheri Fowler, MD;  Location: [facility];  Service: Gynecology;  Laterality: Left;  . LAPAROSCOPY N/A 12/19/2014   Procedure: LAPAROSCOPY OPERATIVE;  Surgeon: Cheri Fowler, MD;  Location: [facility];  Service: Gynecology;  Laterality: N/A;  .  TONSILLECTOMY  1981  . TUBAL LIGATION  1994   Family History  Problem Relation Age of Onset  . Arthritis Mother   . Alcohol abuse Sister   . Cancer Neg Hx   . Diabetes Neg Hx   . Stroke Neg Hx   . Heart disease Neg Hx    Review of Systems  Constitutional: Negative.   HENT: Negative.   Eyes: Negative.   Respiratory: Negative for cough, chest tightness and shortness of breath.   Cardiovascular: Negative for chest pain, palpitations and leg swelling.  Gastrointestinal: Negative for abdominal distention, abdominal pain, constipation, diarrhea, nausea and vomiting.  Musculoskeletal: Negative.   Skin: Negative.   Neurological: Negative.   Psychiatric/Behavioral: Negative.     Objective:  Physical Exam Constitutional:      Appearance: She is well-developed.  HENT:     Head: Normocephalic and atraumatic.  Cardiovascular:     Rate and Rhythm: Normal rate and regular rhythm.  Pulmonary:     Effort: Pulmonary effort is normal. No respiratory distress.     Breath sounds: Normal breath sounds. No wheezing or rales.  Abdominal:     General: Bowel sounds are normal. There is no distension.     Palpations: Abdomen is soft.     Tenderness: There is no abdominal tenderness. There is no rebound.  Musculoskeletal:     Cervical back: Normal range of motion.  Skin:    General: Skin is warm and dry.  Neurological:     Mental Status: She is alert and oriented to person, place, and  time.     Coordination: Coordination normal.     Vitals:   09/05/19 1446  BP: 118/72  Pulse: (!) 108  Temp: 98 F (36.7 C)  TempSrc: Oral  SpO2: 97%  Weight: 210 lb (95.3 kg)  Height: 5\' 5"  (1.651 m)   This visit occurred during the SARS-CoV-2 public health emergency.  Safety protocols were in place, including screening questions prior to the visit, additional usage of staff PPE, and extensive cleaning of exam room while observing appropriate contact time as indicated for disinfecting solutions.    Assessment & Plan:

## 2019-09-05 NOTE — Assessment & Plan Note (Signed)
Flu shot due next season. Covid-19 declines. Shingrix counseled declines. Tetanus due 2024. Colonoscopy due Dec 2021. Mammogram due and will schedule with her gyn for this, pap smear due will schedule with her gyn for this. Counseled about sun safety and mole surveillance. Counseled about the dangers of distracted driving. Given 10 year screening recommendations.

## 2019-09-05 NOTE — Assessment & Plan Note (Signed)
Not taking meds in some time. Previously on synthroid 75 mcg daily. Checking TSH and adjust as needed.

## 2019-09-06 ENCOUNTER — Telehealth: Payer: Self-pay | Admitting: Internal Medicine

## 2019-09-06 NOTE — Telephone Encounter (Signed)
New message:   Pt is calling and states she was just here on yesterday and thinks she has now broken a toe on her left foot. Pt would like and order for an x-ray. Please advise.

## 2019-09-07 ENCOUNTER — Telehealth: Payer: Self-pay | Admitting: Internal Medicine

## 2019-09-07 NOTE — Telephone Encounter (Signed)
New message:   LVM for pt to call back and schedule an appt to evaluate a possibly broken toe. Please advise.

## 2019-09-07 NOTE — Telephone Encounter (Signed)
New message:   Pt called back and states she will just go to Urgent Care to get an X-ray.

## 2020-02-06 DIAGNOSIS — R3121 Asymptomatic microscopic hematuria: Secondary | ICD-10-CM | POA: Diagnosis not present

## 2020-02-06 DIAGNOSIS — Z1231 Encounter for screening mammogram for malignant neoplasm of breast: Secondary | ICD-10-CM | POA: Diagnosis not present

## 2020-02-06 DIAGNOSIS — Z130 Encounter for screening for diseases of the blood and blood-forming organs and certain disorders involving the immune mechanism: Secondary | ICD-10-CM | POA: Diagnosis not present

## 2020-02-06 DIAGNOSIS — Z6831 Body mass index (BMI) 31.0-31.9, adult: Secondary | ICD-10-CM | POA: Diagnosis not present

## 2020-02-06 DIAGNOSIS — Z1389 Encounter for screening for other disorder: Secondary | ICD-10-CM | POA: Diagnosis not present

## 2020-02-06 DIAGNOSIS — Z01419 Encounter for gynecological examination (general) (routine) without abnormal findings: Secondary | ICD-10-CM | POA: Diagnosis not present

## 2020-02-26 ENCOUNTER — Other Ambulatory Visit: Payer: Self-pay

## 2020-02-26 ENCOUNTER — Encounter (HOSPITAL_COMMUNITY): Payer: Self-pay

## 2020-02-26 ENCOUNTER — Emergency Department (HOSPITAL_COMMUNITY)
Admission: EM | Admit: 2020-02-26 | Discharge: 2020-02-27 | Disposition: A | Discharge status: Home / Self Care | Payer: Medicare HMO | Attending: Emergency Medicine | Admitting: Emergency Medicine

## 2020-02-26 DIAGNOSIS — R1011 Right upper quadrant pain: Secondary | ICD-10-CM | POA: Diagnosis not present

## 2020-02-26 DIAGNOSIS — R110 Nausea: Secondary | ICD-10-CM | POA: Insufficient documentation

## 2020-02-26 DIAGNOSIS — F17200 Nicotine dependence, unspecified, uncomplicated: Secondary | ICD-10-CM | POA: Diagnosis not present

## 2020-02-26 DIAGNOSIS — R1013 Epigastric pain: Secondary | ICD-10-CM | POA: Diagnosis not present

## 2020-02-26 DIAGNOSIS — E039 Hypothyroidism, unspecified: Secondary | ICD-10-CM | POA: Diagnosis not present

## 2020-02-26 DIAGNOSIS — Q446 Cystic disease of liver: Secondary | ICD-10-CM | POA: Diagnosis not present

## 2020-02-26 LAB — COMPREHENSIVE METABOLIC PANEL
ALT: 14 U/L (ref 0–44)
AST: 15 U/L (ref 15–41)
Albumin: 3.9 g/dL (ref 3.5–5.0)
Alkaline Phosphatase: 116 U/L (ref 38–126)
Anion gap: 12 (ref 5–15)
BUN: 9 mg/dL (ref 8–23)
CO2: 24 mmol/L (ref 22–32)
Calcium: 9.4 mg/dL (ref 8.9–10.3)
Chloride: 102 mmol/L (ref 98–111)
Creatinine, Ser: 0.57 mg/dL (ref 0.44–1.00)
GFR, Estimated: >60 mL/min (ref >60)
Glucose, Bld: 98 mg/dL (ref 70–99)
Potassium: 3.6 mmol/L (ref 3.5–5.1)
Sodium: 138 mmol/L (ref 135–145)
Total Bilirubin: 0.7 mg/dL (ref 0.3–1.2)
Total Protein: 7.4 g/dL (ref 6.5–8.1)

## 2020-02-26 LAB — CBC
HCT: 40.5 % (ref 36.0–46.0)
Hemoglobin: 13.3 g/dL (ref 12.0–15.0)
MCH: 29.7 pg (ref 26.0–34.0)
MCHC: 32.8 g/dL (ref 30.0–36.0)
MCV: 90.4 fL (ref 80.0–100.0)
Platelets: 321 10*3/uL (ref 150–400)
RBC: 4.48 MIL/uL (ref 3.87–5.11)
RDW: 14.2 % (ref 11.5–15.5)
WBC: 13.1 10*3/uL — ABNORMAL HIGH (ref 4.0–10.5)
nRBC: 0 % (ref 0.0–0.2)

## 2020-02-26 LAB — URINALYSIS, ROUTINE W REFLEX MICROSCOPIC
Bacteria, UA: NONE SEEN
Bilirubin Urine: NEGATIVE
Glucose, UA: NEGATIVE mg/dL
Ketones, ur: 5 mg/dL — AB
Leukocytes,Ua: NEGATIVE
Nitrite: NEGATIVE
Protein, ur: NEGATIVE mg/dL
Specific Gravity, Urine: 1.01 (ref 1.005–1.030)
pH: 6 (ref 5.0–8.0)

## 2020-02-26 LAB — LIPASE, BLOOD: Lipase: 62 U/L — ABNORMAL HIGH (ref 11–51)

## 2020-02-26 NOTE — ED Provider Notes (Signed)
[facility] DEPT Provider Note   CSN: [id number] Arrival date & time: 02/26/20  1937     History Chief Complaint  Patient presents with   Abdominal Pain    [name] is a 59 y.o. female.  The history is provided by the patient and medical records.  Abdominal Pain Associated symptoms: nausea     59 year old female with history of anxiety, arthritis, bipolar disorder, depression, presenting to the ED with right upper quadrant abdominal pain.  Patient states she started noticing some discomfort over the weekend but got acutely worse this morning upon waking.  Pain localized to upper abdomen and off to her right side.  She reports associated nausea but denies vomiting.  States she has been drinking fluids today but has not eaten any solid food.  The thought of eating makes her nauseated.  She denies any fever or chills.  No diarrhea.  She does report recent 50 pound weight loss by switching to low-carb/high-fat keto diet.  States pain was severe this morning but seems to have calmed down now.  Past Medical History:  Diagnosis Date   Anxiety    Arthritis    Back pain    Bipolar affective disorder ([place])    Depression    Genital herpes    History of chicken pox    Hypothyroidism     Patient Active Problem List   Diagnosis Date Noted   Osteoarthritis of spine with radiculopathy, lumbar region 02/25/2017   [facility] arthritis 02/09/2017   Constipation 04/01/2016   Routine general medical examination at a health care facility 01/09/2014   Tobacco use disorder 01/09/2014   Hypothyroidism 10/25/2013   Anemia, unspecified 10/25/2013   Genital herpes 06/29/2012    Past Surgical History:  Procedure Laterality Date   [place]  2004   neg   HEMORRHOID SURGERY  2006   LAPAROSCOPIC SALPINGO OOPHERECTOMY Left 12/19/2014   Procedure: LAPAROSCOPIC LEFT SALPINGO OOPHORECTOMY;  Surgeon: Cheri Fowler, MD;   Location: [facility];  Service: Gynecology;  Laterality: Left;   LAPAROSCOPY N/A 12/19/2014   Procedure: LAPAROSCOPY OPERATIVE;  Surgeon: Cheri Fowler, MD;  Location: [facility];  Service: Gynecology;  Laterality: N/A;   TONSILLECTOMY  1981   TUBAL LIGATION  1994     OB History   No obstetric history on file.     Family History  Problem Relation Age of Onset   Arthritis Mother    Alcohol abuse Sister    Cancer Neg Hx    Diabetes Neg Hx    Stroke Neg Hx    Heart disease Neg Hx     Social History   Tobacco Use   Smoking status: Current Every Day Smoker    Packs/day: 0.50    Years: 33.00    Pack years: 16.50   Smokeless tobacco: Never Used   Tobacco comment: smoked 1983- present , up to 1/3 ppd  Vaping Use   Vaping Use: Never used  Substance Use Topics   Alcohol use: Yes   Drug use: No    Home Medications Prior to Admission medications   Medication Sig Start Date End Date Taking? Authorizing Provider  Cyanocobalamin (VITAMIN B-12 PO) Take by mouth.    [provider]  levothyroxine (SYNTHROID, LEVOTHROID) 75 MCG tablet Take 1 tablet (75 mcg total) by mouth daily before breakfast. Patient not taking: Reported on 09/05/2019 01/28/18   Hoyt Koch, MD  simvastatin (ZOCOR) 40 MG tablet Take 1  tablet (40 mg total) by mouth at bedtime. Patient not taking: Reported on 09/05/2019 01/19/17   Hoyt Koch, MD  VITAMIN D, CHOLECALCIFEROL, PO Take 1,000 Units by mouth daily.     [provider]    Allergies    Bee venom  Review of Systems   Review of Systems  Gastrointestinal: Positive for abdominal pain and nausea.  All other systems reviewed and are negative.   Physical Exam Updated Vital Signs BP 116/63 (BP Location: Left Arm)    Pulse 87    Temp 99.3 F (37.4 C) (Oral)    Resp 16    SpO2 95%   Physical Exam Vitals and nursing note reviewed.  Constitutional:      Appearance: She is well-developed.  HENT:     Head:  Normocephalic and atraumatic.  Eyes:     Conjunctiva/sclera: Conjunctivae normal.     Pupils: Pupils are equal, round, and reactive to light.  Cardiovascular:     Rate and Rhythm: Normal rate and regular rhythm.     Heart sounds: Normal heart sounds.  Pulmonary:     Effort: Pulmonary effort is normal.     Breath sounds: Normal breath sounds.  Abdominal:     General: Bowel sounds are normal.     Palpations: Abdomen is soft.     Tenderness: There is abdominal tenderness in the right upper quadrant and epigastric area. There is no rebound.  Musculoskeletal:        General: Normal range of motion.     Cervical back: Normal range of motion.  Skin:    General: Skin is warm and dry.  Neurological:     Mental Status: She is alert and oriented to person, place, and time.     ED Results / Procedures / Treatments   Labs (all labs ordered are listed, but only abnormal results are displayed) Labs Reviewed  LIPASE, BLOOD - Abnormal; Notable for the following components:      Result Value   Lipase 62 (*)    All other components within normal limits  CBC - Abnormal; Notable for the following components:   WBC 13.1 (*)    All other components within normal limits  URINALYSIS, ROUTINE W REFLEX MICROSCOPIC - Abnormal; Notable for the following components:   Hgb urine dipstick MODERATE (*)    Ketones, ur 5 (*)    All other components within normal limits  COMPREHENSIVE METABOLIC PANEL    EKG None  Radiology US Abdomen Limited RUQ (LIVER/GB)  Result Date: 02/27/2020 CLINICAL DATA:  Right upper quadrant pain EXAM: ULTRASOUND ABDOMEN LIMITED RIGHT UPPER QUADRANT COMPARISON:  None. FINDINGS: Gallbladder: No gallstones or wall thickening visualized. No sonographic Murphy sign noted by sonographer. Common bile duct: Diameter: 4 mm Liver: Anechoic cysts are seen within the right and left liver lobes. The largest within the right liver lobe measures 1.0 x 0.8 x 1.0 cm. The largest within the left  lobe measures 0.9 x 0.6 x 1.0 cm. Within normal limits in parenchymal echogenicity. Portal vein is patent on color Doppler imaging with normal direction of blood flow towards the liver. Other: None. IMPRESSION: Normal appearing gallbladder. Hepatic cysts. Electronically Signed   By: Prudencio Pair M.D.   On: 02/27/2020 00:44    Procedures Procedures (including critical care time)  Medications Ordered in ED Medications - No data to display  ED Course  I have reviewed the triage vital signs and the nursing notes.  Pertinent labs & imaging results that  were available during my care of the patient were reviewed by me and considered in my medical decision making (see chart for details).    MDM Rules/Calculators/A&P  59 y.o. F here with epigastric and RUQ abdominal pain since this morning.  Reports nausea without vomiting.  Pain initially severe but improved since arrival in the ED.  She is afebrile, non-toxic in appearance, NAD.  RUQ > epigastric tenderness.  No peritoneal signs.  Labs grossly reassuring, lipase 62 but normal LFT's, bili, alk phos. does report recently adopting high-fat keto diet.  Concern for possible gallbladder pathology.  Patient offered pain medication but declined stating she feels comfortable currently.  Ultrasound negative for gallstones but does have noted liver cyst.  Results discussed with patient.  As she is comfortable currently, feel she is stable for discharge home.  Discussed she may need to modify diet if continued symptoms.  Will follow closely with primary care doctor.  Plan discharge home with short supply pain and nausea medication if needed.  She will return here for any new or acute changes.  Final Clinical Impression(s) / ED Diagnoses Final diagnoses:  RUQ pain  Nausea    Rx / DC Orders ED Discharge Orders         Ordered    HYDROcodone-acetaminophen (NORCO/VICODIN) 5-325 MG tablet  Every 4 hours PRN        02/27/20 0147    ondansetron (ZOFRAN ODT) 4  MG disintegrating tablet  Every 8 hours PRN        02/27/20 0147           Larene Pickett, PA-C 02/27/20 [id number]    Dina Rich Barbette Hair, MD 02/27/20 [phone]

## 2020-02-26 NOTE — ED Triage Notes (Signed)
Patient arrived with complaints of intermittent RUQ pain that started today. Declines any NVD, States she was constipated a few days ago.

## 2020-02-27 ENCOUNTER — Telehealth: Payer: Self-pay | Admitting: Internal Medicine

## 2020-02-27 ENCOUNTER — Emergency Department (HOSPITAL_COMMUNITY): Payer: Medicare HMO

## 2020-02-27 DIAGNOSIS — Q446 Cystic disease of liver: Secondary | ICD-10-CM | POA: Diagnosis not present

## 2020-02-27 MED ORDER — HYDROCODONE-ACETAMINOPHEN 5-325 MG PO TABS
1.0000 | ORAL_TABLET | ORAL | 0 refills | Status: DC | PRN
Start: 2020-02-27 — End: 2021-01-08

## 2020-02-27 MED ORDER — ONDANSETRON 4 MG PO TBDP: 4.0000 mg | ORAL_TABLET | Freq: Three times a day (TID) | ORAL | 0 refills | Status: DC | PRN

## 2020-02-27 NOTE — Telephone Encounter (Signed)
[facility]   Patient states her pain is getting worse she thinks it could be her gallbladder states its at the top of stomach and on right side states it feels like a stabbing pain. She hasn't been able to eat. Patient has been on keto since July and sometimes it causes her to have constipation but today she started having stabbing pain at the top of her stomach on the right side. Rates pain 10/10.   [facility] advised: Go to ED Now  Patient understood and complied.   Patient went to ED 02/26/20

## 2020-02-27 NOTE — Discharge Instructions (Signed)
Gallbladder looked ok on ultrasound today.  You did have liver cysts but these just need monitoring. Take the prescribed medication as directed if needed. Follow-up with your primary care doctor. Return to the ED for new or worsening symptoms.

## 2020-04-17 ENCOUNTER — Encounter: Payer: Self-pay | Admitting: Gastroenterology

## 2020-04-22 ENCOUNTER — Telehealth: Payer: Self-pay | Admitting: Gastroenterology

## 2020-04-22 NOTE — Telephone Encounter (Signed)
Inbound call from patient requesting to do Cologuard test instead of procedure.  Please advise.

## 2020-04-22 NOTE — Telephone Encounter (Signed)
Ok, please cancel her colonoscopy and order a cologuard colon cancer screening test. She does need to know that if the cologuard test is + then she will need a colonoscopy. If it is negative then she will not need colon cancer screening for 3 years.

## 2020-04-22 NOTE — Telephone Encounter (Signed)
Patient was due for recall colon in Dec 2021. She was scheduled for preop appt on 04-23-2020 and scheduled for colon on 05-07-2020.  She does not want to be Covid screened and would prefer to have cologuard instead.  Please advise.

## 2020-04-23 NOTE — Telephone Encounter (Signed)
Patient made aware of Dr Ardis Hughs' recommendations regarding Cologuard testing.  Patient advised that if test was positive that she would still need to have a colonoscopy.   Patient agreed to plan and verbalized understanding.  No further questions.  Information regarding Colorguard CPT codes was sent to patient in [place].

## 2020-05-07 ENCOUNTER — Encounter: Payer: Medicare HMO | Admitting: Gastroenterology

## 2020-05-14 DIAGNOSIS — Z1211 Encounter for screening for malignant neoplasm of colon: Secondary | ICD-10-CM | POA: Diagnosis not present

## 2020-05-14 DIAGNOSIS — Z1212 Encounter for screening for malignant neoplasm of rectum: Secondary | ICD-10-CM | POA: Diagnosis not present

## 2020-05-14 LAB — COLOGUARD: Cologuard: NEGATIVE

## 2020-05-21 LAB — COLOGUARD: COLOGUARD: NEGATIVE

## 2020-05-22 ENCOUNTER — Telehealth: Payer: Self-pay | Admitting: Gastroenterology

## 2020-05-22 LAB — COLOGUARD: Cologuard: NEGATIVE

## 2020-05-22 NOTE — Telephone Encounter (Signed)
Phone call to patient to make her aware that Cologuard results have been received. Patient advised of negative results.  Patient verbalized understanding.

## 2021-01-08 ENCOUNTER — Ambulatory Visit (INDEPENDENT_AMBULATORY_CARE_PROVIDER_SITE_OTHER): Payer: Medicare HMO | Admitting: Internal Medicine

## 2021-01-08 ENCOUNTER — Other Ambulatory Visit: Payer: Self-pay

## 2021-01-08 ENCOUNTER — Encounter: Payer: Self-pay | Admitting: Internal Medicine

## 2021-01-08 VITALS — BP 120/82 | HR 62 | Temp 97.7°F | Ht 65.0 in | Wt 187.0 lb

## 2021-01-08 DIAGNOSIS — Z13220 Encounter for screening for lipoid disorders: Secondary | ICD-10-CM

## 2021-01-08 DIAGNOSIS — Z0000 Encounter for general adult medical examination without abnormal findings: Secondary | ICD-10-CM

## 2021-01-08 DIAGNOSIS — D229 Melanocytic nevi, unspecified: Secondary | ICD-10-CM | POA: Diagnosis not present

## 2021-01-08 DIAGNOSIS — R7303 Prediabetes: Secondary | ICD-10-CM

## 2021-01-08 DIAGNOSIS — E039 Hypothyroidism, unspecified: Secondary | ICD-10-CM | POA: Diagnosis not present

## 2021-01-08 DIAGNOSIS — F17200 Nicotine dependence, unspecified, uncomplicated: Secondary | ICD-10-CM | POA: Diagnosis not present

## 2021-01-08 DIAGNOSIS — F319 Bipolar disorder, unspecified: Secondary | ICD-10-CM | POA: Diagnosis not present

## 2021-01-08 LAB — CBC
HCT: 42.8 % (ref 36.0–46.0)
Hemoglobin: 14 g/dL (ref 12.0–15.0)
MCHC: 32.7 g/dL (ref 30.0–36.0)
MCV: 90.5 fl (ref 78.0–100.0)
Platelets: 266 10*3/uL (ref 150.0–400.0)
RBC: 4.73 Mil/uL (ref 3.87–5.11)
RDW: 13.8 % (ref 11.5–15.5)
WBC: 7.4 10*3/uL (ref 4.0–10.5)

## 2021-01-08 LAB — LIPID PANEL
Cholesterol: 225 mg/dL — ABNORMAL HIGH (ref 0–200)
HDL: 35.1 mg/dL — ABNORMAL LOW (ref >39.00)
LDL Cholesterol: 157 mg/dL — ABNORMAL HIGH (ref 0–99)
NonHDL: 190.12
Total CHOL/HDL Ratio: 6
Triglycerides: 165 mg/dL — ABNORMAL HIGH (ref 0.0–149.0)
VLDL: 33 mg/dL (ref 0.0–40.0)

## 2021-01-08 LAB — COMPREHENSIVE METABOLIC PANEL
ALT: 13 U/L (ref 0–35)
AST: 17 U/L (ref 0–37)
Albumin: 4 g/dL (ref 3.5–5.2)
Alkaline Phosphatase: 96 U/L (ref 39–117)
BUN: 15 mg/dL (ref 6–23)
CO2: 24 mEq/L (ref 19–32)
Calcium: 9.5 mg/dL (ref 8.4–10.5)
Chloride: 106 mEq/L (ref 96–112)
Creatinine, Ser: 0.69 mg/dL (ref 0.40–1.20)
GFR: 93.3 mL/min (ref >60.00)
Glucose, Bld: 90 mg/dL (ref 70–99)
Potassium: 3.7 mEq/L (ref 3.5–5.1)
Sodium: 137 mEq/L (ref 135–145)
Total Bilirubin: 0.4 mg/dL (ref 0.2–1.2)
Total Protein: 6.8 g/dL (ref 6.0–8.3)

## 2021-01-08 LAB — HEMOGLOBIN A1C: Hgb A1c MFr Bld: 5.4 % (ref 4.6–6.5)

## 2021-01-08 LAB — T4, FREE: Free T4: 1.07 ng/dL (ref 0.60–1.60)

## 2021-01-08 LAB — TSH: TSH: 2.73 u[IU]/mL (ref 0.35–5.50)

## 2021-01-08 NOTE — Assessment & Plan Note (Signed)
Checking HgA1c and adjust as needed.  

## 2021-01-08 NOTE — Assessment & Plan Note (Addendum)
Advised to stop smoking. Counseled about risk for CV disease.

## 2021-01-08 NOTE — Assessment & Plan Note (Signed)
Not taking meds lately. Checking TSH and free T4 and restart if needed.

## 2021-01-08 NOTE — Progress Notes (Signed)
Subjective:   Patient ID: [name], female    DOB: 1960/07/19, 60 y.o.   MRN: [id number]  HPI Here for medicare wellness and physical, no new complaints. Please see A/P for status and treatment of chronic medical problems.   Diet: heart healthy Physical activity: sedentary Depression/mood screen: negative Hearing: intact to whispered voice Visual acuity: grossly normal, overdue for annual eye exam  ADLs: capable Fall risk: none Home safety: good Cognitive evaluation: intact to orientation, naming, recall and repetition EOL planning: adv directives discussed  Naomi Visit from 01/08/2021 in [place] at [facility] Total Score 0        Fall Risk  06/29/2012  Falls in the past year? No    I have personally reviewed and have noted 1. The patient's medical and social history - reviewed today no changes 2. Their use of alcohol, tobacco or illicit drugs 3. Their current medications and supplements 4. The patient's functional ability including ADL's, fall risks, home safety risks and hearing or visual impairment. 5. Diet and physical activities 6. Evidence for depression or mood disorders 7. Care team reviewed and updated 8.  The patient is not on an opioid pain medication.  Patient Care Team: Hoyt Koch, MD as PCP - General (Internal Medicine) Past Medical History:  Diagnosis Date   Anxiety    Arthritis    Back pain    Bipolar affective disorder ([place])    Depression    Genital herpes    History of chicken pox    Hypothyroidism    Past Surgical History:  Procedure Laterality Date   CESAREAN SECTION  1993   COLONOSCOPY  2004   neg   HEMORRHOID SURGERY  2006   LAPAROSCOPIC SALPINGO OOPHERECTOMY Left 12/19/2014   Procedure: LAPAROSCOPIC LEFT SALPINGO OOPHORECTOMY;  Surgeon: Cheri Fowler, MD;  Location: [facility];  Service: Gynecology;  Laterality: Left;   LAPAROSCOPY N/A 12/19/2014   Procedure: LAPAROSCOPY OPERATIVE;   Surgeon: Cheri Fowler, MD;  Location: [facility];  Service: Gynecology;  Laterality: N/A;   TONSILLECTOMY  1981   TUBAL LIGATION  1994   Family History  Problem Relation Age of Onset   Arthritis Mother    Alcohol abuse Sister    Cancer Neg Hx    Diabetes Neg Hx    Stroke Neg Hx    Heart disease Neg Hx    Review of Systems  Constitutional: Negative.   HENT: Negative.    Eyes: Negative.   Respiratory:  Negative for cough, chest tightness and shortness of breath.   Cardiovascular:  Negative for chest pain, palpitations and leg swelling.  Gastrointestinal:  Negative for abdominal distention, abdominal pain, constipation, diarrhea, nausea and vomiting.  Musculoskeletal: Negative.   Skin: Negative.   Neurological: Negative.   Psychiatric/Behavioral:  Positive for sleep disturbance.    Objective:  Physical Exam Constitutional:      Appearance: She is well-developed.  HENT:     Head: Normocephalic and atraumatic.  Cardiovascular:     Rate and Rhythm: Normal rate and regular rhythm.  Pulmonary:     Effort: Pulmonary effort is normal. No respiratory distress.     Breath sounds: Normal breath sounds. No wheezing or rales.  Abdominal:     General: Bowel sounds are normal. There is no distension.     Palpations: Abdomen is soft.     Tenderness: There is no abdominal tenderness. There is no rebound.  Musculoskeletal:     Cervical back: Normal  range of motion.  Skin:    General: Skin is warm and dry.  Neurological:     Mental Status: She is alert and oriented to person, place, and time.     Coordination: Coordination normal.    Vitals:   01/08/21 0957  BP: 120/82  Pulse: 62  Temp: 97.7 F (36.5 C)  TempSrc: Oral  SpO2: 97%  Weight: 187 lb (84.8 kg)  Height: 5\' 5"  (1.651 m)   This visit occurred during the SARS-CoV-2 public health emergency.  Safety protocols were in place, including screening questions prior to the visit, additional usage of staff PPE, and extensive  cleaning of exam room while observing appropriate contact time as indicated for disinfecting solutions.   Assessment & Plan:

## 2021-01-08 NOTE — Assessment & Plan Note (Signed)
Referral to dermatology.  

## 2021-01-08 NOTE — Assessment & Plan Note (Addendum)
Flu shot declines. Covid-19 declines. Pneumonia declines. Shingrix declines. Tetanus declines. Cologuard due 2025. Mammogram gets with ob/gyn due 2022, pap smear due 2022 getting with ob/gyn soon. Counseled about sun safety and mole surveillance. Counseled about the dangers of distracted driving. Given 10 year screening recommendations.

## 2021-01-08 NOTE — Patient Instructions (Addendum)
We will check the labs today.  Try magnesium for sleep.

## 2021-02-17 DIAGNOSIS — Z1231 Encounter for screening mammogram for malignant neoplasm of breast: Secondary | ICD-10-CM | POA: Diagnosis not present

## 2021-02-17 DIAGNOSIS — Z01419 Encounter for gynecological examination (general) (routine) without abnormal findings: Secondary | ICD-10-CM | POA: Diagnosis not present

## 2021-02-26 ENCOUNTER — Other Ambulatory Visit: Payer: Self-pay | Admitting: Obstetrics and Gynecology

## 2021-02-26 DIAGNOSIS — R928 Other abnormal and inconclusive findings on diagnostic imaging of breast: Secondary | ICD-10-CM

## 2021-04-02 ENCOUNTER — Other Ambulatory Visit: Payer: Medicare HMO

## 2021-04-04 ENCOUNTER — Ambulatory Visit: Payer: Medicare HMO

## 2021-04-04 ENCOUNTER — Ambulatory Visit
Admission: RE | Admit: 2021-04-04 | Discharge: 2021-04-04 | Disposition: A | Discharge status: Home / Self Care | Payer: Medicare HMO | Source: Ambulatory Visit | Attending: Obstetrics and Gynecology | Admitting: Obstetrics and Gynecology

## 2021-04-04 DIAGNOSIS — R928 Other abnormal and inconclusive findings on diagnostic imaging of breast: Secondary | ICD-10-CM | POA: Diagnosis not present

## 2021-04-24 ENCOUNTER — Other Ambulatory Visit: Payer: Medicare HMO

## 2021-06-10 ENCOUNTER — Encounter: Payer: Self-pay | Admitting: Internal Medicine

## 2021-10-08 ENCOUNTER — Ambulatory Visit: Payer: Medicare HMO

## 2021-10-08 ENCOUNTER — Ambulatory Visit (INDEPENDENT_AMBULATORY_CARE_PROVIDER_SITE_OTHER): Payer: Medicare HMO

## 2021-10-08 ENCOUNTER — Encounter: Payer: Self-pay | Admitting: Internal Medicine

## 2021-10-08 ENCOUNTER — Ambulatory Visit (INDEPENDENT_AMBULATORY_CARE_PROVIDER_SITE_OTHER): Payer: Medicare HMO | Admitting: Internal Medicine

## 2021-10-08 VITALS — BP 118/76 | HR 62 | Resp 18 | Ht 65.0 in | Wt 188.0 lb

## 2021-10-08 DIAGNOSIS — M79672 Pain in left foot: Secondary | ICD-10-CM

## 2021-10-08 DIAGNOSIS — F17200 Nicotine dependence, unspecified, uncomplicated: Secondary | ICD-10-CM

## 2021-10-08 DIAGNOSIS — R202 Paresthesia of skin: Secondary | ICD-10-CM

## 2021-10-08 DIAGNOSIS — Z136 Encounter for screening for cardiovascular disorders: Secondary | ICD-10-CM | POA: Diagnosis not present

## 2021-10-08 DIAGNOSIS — M7989 Other specified soft tissue disorders: Secondary | ICD-10-CM | POA: Diagnosis not present

## 2021-10-08 DIAGNOSIS — E039 Hypothyroidism, unspecified: Secondary | ICD-10-CM

## 2021-10-08 DIAGNOSIS — R200 Anesthesia of skin: Secondary | ICD-10-CM | POA: Diagnosis not present

## 2021-10-08 DIAGNOSIS — F319 Bipolar disorder, unspecified: Secondary | ICD-10-CM | POA: Diagnosis not present

## 2021-10-08 DIAGNOSIS — R7303 Prediabetes: Secondary | ICD-10-CM

## 2021-10-08 LAB — COMPREHENSIVE METABOLIC PANEL
ALT: 13 U/L (ref 0–35)
AST: 18 U/L (ref 0–37)
Albumin: 4.2 g/dL (ref 3.5–5.2)
Alkaline Phosphatase: 98 U/L (ref 39–117)
BUN: 13 mg/dL (ref 6–23)
CO2: 25 mEq/L (ref 19–32)
Calcium: 9.5 mg/dL (ref 8.4–10.5)
Chloride: 103 mEq/L (ref 96–112)
Creatinine, Ser: 0.76 mg/dL (ref 0.40–1.20)
GFR: 83.8 mL/min (ref >60.00)
Glucose, Bld: 87 mg/dL (ref 70–99)
Potassium: 3.7 mEq/L (ref 3.5–5.1)
Sodium: 138 mEq/L (ref 135–145)
Total Bilirubin: 0.4 mg/dL (ref 0.2–1.2)
Total Protein: 6.9 g/dL (ref 6.0–8.3)

## 2021-10-08 LAB — CBC
HCT: 41.3 % (ref 36.0–46.0)
Hemoglobin: 13.9 g/dL (ref 12.0–15.0)
MCHC: 33.6 g/dL (ref 30.0–36.0)
MCV: 89.7 fl (ref 78.0–100.0)
Platelets: 282 10*3/uL (ref 150.0–400.0)
RBC: 4.61 Mil/uL (ref 3.87–5.11)
RDW: 13.8 % (ref 11.5–15.5)
WBC: 7.2 10*3/uL (ref 4.0–10.5)

## 2021-10-08 LAB — LIPID PANEL
Cholesterol: 248 mg/dL — ABNORMAL HIGH (ref 0–200)
HDL: 43.7 mg/dL (ref >39.00)
LDL Cholesterol: 174 mg/dL — ABNORMAL HIGH (ref 0–99)
NonHDL: 204.46
Total CHOL/HDL Ratio: 6
Triglycerides: 153 mg/dL — ABNORMAL HIGH (ref 0.0–149.0)
VLDL: 30.6 mg/dL (ref 0.0–40.0)

## 2021-10-08 LAB — VITAMIN B12: Vitamin B-12: 324 pg/mL (ref 211–911)

## 2021-10-08 LAB — HEMOGLOBIN A1C: Hgb A1c MFr Bld: 5.8 % (ref 4.6–6.5)

## 2021-10-08 LAB — TSH: TSH: 2.87 u[IU]/mL (ref 0.35–5.50)

## 2021-10-08 LAB — VITAMIN D 25 HYDROXY (VIT D DEFICIENCY, FRACTURES): VITD: 44.39 ng/mL (ref 30.00–100.00)

## 2021-10-08 MED ORDER — MELOXICAM 15 MG PO TABS: 15.0000 mg | ORAL_TABLET | Freq: Every day | ORAL | 0 refills | Status: DC

## 2021-10-08 MED ORDER — EPINEPHRINE 0.3 MG/0.3ML IJ SOAJ: 0.3000 mg | INTRAMUSCULAR | 0 refills | Status: DC | PRN

## 2021-10-08 NOTE — Patient Instructions (Addendum)
We will check x-ray today of the foot and have sent in meloxicam to take daily for 1-2 weeks to see if this helps the pain.  We will check the labs today to check the tingling in the foot.

## 2021-10-08 NOTE — Progress Notes (Signed)
   Subjective:   Patient ID: [name], female    DOB: [birth year], 61 y.o.   MRN: [id number]  HPI The patient is a 61 YO female coming in for foot pain.  Review of Systems  Constitutional: Negative.   HENT: Negative.    Eyes: Negative.   Respiratory:  Negative for cough, chest tightness and shortness of breath.   Cardiovascular:  Negative for chest pain, palpitations and leg swelling.  Gastrointestinal:  Negative for abdominal distention, abdominal pain, constipation, diarrhea, nausea and vomiting.  Musculoskeletal:  Positive for myalgias.  Skin: Negative.   Neurological: Negative.   Psychiatric/Behavioral: Negative.      Objective:  Physical Exam Constitutional:      Appearance: She is well-developed.  HENT:     Head: Normocephalic and atraumatic.  Cardiovascular:     Rate and Rhythm: Normal rate and regular rhythm.  Pulmonary:     Effort: Pulmonary effort is normal. No respiratory distress.     Breath sounds: Normal breath sounds. No wheezing or rales.  Abdominal:     General: Bowel sounds are normal. There is no distension.     Palpations: Abdomen is soft.     Tenderness: There is no abdominal tenderness. There is no rebound.  Musculoskeletal:        General: Tenderness present.     Cervical back: Normal range of motion.  Skin:    General: Skin is warm and dry.  Neurological:     Mental Status: She is alert and oriented to person, place, and time.     Coordination: Coordination normal.     Vitals:   10/08/21 0905  BP: 118/76  Pulse: 62  Resp: 18  SpO2: 98%  Weight: 188 lb (85.3 kg)  Height: '5\' 5"'$  (1.651 m)    Assessment & Plan:

## 2021-10-10 DIAGNOSIS — M79672 Pain in left foot: Secondary | ICD-10-CM | POA: Insufficient documentation

## 2021-10-10 DIAGNOSIS — R200 Anesthesia of skin: Secondary | ICD-10-CM | POA: Insufficient documentation

## 2021-10-10 NOTE — Assessment & Plan Note (Signed)
Checking lipid panel to assess risk. Counseled to quit and she is trying.

## 2021-10-10 NOTE — Assessment & Plan Note (Signed)
Checking HgA1c and adjust as needed.  

## 2021-10-10 NOTE — Assessment & Plan Note (Signed)
Checking TSH and adjust as needed. Not on medication.

## 2021-10-10 NOTE — Assessment & Plan Note (Signed)
Numbness and tingling right foot. Checking TSH and CBC and CMP and vitamin D and B12. Adjust as needed.

## 2021-10-10 NOTE — Assessment & Plan Note (Addendum)
Checking X-ray given focal tenderness left side of left foot although she does not recall a specific trauma. Rx meloxicam 15 mg daily for pain.

## 2021-10-15 DIAGNOSIS — M109 Gout, unspecified: Secondary | ICD-10-CM | POA: Diagnosis not present

## 2021-10-15 DIAGNOSIS — M25572 Pain in left ankle and joints of left foot: Secondary | ICD-10-CM | POA: Diagnosis not present

## 2021-10-21 DIAGNOSIS — M10072 Idiopathic gout, left ankle and foot: Secondary | ICD-10-CM | POA: Diagnosis not present

## 2021-10-29 ENCOUNTER — Ambulatory Visit (INDEPENDENT_AMBULATORY_CARE_PROVIDER_SITE_OTHER): Payer: Medicare HMO | Admitting: Family Medicine

## 2021-10-29 ENCOUNTER — Encounter: Payer: Self-pay | Admitting: Family Medicine

## 2021-10-29 VITALS — BP 132/82 | HR 68 | Temp 97.6°F | Ht 65.0 in

## 2021-10-29 DIAGNOSIS — M79672 Pain in left foot: Secondary | ICD-10-CM | POA: Diagnosis not present

## 2021-10-29 NOTE — Patient Instructions (Signed)
Continue using ice and elevating your foot.  You can continue with over-the-counter Tylenol or ibuprofen if needed.  I have referred you to [facility] and they will call you to schedule an appointment.

## 2021-10-29 NOTE — Progress Notes (Signed)
Subjective:     Patient ID: [name], female    DOB: [birth year], 61 y.o.   MRN: [id number]  Chief Complaint  Patient presents with   Foot Pain    Left foot pain along the top and side since beginning of July, has taken predisone and antiinflammatory medications with no relief. Has been seen at urgent care twice for this as well.     HPI Patient is in today for persistent left foot pain. NKI.  She has taken oral steroids and NSAIDs without relief.  Negative left foot XR.   States pain and swelling worsen throughout the day. No leg edema. No leg pain.   Denies fever, chills, dizziness, chest pain, palpitations, shortness of breath, abdominal pain, N/V/D.      Health Maintenance Due  Topic Date Due   COVID-19 Vaccine (1) Never done   HIV Screening  Never done   Zoster Vaccines- Shingrix (1 of 2) Never done   MAMMOGRAM  08/30/2015   PAP SMEAR-Modifier  06/20/2017   INFLUENZA VACCINE  10/21/2021    Past Medical History:  Diagnosis Date   Anxiety    Arthritis    Back pain    Bipolar affective disorder ([place])    Depression    Genital herpes    History of chicken pox    Hypothyroidism     Past Surgical History:  Procedure Laterality Date   [name]   COLONOSCOPY  2004   neg   HEMORRHOID SURGERY  2006   LAPAROSCOPIC SALPINGO OOPHERECTOMY Left 12/19/2014   Procedure: LAPAROSCOPIC LEFT SALPINGO OOPHORECTOMY;  Surgeon: Lori Fowler, MD;  Location: [facility];  Service: Gynecology;  Laterality: Left;   LAPAROSCOPY N/A 12/19/2014   Procedure: LAPAROSCOPY OPERATIVE;  Surgeon: Lori Fowler, MD;  Location: [facility];  Service: Gynecology;  Laterality: N/A;   TONSILLECTOMY  1981   TUBAL LIGATION  1994    Family History  Problem Relation Age of Onset   Arthritis Mother    Alcohol abuse Sister    Cancer Neg Hx    Diabetes Neg Hx    Stroke Neg Hx    Heart disease Neg Hx     Social History   Socioeconomic History   Marital status: Divorced     Spouse name: Not on file   Number of children: 1   Years of education: 12   Highest education level: Not on file  Occupational History   Occupation: Retired  Tobacco Use   Smoking status: Every Day    Packs/day: 0.50    Years: 33.00    Total pack years: 16.50    Types: Cigarettes   Smokeless tobacco: Never   Tobacco comments:    smoked 1983- present , up to 1/3 ppd  Vaping Use   Vaping Use: Never used  Substance and Sexual Activity   Alcohol use: Yes   Drug use: No   Sexual activity: Yes  Other Topics Concern   Not on file  Social History Narrative   Regular exercise-no   Caffeine Use-yes   Social Determinants of Health   Financial Resource Strain: Not on file  Food Insecurity: Not on file  Transportation Needs: Not on file  Physical Activity: Not on file  Stress: Not on file  Social Connections: Not on file  Intimate Partner Violence: Not on file    Outpatient Medications Prior to Visit  Medication Sig Dispense Refill   EPINEPHrine (EPIPEN 2-PAK) 0.3 mg/0.3 mL IJ SOAJ injection Inject  0.3 mg into the muscle as needed for anaphylaxis. (Patient not taking: Reported on 10/29/2021) 1 each 0   meloxicam (MOBIC) 15 MG tablet Take 1 tablet (15 mg total) by mouth daily. (Patient not taking: Reported on 10/29/2021) 15 tablet 0   VITAMIN D, CHOLECALCIFEROL, PO Take 1,000 Units by mouth daily.  (Patient not taking: Reported on 10/29/2021)     No facility-administered medications prior to visit.    Allergies  Allergen Reactions   Bee Venom Anaphylaxis    ROS     Objective:    Physical Exam Constitutional:      General: She is not in acute distress.    Appearance: She is not ill-appearing.  Cardiovascular:     Rate and Rhythm: Normal rate.  Pulmonary:     Effort: Pulmonary effort is normal.  Musculoskeletal:     Right lower leg: Normal.     Left lower leg: Normal.     Right ankle: Normal.     Left ankle: Normal.     Left foot: Normal range of motion and normal  capillary refill. Swelling and tenderness present. Normal pulse.     Comments: Mild edema to dorsal aspect of left foot with TTP of MTP joints and lateral foot. No plantar tenderness. Normal sensation, cap refill, pulses and ROM  Skin:    General: Skin is warm and dry.     Capillary Refill: Capillary refill takes less than 2 seconds.  Neurological:     General: No focal deficit present.     Mental Status: She is alert and oriented to person, place, and time.     Sensory: No sensory deficit.     Motor: No weakness.     Gait: Gait abnormal.  Psychiatric:        Mood and Affect: Mood normal.        Behavior: Behavior normal.     BP 132/82 (BP Location: Left Arm, Patient Position: Sitting, Cuff Size: Large)   Pulse 68   Temp 97.6 F (36.4 C) (Temporal)   Ht '5\' 5"'$  (1.651 m)   SpO2 97%   BMI 31.28 kg/m  Wt Readings from Last 3 Encounters:  10/08/21 188 lb (85.3 kg)  01/08/21 187 lb (84.8 kg)  09/05/19 210 lb (95.3 kg)       Assessment & Plan:   Problem List Items Addressed This Visit       Other   Left foot pain - Primary    Reviewed notes and XR report from previous visit with PCP. Persistent pain. No history of injury. Conservative treatment and NSAIDs are not helpful. Refer to podiatry for further evaluation and treatment.       Relevant Orders   Ambulatory referral to Podiatry    I am having [name] "[name]" maintain her (VITAMIN D, CHOLECALCIFEROL, PO), meloxicam, and EPINEPHrine.  No orders of the defined types were placed in this encounter.

## 2021-10-29 NOTE — Assessment & Plan Note (Signed)
Reviewed notes and XR report from previous visit with PCP. Persistent pain. No history of injury. Conservative treatment and NSAIDs are not helpful. Refer to podiatry for further evaluation and treatment.

## 2021-10-31 ENCOUNTER — Encounter: Payer: Self-pay | Admitting: Podiatry

## 2021-10-31 ENCOUNTER — Ambulatory Visit (INDEPENDENT_AMBULATORY_CARE_PROVIDER_SITE_OTHER): Payer: Medicare HMO

## 2021-10-31 ENCOUNTER — Ambulatory Visit: Payer: Medicare HMO | Admitting: Podiatry

## 2021-10-31 DIAGNOSIS — M79672 Pain in left foot: Secondary | ICD-10-CM

## 2021-10-31 DIAGNOSIS — M109 Gout, unspecified: Secondary | ICD-10-CM | POA: Diagnosis not present

## 2021-10-31 DIAGNOSIS — M778 Other enthesopathies, not elsewhere classified: Secondary | ICD-10-CM

## 2021-10-31 MED ORDER — COLCHICINE 0.6 MG PO TABS: 0.6000 mg | ORAL_TABLET | Freq: Every day | ORAL | 0 refills | Status: DC

## 2021-10-31 MED ORDER — DEXAMETHASONE SODIUM PHOSPHATE 120 MG/30ML IJ SOLN
4.0000 mg | Freq: Once | INTRAMUSCULAR | Status: AC
  Administered 2021-10-31: 4 mg via INTRA_ARTICULAR

## 2021-10-31 NOTE — Progress Notes (Signed)
  Subjective:  Patient ID: [name], female    DOB: [birth year],   MRN: [id number]  Chief Complaint  Patient presents with   Foot Pain     Left heel pain / swelling     61 y.o. female presents for concern of left foot pain and swelling that has been going on since July 15th. Relates it started suddenly was seen by PCP and told it was PF and given anti-inflammatory. This did not help so was seen in urgent care and told it was gout and given meloxicam pack that did not help. Went again and given pack again and still not helpful. Then was finally referred here for evaluation.  . Denies any other pedal complaints. Denies n/v/f/c.   Past Medical History:  Diagnosis Date   Anxiety    Arthritis    Back pain    Bipolar affective disorder ([place])    Depression    Genital herpes    History of chicken pox    Hypothyroidism     Objective:  Physical Exam: Vascular: DP/PT pulses 2/4 bilateral. CFT <3 seconds. Normal hair growth on digits. No edema.  Skin. No lacerations or abrasions bilateral feet.  Musculoskeletal: MMT 5/5 bilateral lower extremities in DF, PF, Inversion and Eversion. Deceased ROM in DF of ankle joint.  Neurological: Sensation intact to light touch.   Assessment:   1. Capsulitis of left foot   2. Acute gout of left foot, unspecified cause      Plan:  Patient was evaluated and treated and all questions answered. -Xrays reviewed. No acute fractures or dislocations  -Reviewed previous notes from PCP and urgent care and labs previously.  -Discussed treatement options for gouty arthritis and gout education provided. -Patient opted for injection. After oral consent, injected  left fifth MPJ with 1cc  marcaine plain mixed with 1 cc Dexmethasone phosphate without complication; post injection care explained. -Discussed diet and modifications.  -Rx Colchicine 0.'6mg'$  -Offloading with sandal.  -Advised patient to call if symptoms are not improved within 1 week -Patient to  return in 3 weeks for re-check/further discussion for long term management of gout or sooner if condition worsens.   Lorenda Peck, DPM

## 2021-11-12 ENCOUNTER — Other Ambulatory Visit: Payer: Self-pay | Admitting: Podiatry

## 2021-11-12 ENCOUNTER — Telehealth: Payer: Self-pay | Admitting: *Deleted

## 2021-11-12 MED ORDER — COLCHICINE 0.6 MG PO TABS: 0.6000 mg | ORAL_TABLET | Freq: Every day | ORAL | 0 refills | Status: DC

## 2021-11-12 NOTE — Telephone Encounter (Signed)
Sent over refill and we will re-evaluate and follow-up. Thanks

## 2021-11-12 NOTE — Telephone Encounter (Signed)
Patient notified,has picked up already

## 2021-11-12 NOTE — Telephone Encounter (Signed)
Patient is calling because after finishing the colchicine, swelling and pain has returned  on Monday. Is it possible to get a refill? F/u appointment scheduled 09/07.  Please advise.

## 2021-11-27 ENCOUNTER — Encounter: Payer: Self-pay | Admitting: Podiatry

## 2021-11-27 ENCOUNTER — Ambulatory Visit: Payer: Medicare HMO | Admitting: Podiatry

## 2021-11-27 DIAGNOSIS — M109 Gout, unspecified: Secondary | ICD-10-CM

## 2021-11-27 DIAGNOSIS — M84375S Stress fracture, left foot, sequela: Secondary | ICD-10-CM | POA: Diagnosis not present

## 2021-11-27 DIAGNOSIS — M778 Other enthesopathies, not elsewhere classified: Secondary | ICD-10-CM | POA: Diagnosis not present

## 2021-11-27 NOTE — Progress Notes (Signed)
  Subjective:  Patient ID: [name], female    DOB: 1960-05-26,   MRN: [id number]  Chief Complaint  Patient presents with   capsulitis    Capsulitis of the left foot patient states foot is still swollen and painful near pinky toe feels a a bone is broken     61 y.o. female presents for follow-up of left foot capsulitis and gout. Relates the colchicine has helped calm dow the area but when finished need more to calm down the pain. She relates she is still getting swelling in the foot mostly in the afternoon and that's when the pain sets in. States makes it difficult to wear regular shoes.   . Denies any other pedal complaints. Denies n/v/f/c.   Past Medical History:  Diagnosis Date   Anxiety    Arthritis    Back pain    Bipolar affective disorder ([place])    Depression    Genital herpes    History of chicken pox    Hypothyroidism     Objective:  Physical Exam: Vascular: DP/PT pulses 2/4 bilateral. CFT <3 seconds. Normal hair growth on digits. No edema.  Skin. No lacerations or abrasions bilateral feet.  Musculoskeletal: MMT 5/5 bilateral lower extremities in DF, PF, Inversion and Eversion. Deceased ROM in DF of ankle joint. Tender along diaphysis of fifth metatarsal with some mild edema noted. Some pain with ROM of the fifth digit but has improved.  Neurological: Sensation intact to light touch.   Assessment:   1. Capsulitis of left foot   2. Acute gout of left foot, unspecified cause   3. Stress reaction of left foot, sequela       Plan:  Patient was evaluated and treated and all questions answered. -Xrays reviewed. No acute fractures or dislocations  -Reviewed previous notes from PCP and urgent care and labs previously.  -Discussed treatement options for gouty arthritis and gout education provided. Discussed potential it could also be a stress fracture in this area.  -Will try offloading in surgical shoe for four weeks.  -Discussed compression stocking to help with  swelling.  -Return in 4 weeks for repeat X-rays to evaluate for possible stress reaction.    Lorenda Peck, DPM

## 2021-12-25 ENCOUNTER — Ambulatory Visit (INDEPENDENT_AMBULATORY_CARE_PROVIDER_SITE_OTHER): Payer: Medicare HMO

## 2021-12-25 ENCOUNTER — Ambulatory Visit: Payer: Medicare HMO | Admitting: Podiatrist

## 2021-12-25 ENCOUNTER — Encounter: Payer: Self-pay | Admitting: Podiatrist

## 2021-12-25 DIAGNOSIS — S92322A Displaced fracture of second metatarsal bone, left foot, initial encounter for closed fracture: Secondary | ICD-10-CM | POA: Diagnosis not present

## 2021-12-25 DIAGNOSIS — S92355D Nondisplaced fracture of fifth metatarsal bone, left foot, subsequent encounter for fracture with routine healing: Secondary | ICD-10-CM | POA: Diagnosis not present

## 2021-12-25 NOTE — Patient Instructions (Signed)
Stress Fracture  A stress fracture is a small break or crack in a bone. A stress fracture can be fully broken (complete) or partially broken (incomplete). The most common sites for stress fractures are the bones in the front of your feet (metatarsals), your heel (calcaneus), and the long bone of your lower leg (tibia). What are the causes? This condition is caused by overuse or repetitive exercise, such as running. It happens when a bone cannot absorb any more shock because the muscles around it are weak. Stress fractures happen most commonly when: You rapidly increase or start a new physical activity. You use shoes that are worn out or do not fit properly. You exercise on a new surface. What increases the risk? You are more likely to develop this condition if: You have a condition that causes weak bones (osteoporosis). You are female. Stress fractures are more likely to occur in women. What are the signs or symptoms? The most common symptom of a stress fracture is feeling pain when you are using or putting weight on the affected part of your body. The pain usually improves when you are resting. Other symptoms may include: Swelling of the affected area. Pain in the area when it is touched. Stress fracture pain usually develops over time. How is this diagnosed? This condition may be diagnosed by: Your symptoms. Your medical history. A physical exam. Imaging tests, such as: X-rays. MRI. Bone scan. How is this treated? Treatment depends on the severity of your stress fracture. It is commonly treated with resting, icing, compression, and elevation (RICE therapy). Treatment may also include: Medicines to reduce inflammation. A cast or a walking shoe. Crutches. Surgery. This is usually only in severe cases. Follow these instructions at home: If you have a cast: Do not put pressure on any part of the cast until it is fully hardened. This may take several hours. Do not stick anything  inside the cast to scratch your skin. Doing that increases your risk of infection. Check the skin around the cast every day. Tell your health care provider about any concerns. You may put lotion on dry skin around the edges of the cast. Do not put lotion on the skin underneath the cast. Keep the cast clean. If the cast is not waterproof: Do not let it get wet. Cover it with a watertight covering when you take a bath or a shower. If you have a walking shoe: Wear the shoe as told by your health care provider. Remove it only as told by your health care provider. Loosen the shoe if your toes tingle, become numb, or turn cold and blue. Keep the shoe clean. If the shoe is not waterproof: Do not let it get wet. Managing pain, stiffness, and swelling  If directed, apply ice to the injured area: If you have a walking shoe, remove the shoe as told by your health care provider. Put ice in a plastic bag. Place a towel between your skin and the bag or between your cast and the bag. Leave the ice on for 20 minutes, 2-3 times per day. Move your toes often to avoid stiffness and to lessen swelling. Raise (elevate) the injured area above the level of your heart while you are sitting or lying down. Activity Rest as directed by your health care provider. Ask your health care provider if you may do alternative exercises, such as swimming or biking, while you are healing. Return to your normal activities as directed by your health care provider.  Ask your health care provider what activities are safe for you. Perform range-of-motion exercises only as directed by your health care provider. General instructions Do not use the injured limb to support your body weight until your health care provider says that you can. Use crutches if your health care provider tells you to do so. Do not use any products that contain nicotine or tobacco, such as cigarettes and e-cigarettes. These can delay bone healing. If you need  help quitting, ask your health care provider. Take over-the-counter and prescription medicines only as told by your health care provider. Keep all follow-up visits as told by your health care provider. This is important. How is this prevented? Only wear shoes that: Fit well. Are not worn out. Eat a healthy diet that contains vitamin D and calcium. This helps keep your bones strong. Good sources of calcium and vitamin D include: Low-fat dairy products such as milk, yogurt, and cheese. Certain fish, such as fresh or canned salmon, tuna, and sardines. Products that have calcium and vitamin D added to them (fortified products), such as fortified cereals or juice. Be careful when you start a new physical activity. Give your body time to adjust. Avoid doing only one kind of activity. Do different exercises, such as swimming and running, so that no single part of your body gets overused. Do strength-training exercises. Contact a health care provider if: Your pain gets worse. You have new symptoms. You have increased swelling. Get help right away if: You lose feeling in the injured area. Summary A stress fracture is a small break or crack in a bone. A stress fracture can be fully broken (complete) or partially broken (incomplete). This condition is caused by overuse or repetitive exercise, such as running. The most common symptom of a stress fracture is feeling pain when you are using or putting weight on the affected part of your body. Treatment depends on the severity of your stress fracture. This information is not intended to replace advice given to you by your health care provider. Make sure you discuss any questions you have with your health care provider. Document Revised: 06/27/2020 Document Reviewed: 06/27/2020 Elsevier Patient Education  [place].

## 2021-12-25 NOTE — Progress Notes (Signed)
  Chief Complaint  Patient presents with    left foot     Capsulitis of left foot      HPI: Patient is 61 y.o. female who presents today for follow-up of left foot pain.  She relates the pain continues to be uncomfortable and she feels like she has a broken bone.  X-rays were taken prior to today's visit.  Patient Active Problem List   Diagnosis Date Noted   Left foot pain 10/10/2021   Numbness and tingling of foot 10/10/2021   Pre-diabetes 01/08/2021   Atypical mole 01/08/2021   Osteoarthritis of spine with radiculopathy, lumbar region 02/25/2017   Constipation 04/01/2016   Routine general medical examination at a health care facility 01/09/2014   Tobacco use disorder 01/09/2014   Hypothyroidism 10/25/2013   Genital herpes 06/29/2012    Current Outpatient Medications on File Prior to Visit  Medication Sig Dispense Refill   colchicine 0.6 MG tablet Take 1 tablet (0.6 mg total) by mouth daily for 10 days. 10 tablet 0   EPINEPHrine (EPIPEN 2-PAK) 0.3 mg/0.3 mL IJ SOAJ injection Inject 0.3 mg into the muscle as needed for anaphylaxis. (Patient not taking: Reported on 10/29/2021) 1 each 0   meloxicam (MOBIC) 15 MG tablet Take 1 tablet (15 mg total) by mouth daily. (Patient not taking: Reported on 10/29/2021) 15 tablet 0   VITAMIN D, CHOLECALCIFEROL, PO Take 1,000 Units by mouth daily.  (Patient not taking: Reported on 10/29/2021)     No current facility-administered medications on file prior to visit.    Allergies  Allergen Reactions   Bee Venom Anaphylaxis    Review of Systems No fevers, chills, nausea, muscle aches, no difficulty breathing, no calf pain, no chest pain or shortness of breath.   Physical Exam  GENERAL APPEARANCE: Alert, conversant. Appropriately groomed. No acute distress.   VASCULAR: Pedal pulses palpable 2/4 DP and  PT bilateral.  Capillary refill time is immediate to all digits,  Proximal to distal cooling it warm to warm.  Digital perfusion adequate.    NEUROLOGIC: sensation is intact to 5.07 monofilament at 5/5 sites bilateral.  Light touch is intact bilateral, vibratory sensation intact bilateral  MUSCULOSKELETAL: acceptable muscle strength, tone and stability bilateral.  Pain on palpation lateral aspect of the left foot along the fifth metatarsal is noted.  DERMATOLOGIC: skin is warm, supple, and dry.  No redness, no swelling, no sign of infection is noted.  X-ray evaluation 3 views of the left foot are obtained today and compared with the last views.  At today's visit a obvious fracture is present at the midshaft/distal third of the fifth metatarsal.  It is nondisplaced, there is some bone callus already forming and it does appear to be a stress type fracture.  No other acute osseous abnormalities are seen on x-ray.  Assessment     ICD-10-CM   1. Closed nondisplaced fracture of fifth metatarsal bone of left foot with routine healing, subsequent encounter  S92.355D DG Foot Complete Left       Plan  Discussed the x-rays and exam findings with the patient.  I recommended boot immobilization for her and she was fitted with a air fracture walker at today's visit.  She is instructed to wear this at all times when up and on her feet.  She will return in 4 to 6 weeks for recheck and rex-ray of the foot and will call sooner if any problems arise.

## 2022-01-09 ENCOUNTER — Ambulatory Visit (INDEPENDENT_AMBULATORY_CARE_PROVIDER_SITE_OTHER): Payer: Medicare HMO

## 2022-01-09 DIAGNOSIS — Z0000 Encounter for general adult medical examination without abnormal findings: Secondary | ICD-10-CM

## 2022-01-09 NOTE — Patient Instructions (Signed)
[name] , Thank you for taking time to come for your Medicare Wellness Visit. I appreciate your ongoing commitment to your health goals. Please review the following plan we discussed and let me know if I can assist you in the future.   These are the goals we discussed:  Goals      Client understands the importance of follow-up with providers by attending scheduled visits        This is a list of the screening recommended for you and due dates:  Health Maintenance  Topic Date Due   COVID-19 Vaccine (1) Never done   HIV Screening  Never done   Zoster (Shingles) Vaccine (1 of 2) Never done   Mammogram  08/30/2015   Pap Smear  06/20/2017   Flu Shot  10/21/2021   Tetanus Vaccine  06/30/2022   Cologuard (Stool DNA test)  05/22/2023   Hepatitis C Screening: USPSTF Recommendation to screen - Ages 18-79 yo.  Completed   HPV Vaccine  Aged Out   Colon Cancer Screening  Discontinued    Advanced directives: Yes  Conditions/risks identified: Yes  Next appointment: Follow up in one year for your annual wellness visit.   Preventive Care 40-64 Years, Female Preventive care refers to lifestyle choices and visits with your health care provider that can promote health and wellness. What does preventive care include? A yearly physical exam. This is also called an annual well check. Dental exams once or twice a year. Routine eye exams. Ask your health care provider how often you should have your eyes checked. Personal lifestyle choices, including: Daily care of your teeth and gums. Regular physical activity. Eating a healthy diet. Avoiding tobacco and drug use. Limiting alcohol use. Practicing safe sex. Taking low-dose aspirin daily starting at age 42. Taking vitamin and mineral supplements as recommended by your health care provider. What happens during an annual well check? The services and screenings done by your health care provider during your annual well check will depend on your  age, overall health, lifestyle risk factors, and family history of disease. Counseling  Your health care provider may ask you questions about your: Alcohol use. Tobacco use. Drug use. Emotional well-being. Home and relationship well-being. Sexual activity. Eating habits. Work and work Statistician. Method of birth control. Menstrual cycle. Pregnancy history. Screening  You may have the following tests or measurements: Height, weight, and BMI. Blood pressure. Lipid and cholesterol levels. These may be checked every 5 years, or more frequently if you are over 43 years old. Skin check. Lung cancer screening. You may have this screening every year starting at age 32 if you have a 30-pack-year history of smoking and currently smoke or have quit within the past 15 years. Fecal occult blood test (FOBT) of the stool. You may have this test every year starting at age 55. Flexible sigmoidoscopy or colonoscopy. You may have a sigmoidoscopy every 5 years or a colonoscopy every 10 years starting at age 37. Hepatitis C blood test. Hepatitis B blood test. Sexually transmitted disease (STD) testing. Diabetes screening. This is done by checking your blood sugar (glucose) after you have not eaten for a while (fasting). You may have this done every 1-3 years. Mammogram. This may be done every 1-2 years. Talk to your health care provider about when you should start having regular mammograms. This may depend on whether you have a family history of breast cancer. BRCA-related cancer screening. This may be done if you have a family history of  breast, ovarian, tubal, or peritoneal cancers. Pelvic exam and Pap test. This may be done every 3 years starting at age 62. Starting at age 84, this may be done every 5 years if you have a Pap test in combination with an HPV test. Bone density scan. This is done to screen for osteoporosis. You may have this scan if you are at high risk for osteoporosis. Discuss your test  results, treatment options, and if necessary, the need for more tests with your health care provider. Vaccines  Your health care provider may recommend certain vaccines, such as: Influenza vaccine. This is recommended every year. Tetanus, diphtheria, and acellular pertussis (Tdap, Td) vaccine. You may need a Td booster every 10 years. Zoster vaccine. You may need this after age 50. Pneumococcal 13-valent conjugate (PCV13) vaccine. You may need this if you have certain conditions and were not previously vaccinated. Pneumococcal polysaccharide (PPSV23) vaccine. You may need one or two doses if you smoke cigarettes or if you have certain conditions. Talk to your health care provider about which screenings and vaccines you need and how often you need them. This information is not intended to replace advice given to you by your health care provider. Make sure you discuss any questions you have with your health care provider. Document Released: 04/05/2015 Document Revised: 11/27/2015 Document Reviewed: 01/08/2015 Elsevier Interactive Patient Education  2017 [place] Prevention in the Home Falls can cause injuries. They can happen to people of all ages. There are many things you can do to make your home safe and to help prevent falls. What can I do on the outside of my home? Regularly fix the edges of walkways and driveways and fix any cracks. Remove anything that might make you trip as you walk through a door, such as a raised step or threshold. Trim any bushes or trees on the path to your home. Use bright outdoor lighting. Clear any walking paths of anything that might make someone trip, such as rocks or tools. Regularly check to see if handrails are loose or broken. Make sure that both sides of any steps have handrails. Any raised decks and porches should have guardrails on the edges. Have any leaves, snow, or ice cleared regularly. Use sand or salt on walking paths during  winter. Clean up any spills in your garage right away. This includes oil or grease spills. What can I do in the bathroom? Use night lights. Install grab bars by the toilet and in the tub and shower. Do not use towel bars as grab bars. Use non-skid mats or decals in the tub or shower. If you need to sit down in the shower, use a plastic, non-slip stool. Keep the floor dry. Clean up any water that spills on the floor as soon as it happens. Remove soap buildup in the tub or shower regularly. Attach bath mats securely with double-sided non-slip rug tape. Do not have throw rugs and other things on the floor that can make you trip. What can I do in the bedroom? Use night lights. Make sure that you have a light by your bed that is easy to reach. Do not use any sheets or blankets that are too big for your bed. They should not hang down onto the floor. Have a firm chair that has side arms. You can use this for support while you get dressed. Do not have throw rugs and other things on the floor that can make you trip. What  can I do in the kitchen? Clean up any spills right away. Avoid walking on wet floors. Keep items that you use a lot in easy-to-reach places. If you need to reach something above you, use a strong step stool that has a grab bar. Keep electrical cords out of the way. Do not use floor polish or wax that makes floors slippery. If you must use wax, use non-skid floor wax. Do not have throw rugs and other things on the floor that can make you trip. What can I do with my stairs? Do not leave any items on the stairs. Make sure that there are handrails on both sides of the stairs and use them. Fix handrails that are broken or loose. Make sure that handrails are as long as the stairways. Check any carpeting to make sure that it is firmly attached to the stairs. Fix any carpet that is loose or worn. Avoid having throw rugs at the top or bottom of the stairs. If you do have throw rugs,  attach them to the floor with carpet tape. Make sure that you have a light switch at the top of the stairs and the bottom of the stairs. If you do not have them, ask someone to add them for you. What else can I do to help prevent falls? Wear shoes that: Do not have high heels. Have rubber bottoms. Are comfortable and fit you well. Are closed at the toe. Do not wear sandals. If you use a stepladder: Make sure that it is fully opened. Do not climb a closed stepladder. Make sure that both sides of the stepladder are locked into place. Ask someone to hold it for you, if possible. Clearly mark and make sure that you can see: Any grab bars or handrails. First and last steps. Where the edge of each step is. Use tools that help you move around (mobility aids) if they are needed. These include: Canes. Walkers. Scooters. Crutches. Turn on the lights when you go into a dark area. Replace any light bulbs as soon as they burn out. Set up your furniture so you have a clear path. Avoid moving your furniture around. If any of your floors are uneven, fix them. If there are any pets around you, be aware of where they are. Review your medicines with your doctor. Some medicines can make you feel dizzy. This can increase your chance of falling. Ask your doctor what other things that you can do to help prevent falls. This information is not intended to replace advice given to you by your health care provider. Make sure you discuss any questions you have with your health care provider. Document Released: 01/03/2009 Document Revised: 08/15/2015 Document Reviewed: 04/13/2014 Elsevier Interactive Patient Education  2017 [employer].

## 2022-01-09 NOTE — Progress Notes (Addendum)
Virtual Visit via Telephone Note  I connected with  [name] on 01/09/22 at 10:00 AM EDT by telephone and verified that I am speaking with the correct person using two identifiers.  Location: Patient: Home Provider: [place] Persons participating in the virtual visit: [name]   I discussed the limitations, risks, security and privacy concerns of performing an evaluation and management service by telephone and the availability of in person appointments. The patient expressed understanding and agreed to proceed.  Interactive audio and video telecommunications were attempted between this nurse and patient, however failed, due to patient having technical difficulties OR patient did not have access to video capability.  We continued and completed visit with audio only.  Some vital signs may be absent or patient reported.   Sheral Flow, LPN  Subjective:   [name] is a 61 y.o. female who presents for Medicare Annual (Subsequent) preventive examination.  Review of Systems     Cardiac Risk Factors include: advanced age (>79mn, >>16women)     Objective:    There were no vitals filed for this visit. There is no height or weight on file to calculate BMI.     01/09/2022   10:06 AM 02/26/2020    7:44 PM 03/11/2017   12:59 PM 09/04/2015    8:58 AM 09/01/2015    7:36 PM 12/19/2014    6:28 AM  Advanced Directives  Does Patient Have a Medical Advance Directive? No No No No No No  Would patient like information on creating a medical advance directive? No - Patient declined No - Patient declined No - Patient declined No - patient declined information  No - patient declined information    Current Medications (verified) Outpatient Encounter Medications as of 01/09/2022  Medication Sig   EPINEPHrine (EPIPEN 2-PAK) 0.3 mg/0.3 mL IJ SOAJ injection Inject 0.3 mg into the muscle as needed for anaphylaxis.   VITAMIN D, CHOLECALCIFEROL, PO Take  1,000 Units by mouth daily.   colchicine 0.6 MG tablet Take 1 tablet (0.6 mg total) by mouth daily for 10 days. (Patient not taking: Reported on 01/09/2022)   meloxicam (MOBIC) 15 MG tablet Take 1 tablet (15 mg total) by mouth daily. (Patient not taking: Reported on 10/29/2021)   No facility-administered encounter medications on file as of 01/09/2022.    Allergies (verified) Bee venom   History: Past Medical History:  Diagnosis Date   Anxiety    Arthritis    Back pain    Bipolar affective disorder ([place]    Depression    Genital herpes    History of chicken pox    Hypothyroidism    Past Surgical History:  Procedure Laterality Date   [place]  COLONOSCOPY  2004   neg   HEMORRHOID SURGERY  2006   LAPAROSCOPIC SALPINGO OOPHERECTOMY Left 12/19/2014   Procedure: LAPAROSCOPIC LEFT SALPINGO OOPHORECTOMY;  Surgeon: TCheri Fowler MD;  [facility];  Service: Gynecology;  Laterality: Left;   LAPAROSCOPY N/A 12/19/2014   Procedure: LAPAROSCOPY OPERATIVE;  Surgeon: TCheri Fowler MD;  [facility];  Service: Gynecology;  Laterality: N/A;   TONSILLECTOMY  1981   TUBAL LIGATION  1994   Family History  Problem Relation Age of Onset   Arthritis Mother    Alcohol abuse Sister    Cancer Neg Hx    Diabetes Neg Hx    Stroke Neg Hx    Heart disease Neg Hx    Social History  Socioeconomic History   Marital status: Divorced    Spouse name: Not on file   Number of children: 1   Years of education: 61   Highest education level: Not on file  Occupational History   Occupation: Retired  Tobacco Use   Smoking status: Every Day    Packs/day: 0.50    Years: 33.00    Total pack years: 16.50    Types: Cigarettes   Smokeless tobacco: Never   Tobacco comments:    smoked 1983- present , up to 1/3 ppd  Vaping Use   Vaping Use: Never used  Substance and Sexual Activity   Alcohol use: Yes   Drug use: No   Sexual activity: Yes  Other Topics Concern   Not on file   Social History Narrative   Regular exercise-no   Caffeine Use-yes   Social Determinants of Health   Financial Resource Strain: Low Risk  (01/09/2022)   Overall Financial Resource Strain (CARDIA)    Difficulty of Paying Living Expenses: Not hard at all  Food Insecurity: No Food Insecurity (01/09/2022)   Hunger Vital Sign    Worried About Running Out of Food in the Last Year: Never true    [place] in the Last Year: Never true  Transportation Needs: No Transportation Needs (01/09/2022)   PRAPARE - Hydrologist (Medical): No    Lack of Transportation (Non-Medical): No  Physical Activity: Sufficiently Active (01/09/2022)   Exercise Vital Sign    Days of Exercise per Week: 5 days    Minutes of Exercise per Session: 30 min  Stress: No Stress Concern Present (01/09/2022)   [name]    Feeling of Stress : Not at all  Social Connections: [place] (01/09/2022)   Social Connection and Isolation Panel [NHANES]    Frequency of Communication with Friends and Family: More than three times a week    Frequency of Social Gatherings with Friends and Family: More than three times a week    Attends Religious Services: More than 4 times per year    Active Member of [employer] or Organizations: Yes    Attends Music therapist: More than 4 times per year    Marital Status: Married    Tobacco Counseling Ready to quit: Not Answered Counseling given: Not Answered Tobacco comments: smoked 1983- present , up to 1/3 ppd   Clinical Intake:  Pre-visit preparation completed: Yes  Pain : No/denies pain     BMI - recorded: 31.28 (10/29/2021) Nutritional Status: BMI > 30  Obese Nutritional Risks: None Diabetes: No  How often do you need to have someone help you when you read instructions, pamphlets, or other written materials from your doctor or pharmacy?: 1 - Never What is the  last grade level you completed in school?: HSG  Diabetic? no  Interpreter Needed?: No  Information entered by :: Lisette Abu, LPN.   Activities of Daily Living    01/09/2022   10:11 AM  In your present state of health, do you have any difficulty performing the following activities:  Hearing? 0  Vision? 0  Difficulty concentrating or making decisions? 0  Walking or climbing stairs? 0  Dressing or bathing? 0  Doing errands, shopping? 0  Preparing Food and eating ? N  Using the Toilet? N  In the past six months, have you accidently leaked urine? N  Do you have problems with loss of bowel  control? N  Managing your Medications? N  Managing your Finances? N  Housekeeping or managing your Housekeeping? N    Patient Care Team: Hoyt Koch, MD as PCP - General (Internal Medicine)  Indicate any recent Medical Services you may have received from other than [facility] providers in the past year (date may be approximate).     Assessment:   This is a routine wellness examination for Kharizma.  Hearing/Vision screen Hearing Screening - Comments:: Denies hearing difficulties   Vision Screening - Comments:: Wears rx glasses - up to date with routine eye exams with [facility]   Dietary issues and exercise activities discussed: Current Exercise Habits: Home exercise routine, Type of exercise: walking, Time (Minutes): 30, Frequency (Times/Week): 5, Weekly Exercise (Minutes/Week): 150, Exercise limited by: orthopedic condition(s);Other - see comments (stress fx in foot)   Goals Addressed             This Visit's Progress    Client understands the importance of follow-up with providers by attending scheduled visits        Depression Screen    01/09/2022   10:10 AM 10/08/2021    9:12 AM 01/08/2021   10:12 AM 09/05/2019    3:07 PM 01/18/2017    2:40 PM 06/29/2012   10:57 AM  PHQ 2/9 Scores  PHQ - 2 Score 0 0 0 0 1 0  PHQ- 9 Score  0        Fall Risk     01/09/2022   10:07 AM 10/08/2021    9:12 AM 06/29/2012   10:57 AM  [place] in the past year? 0 0 No  Number falls in past yr: 0 0   Injury with Fall? 0 0   Risk for fall due to : No Fall Risks    Follow up Falls prevention discussed      [place]:  Any stairs in or around the home? No  If so, are there any without handrails? No  Home free of loose throw rugs in walkways, pet beds, electrical cords, etc? Yes  Adequate lighting in your home to reduce risk of falls? Yes   ASSISTIVE DEVICES UTILIZED TO PREVENT FALLS:  Life alert? No  Use of a cane, walker or w/c? No  Grab bars in the bathroom? No  Shower chair or bench in shower? Yes  Elevated toilet seat or a handicapped toilet? No   TIMED UP AND GO:  Was the test performed? No . Phone Visit   Cognitive Function:        01/09/2022   10:10 AM  6CIT Screen  What Year? 0 points  What month? 0 points  What time? 0 points  Count back from 20 0 points  Months in reverse 0 points  Repeat phrase 0 points  Total Score 0 points    Immunizations Immunization History  Administered Date(s) Administered   Influenza Inj Mdck Quad With Preservative 03/19/2017   Influenza,inj,Quad PF,6+ Mos 01/09/2014, 01/11/2015, 01/17/2016, 01/25/2018   Influenza-Unspecified 12/22/2011   Tdap 06/29/2012    TDAP status: Up to date  Flu Vaccine status: Declined, Education has been provided regarding the importance of this vaccine but patient still declined. Advised may receive this vaccine at local pharmacy or Health Dept. Aware to provide a copy of the vaccination record if obtained from local pharmacy or Health Dept. Verbalized acceptance and understanding.  Pneumococcal vaccine status: Declined,  Education has been provided regarding the  importance of this vaccine but patient still declined. Advised may receive this vaccine at local pharmacy or Health Dept. Aware to provide a copy of the  vaccination record if obtained from local pharmacy or Health Dept. Verbalized acceptance and understanding.   Covid-19 vaccine status: Declined, Education has been provided regarding the importance of this vaccine but patient still declined. Advised may receive this vaccine at local pharmacy or Health Dept.or vaccine clinic. Aware to provide a copy of the vaccination record if obtained from local pharmacy or Health Dept. Verbalized acceptance and understanding.  Qualifies for Shingles Vaccine? Yes   Zostavax completed No   Shingrix Completed?: No.    Education has been provided regarding the importance of this vaccine. Patient has been advised to call insurance company to determine out of pocket expense if they have not yet received this vaccine. Advised may also receive vaccine at local pharmacy or Health Dept. Verbalized acceptance and understanding.  Screening Tests Health Maintenance  Topic Date Due   COVID-19 Vaccine (1) Never done   HIV Screening  Never done   Zoster Vaccines- Shingrix (1 of 2) Never done   MAMMOGRAM  08/30/2015   PAP SMEAR-Modifier  06/20/2017   INFLUENZA VACCINE  10/21/2021   TETANUS/TDAP  06/30/2022   Fecal DNA (Cologuard)  05/22/2023   Hepatitis C Screening  Completed   HPV VACCINES  Aged Out   COLONOSCOPY (Pts 45-26yr Insurance coverage will need to be confirmed)  Discontinued    Health Maintenance  Health Maintenance Due  Topic Date Due   COVID-19 Vaccine (1) Never done   HIV Screening  Never done   Zoster Vaccines- Shingrix (1 of 2) Never done   MAMMOGRAM  08/30/2015   PAP SMEAR-Modifier  06/20/2017   INFLUENZA VACCINE  10/21/2021    Colorectal cancer screening: Type of screening: Cologuard. Completed 05/22/2020. Repeat every 3 years  Mammogram status: Completed 04/04/2021. Repeat every year  Bone Density status; never done  Lung Cancer Screening: (Low Dose CT Chest recommended if Age 63-80years, 30 pack-year currently smoking OR have quit w/in  15years.) does not qualify.   Lung Cancer Screening Referral: no  Additional Screening:  Hepatitis C Screening: does qualify; Completed 01/17/2016  Vision Screening: Recommended annual ophthalmology exams for early detection of glaucoma and other disorders of the eye. Is the patient up to date with their annual eye exam?  Yes  Who is the provider or what is the name of the office in which the patient attends annual eye exams[place] is not established with a provider, would they like to be referred to a provider to establish care? No .   Dental Screening: Recommended annual dental exams for proper oral hygiene  Community Resource Referral / Chronic Care Management: CRR required this visit?  No   CCM required this visit?  No      Plan:     I have personally reviewed and noted the following in the patient's chart:   Medical and social history Use of alcohol, tobacco or illicit drugs  Current medications and supplements including opioid prescriptions. Patient is not currently taking opioid prescriptions. Functional ability and status Nutritional status Physical activity Advanced directives List of other physicians Hospitalizations, surgeries, and ER visits in previous 12 months Vitals Screenings to include cognitive, depression, and falls Referrals and appointments  In addition, I have reviewed and discussed with patient certain preventive protocols, quality metrics, and best practice recommendations. A written personalized care plan for  preventive services as well as general preventive health recommendations were provided to patient.     Sheral Flow, LPN   [id number]   Nurse Notes: N/A

## 2022-01-22 ENCOUNTER — Ambulatory Visit: Payer: Medicare HMO | Admitting: Podiatrist

## 2022-01-22 ENCOUNTER — Encounter: Payer: Self-pay | Admitting: Podiatrist

## 2022-01-22 ENCOUNTER — Ambulatory Visit (INDEPENDENT_AMBULATORY_CARE_PROVIDER_SITE_OTHER): Payer: Medicare HMO

## 2022-01-22 DIAGNOSIS — M79672 Pain in left foot: Secondary | ICD-10-CM | POA: Diagnosis not present

## 2022-01-22 DIAGNOSIS — S92355D Nondisplaced fracture of fifth metatarsal bone, left foot, subsequent encounter for fracture with routine healing: Secondary | ICD-10-CM

## 2022-01-22 NOTE — Progress Notes (Signed)
   Chief Complaint  Patient presents with   Foot Injury     4 week f/u     HPI: Patient is 61 y.o. female who presents today for follow up of a fracture of the fifth metatarsal of the left foot. She relates she still has pain in the foot especially at night when she gets up and doesn't have the boot on. Overall she is wearing the boot at all times when up and on her foot during the day and states she has noticed improvement in the pain on the outside of the foot.    Allergies  Allergen Reactions   Bee Venom Anaphylaxis    Review of systems is negative except as noted in the HPI.  Denies nausea/ vomiting/ fevers/ chills or night sweats.   Denies difficulty breathing, denies calf pain or tenderness  Physical Exam  Patient is awake, alert, and oriented x 3.  In no acute distress.    Vascular status is intact with palpable pedal pulses DP and PT bilateral and capillary refill time less than 3 seconds bilateral.  No edema or erythema noted.   Neurological exam reveals epicritic and protective sensation grossly intact bilateral.   Dermatological exam reveals skin is supple and dry to bilateral feet.  No open lesions present.    Musculoskeletal exam: some continued pain along the lateral foot at the fifth metatarsal shaft is noted. Swelling is deceased.   Xrays taken at the end of the visit today show a well healing fracture site of the fifth metatarsal shaft. Callus is forming around the fracture site and the fracture itself appears to be filling in.  No shifting of fracture fragments noted,  well healing foot noted.   Assessment:   ICD-10-CM   1. Closed nondisplaced fracture of fifth metatarsal bone of left foot with routine healing, subsequent encounter  S92.355D DG Foot Complete Left       Plan: Discussed exam findings and she does appear to be improved. Xrays taken show consolidation around the fracture site.  She will continue to wear the boot at all times for another 4 weeks.  I will see her back in 4 weeks and xrays to be taken prior to the visit so I can guide her use of the boot.  If any questions arise she will call.

## 2022-02-19 ENCOUNTER — Ambulatory Visit (INDEPENDENT_AMBULATORY_CARE_PROVIDER_SITE_OTHER): Payer: Medicare HMO

## 2022-02-19 ENCOUNTER — Encounter: Payer: Self-pay | Admitting: Podiatrist

## 2022-02-19 ENCOUNTER — Ambulatory Visit: Payer: Medicare HMO | Admitting: Podiatrist

## 2022-02-19 DIAGNOSIS — S92355D Nondisplaced fracture of fifth metatarsal bone, left foot, subsequent encounter for fracture with routine healing: Secondary | ICD-10-CM

## 2022-02-19 DIAGNOSIS — S92355A Nondisplaced fracture of fifth metatarsal bone, left foot, initial encounter for closed fracture: Secondary | ICD-10-CM | POA: Diagnosis not present

## 2022-02-19 NOTE — Progress Notes (Signed)
No chief complaint on file.    HPI: Patient is 61 y.o. female who presents today for follow-up of fifth metatarsal fracture of the left foot.  She states she been doing well she is wearing her boot as instructed and notices some discomfort when she is out of the boot at home at times.  Overall she states she is doing well and improved on her left foot.   Allergies  Allergen Reactions   Bee Venom Anaphylaxis    Review of systems is negative except as noted in the HPI.  Denies nausea/ vomiting/ fevers/ chills or night sweats.   Denies difficulty breathing, denies calf pain or tenderness  Physical Exam  Patient is awake, alert, and oriented x 3.  In no acute distress.    Vascular status is intact with palpable pedal pulses DP and PT bilateral and capillary refill time less than 3 seconds bilateral.  No edema or erythema noted.   Neurological exam reveals epicritic and protective sensation grossly intact bilateral.   Dermatological exam reveals skin is supple and dry to bilateral feet.  No open lesions present.    Musculoskeletal exam: Some residual tenderness to palpation at the proximal third of the fifth metatarsal is noted.  No swelling is present.  X-ray evaluation 3 views of the left foot are obtained and compared to the last x-rays.  The fracture site itself looks healed with bone callus present especially the plantar aspect of the fracture.  Overall much improved radiographically  Assessment:   ICD-10-CM   1. Closed nondisplaced fracture of fifth metatarsal bone of left foot with routine healing, subsequent encounter  S92.355D DG Foot Complete Left       Plan: Discussed exam and x-ray findings with [name].  Discussed that the x-ray shows signs of healing and that there is good bone callus over the area of the fracture site.  Discussed that she may discontinue the use of the air fracture walker and I would recommend that she slowly wean out of it.  She is to still use the boot if  she is going to be doing a lot of walking.  If any increased pain or swelling occurs, she will go back in the air fracture walker and call our office, otherwise she will wean out of the boot and will be seen back in the future as needed.

## 2022-02-24 ENCOUNTER — Encounter: Payer: Self-pay | Admitting: Family Medicine

## 2022-02-24 ENCOUNTER — Ambulatory Visit (INDEPENDENT_AMBULATORY_CARE_PROVIDER_SITE_OTHER)
Admission: RE | Admit: 2022-02-24 | Discharge: 2022-02-24 | Disposition: A | Discharge status: Home / Self Care | Payer: Medicare HMO | Source: Ambulatory Visit | Attending: Family Medicine | Admitting: Family Medicine

## 2022-02-24 ENCOUNTER — Telehealth: Payer: Self-pay | Admitting: Internal Medicine

## 2022-02-24 ENCOUNTER — Ambulatory Visit (INDEPENDENT_AMBULATORY_CARE_PROVIDER_SITE_OTHER): Payer: Medicare HMO | Admitting: Family Medicine

## 2022-02-24 VITALS — BP 136/70 | HR 77 | Temp 98.6°F | Ht 65.0 in | Wt 204.0 lb

## 2022-02-24 DIAGNOSIS — M79645 Pain in left finger(s): Secondary | ICD-10-CM

## 2022-02-24 DIAGNOSIS — M19032 Primary osteoarthritis, left wrist: Secondary | ICD-10-CM | POA: Diagnosis not present

## 2022-02-24 DIAGNOSIS — M189 Osteoarthritis of first carpometacarpal joint, unspecified: Secondary | ICD-10-CM | POA: Diagnosis not present

## 2022-02-24 MED ORDER — NAPROXEN 500 MG PO TABS: 500.0000 mg | ORAL_TABLET | Freq: Two times a day (BID) | ORAL | 0 refills | Status: DC

## 2022-02-24 MED ORDER — TRAMADOL HCL 50 MG PO TABS: 50.0000 mg | ORAL_TABLET | Freq: Three times a day (TID) | ORAL | 0 refills | Status: AC | PRN

## 2022-02-24 NOTE — Progress Notes (Signed)
Subjective  CC:  Chief Complaint  Patient presents with   Hand Pain    Pt stated that she has been having some sharp left hand pain that started last night 02/24/2022   Same day acute visit; PCP not available. New pt to me. Chart reviewed.   HPI: [name] is a 61 y.o. female who presents to the office today to address the problems listed above in the chief complaint. 61 yo c/o sudden onset of left hand pain: base of thumb. Moderate to severe pain even with slight movement. No trauma. Had similar pain years back (2018) and saw Dr. 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 Since has had intermittent similar episodes, however, never as severe as today. No h/o gout. No fevers. No redness.    Assessment  1. Thumb pain, left   2. Localized primary osteoarthritis of carpometacarpal (CMC) joint of left wrist      Plan  Acute 1st mcp pain, left:  suspect DJD flare/related, however gout is in differential. Will check xrays again and treat with nsaids (ibuprofecn 600 tid) and ice. Tramadol if needed for pain, and then supportive splint that she has at home. If worsening, consider gout eval/pred treatment. F/u with pcp if needed  Follow up: prn  Visit date not found  Orders Placed This Encounter  Procedures   DG Hand Complete Left   Meds ordered this encounter  Medications   traMADol (ULTRAM) 50 MG tablet    Sig: Take 1 tablet (50 mg total) by mouth every 8 (eight) hours as needed for up to 5 days.    Dispense:  15 tablet    Refill:  0      I reviewed the patients updated PMH, FH, and SocHx.    Patient Active Problem List   Diagnosis Date Noted   Left foot pain 10/10/2021   Numbness and tingling of foot 10/10/2021   Pre-diabetes 01/08/2021   Atypical mole 01/08/2021   Osteoarthritis of spine with radiculopathy, lumbar region 02/25/2017   Constipation 04/01/2016   Routine general  medical examination at a health care facility 01/09/2014   Tobacco use disorder 01/09/2014   Hypothyroidism 10/25/2013   Genital herpes 06/29/2012   Current Meds  Medication Sig   EPINEPHrine (EPIPEN 2-PAK) 0.3 mg/0.3 mL IJ SOAJ injection Inject 0.3 mg into the muscle as needed for anaphylaxis.   traMADol (ULTRAM) 50 MG tablet Take 1 tablet (50 mg total) by mouth every 8 (eight) hours as needed for up to 5 days.   VITAMIN D, CHOLECALCIFEROL, PO Take 1,000 Units by mouth daily.    Allergies: Patient is allergic to bee venom. Family History: Patient family history includes Alcohol abuse in her sister; Arthritis in her mother. Social History:  Patient  reports that she has been smoking cigarettes. She has a 16.50 pack-year smoking history. She has never used smokeless tobacco. She reports current alcohol use. She reports that she does not use drugs.  Review of Systems: Constitutional: Negative for fever malaise or anorexia Cardiovascular: negative for chest pain Respiratory: negative for SOB or persistent cough Gastrointestinal: negative for abdominal pain  Objective  Vitals: BP 136/70   Pulse 77   Temp 98.6 F (37 C)   Ht '5\' 5"'$  (1.651 m)   Wt 204 lb (92.5 kg)   SpO2 98%   BMI 33.95 kg/m  Left hand: normal appearing,  no swelling of joints. + ttp 1st MCP, pain with slight movement.  Nl wrist exam    Commons side effects, risks, benefits, and alternatives for medications and treatment plan prescribed today were discussed, and the patient expressed understanding of the given instructions. Patient is instructed to call or message via MyChart if he/she has any questions or concerns regarding our treatment plan. No barriers to understanding were identified. We discussed Red Flag symptoms and signs in detail. Patient expressed understanding regarding what to do in case of urgent or emergency type symptoms.  Medication list was reconciled, printed and provided to the patient in AVS.  Patient instructions and summary information was reviewed with the patient as documented in the AVS. This note was prepared with assistance of Dragon voice recognition software. Occasional wrong-word or sound-a-like substitutions may have occurred due to the inherent limitations of voice recognition software  This visit occurred during the SARS-CoV-2 public health emergency.  Safety protocols were in place, including screening questions prior to the visit, additional usage of staff PPE, and extensive cleaning of exam room while observing appropriate contact time as indicated for disinfecting solutions.

## 2022-02-24 NOTE — Patient Instructions (Signed)
Please follow up if symptoms do not improve or as needed.    Use ibuprofen with meals and ice the thumb area that is affected.  You may use the splint once the pain has improved.  Use tramadol as needed for severe pain.   Please go to our [facility] office to get your xrays done. You can walk in M-F between 8:30am- noon or 1pm - 5pm. Tell them you are there for xrays ordered by me. They will send me the results, then I will let you know the results with instructions.   Address: [address].  The Xray department is located in the basement.

## 2022-02-24 NOTE — Telephone Encounter (Signed)
Patient states Pharmacy received RX for Tramadol which is not picking up because it does not work.  Patient requests to be contacted regarding why a  RX for an anti-inflammatory was not sent to Pharmacy

## 2022-02-25 NOTE — Telephone Encounter (Signed)
Message sent to notify the pt that naprosyn has been sent in

## 2022-03-11 ENCOUNTER — Encounter: Payer: Self-pay | Admitting: Internal Medicine

## 2022-03-11 ENCOUNTER — Ambulatory Visit (INDEPENDENT_AMBULATORY_CARE_PROVIDER_SITE_OTHER): Payer: Medicare HMO | Admitting: Internal Medicine

## 2022-03-11 VITALS — BP 120/80 | HR 61 | Temp 98.3°F | Ht 65.0 in | Wt 202.0 lb

## 2022-03-11 DIAGNOSIS — R7303 Prediabetes: Secondary | ICD-10-CM | POA: Diagnosis not present

## 2022-03-11 DIAGNOSIS — F17200 Nicotine dependence, unspecified, uncomplicated: Secondary | ICD-10-CM

## 2022-03-11 DIAGNOSIS — Z0000 Encounter for general adult medical examination without abnormal findings: Secondary | ICD-10-CM | POA: Diagnosis not present

## 2022-03-11 MED ORDER — BUPROPION HCL ER (XL) 150 MG PO TB24: 150.0000 mg | ORAL_TABLET | Freq: Every day | ORAL | 6 refills | Status: DC

## 2022-03-11 MED ORDER — EPINEPHRINE 0.3 MG/0.3ML IJ SOAJ: 0.3000 mg | INTRAMUSCULAR | 0 refills | Status: DC | PRN

## 2022-03-11 MED ORDER — DICLOFENAC SODIUM 1 % EX GEL: 2.0000 g | Freq: Four times a day (QID) | CUTANEOUS | 3 refills | Status: DC

## 2022-03-11 NOTE — Progress Notes (Unsigned)
   Subjective:   Patient ID: [name], female    DOB: 04/06/1960, 61 y.o.   MRN: [id number]  HPI The patient is here for physical.  PMH, [facility], social history reviewed and updated  Review of Systems  Objective:  Physical Exam  Vitals:   03/11/22 0939  BP: 120/80  Pulse: 61  Temp: 98.3 F (36.8 C)  TempSrc: Oral  SpO2: 98%  Weight: 202 lb (91.6 kg)  Height: '5\' 5"'$  (1.651 m)    Assessment & Plan:

## 2022-03-11 NOTE — Patient Instructions (Addendum)
We have sent in wellbutrin to take 1 pill daily for weight and stop smoking.

## 2022-03-12 NOTE — Assessment & Plan Note (Signed)
Flu shot declines. Shingrix declines. Tetanus up to date. Cologuard upto date. Mammogram up to date, pap smear counseled due. Counseled about sun safety and mole surveillance. Counseled about the dangers of distracted driving. Given 10 year screening recommendations.

## 2022-03-12 NOTE — Assessment & Plan Note (Signed)
Counseled to quit 

## 2022-03-12 NOTE — Assessment & Plan Note (Signed)
Not due for repeat labs today. Counseled.

## 2022-03-25 DIAGNOSIS — Z124 Encounter for screening for malignant neoplasm of cervix: Secondary | ICD-10-CM | POA: Diagnosis not present

## 2022-03-25 DIAGNOSIS — Z01419 Encounter for gynecological examination (general) (routine) without abnormal findings: Secondary | ICD-10-CM | POA: Diagnosis not present

## 2022-03-25 DIAGNOSIS — Z1151 Encounter for screening for human papillomavirus (HPV): Secondary | ICD-10-CM | POA: Diagnosis not present

## 2022-03-25 DIAGNOSIS — Z1231 Encounter for screening mammogram for malignant neoplasm of breast: Secondary | ICD-10-CM | POA: Diagnosis not present

## 2022-09-10 ENCOUNTER — Emergency Department (HOSPITAL_COMMUNITY)
Admission: EM | Admit: 2022-09-10 | Discharge: 2022-09-10 | Disposition: A | Discharge status: Home / Self Care | Payer: Medicare HMO | Attending: Emergency Medicine | Admitting: Emergency Medicine

## 2022-09-10 ENCOUNTER — Encounter (HOSPITAL_COMMUNITY): Payer: Self-pay | Admitting: Emergency Medicine

## 2022-09-10 ENCOUNTER — Other Ambulatory Visit: Payer: Self-pay

## 2022-09-10 DIAGNOSIS — T63441A Toxic effect of venom of bees, accidental (unintentional), initial encounter: Secondary | ICD-10-CM | POA: Insufficient documentation

## 2022-09-10 DIAGNOSIS — R000 Tachycardia, unspecified: Secondary | ICD-10-CM | POA: Diagnosis not present

## 2022-09-10 DIAGNOSIS — I959 Hypotension, unspecified: Secondary | ICD-10-CM | POA: Diagnosis not present

## 2022-09-10 DIAGNOSIS — F17200 Nicotine dependence, unspecified, uncomplicated: Secondary | ICD-10-CM | POA: Diagnosis not present

## 2022-09-10 DIAGNOSIS — F319 Bipolar disorder, unspecified: Secondary | ICD-10-CM | POA: Diagnosis not present

## 2022-09-10 MED ORDER — DEXAMETHASONE SODIUM PHOSPHATE 10 MG/ML IJ SOLN
10.0000 mg | Freq: Once | INTRAMUSCULAR | Status: AC
  Administered 2022-09-10: 10 mg via INTRAVENOUS
  Filled 2022-09-10: qty 1

## 2022-09-10 MED ORDER — DIPHENHYDRAMINE HCL 50 MG/ML IJ SOLN
25.0000 mg | Freq: Once | INTRAMUSCULAR | Status: AC
  Administered 2022-09-10: 25 mg via INTRAVENOUS
  Filled 2022-09-10: qty 1

## 2022-09-10 MED ORDER — FAMOTIDINE IN NACL 20-0.9 MG/50ML-% IV SOLN
20.0000 mg | Freq: Once | INTRAVENOUS | Status: AC
  Administered 2022-09-10: 20 mg via INTRAVENOUS
  Filled 2022-09-10: qty 50

## 2022-09-10 NOTE — ED Provider Notes (Signed)
[place] EMERGENCY DEPARTMENT AT [facility] Provider Note   CSN: [id number] Arrival date & time: 09/10/22  1018     History  Chief Complaint  Patient presents with   Allergic Reaction    [name] is a 62 y.o. female presenting from home by EMS with concern for yellowjacket sting.  The patient reports that she was told that she has allergies to bee stings in the past, was advised by her PCP to carry an EpiPen.  Today she was stung twice on the right lower leg by yellowjacket.  She says she immediately administered IM epi at home, because she was "feeling like my throat was getting tight".  She reports she does have anxiety and also smokes, wonders whether this was contributing to her sensation.  She denies hives.  She denies any prior history of compromised airway.  HPI     Home Medications Prior to Admission medications   Medication Sig Start Date End Date Taking? Authorizing Provider  buPROPion (WELLBUTRIN XL) 150 MG 24 hr tablet Take 1 tablet (150 mg total) by mouth daily. 03/11/22   Myrlene Broker, MD  diclofenac Sodium (VOLTAREN) 1 % GEL Apply 2 g topically 4 (four) times daily. 03/11/22   Myrlene Broker, MD  EPINEPHrine (EPIPEN 2-PAK) 0.3 mg/0.3 mL IJ SOAJ injection Inject 0.3 mg into the muscle as needed for anaphylaxis. 03/11/22   Myrlene Broker, MD  naproxen (NAPROSYN) 500 MG tablet Take 1 tablet (500 mg total) by mouth 2 (two) times daily with a meal. Patient not taking: Reported on 03/11/2022 02/24/22   Willow Ora, MD  VITAMIN D, CHOLECALCIFEROL, PO Take 1,000 Units by mouth daily. Patient not taking: Reported on 03/11/2022    [provider]      Allergies    Bee venom    Review of Systems   Review of Systems  Physical Exam Updated Vital Signs BP 115/68   Pulse 79   Temp 98 F (36.7 C) (Oral)   Resp 18   SpO2 98%  Physical Exam Constitutional:      General: She is not in acute distress. HENT:      Head: Normocephalic and atraumatic.  Eyes:     Conjunctiva/sclera: Conjunctivae normal.     Pupils: Pupils are equal, round, and reactive to light.  Cardiovascular:     Rate and Rhythm: Normal rate and regular rhythm.  Pulmonary:     Effort: Pulmonary effort is normal. No respiratory distress.     Comments: Oropharynx non-erythematous.  No tonsillar swelling or exudate.  No uvular deviation.  No drooling. No brawny edema. No stridor. Voice is not muffled. Skin:    General: Skin is warm and dry.     Comments: 2 punctate sting marks noted to the right lower extremity, no hives, no wheals  Neurological:     General: No focal deficit present.     Mental Status: She is alert. Mental status is at baseline.  Psychiatric:        Mood and Affect: Mood normal.        Behavior: Behavior normal.     ED Results / Procedures / Treatments   Labs (all labs ordered are listed, but only abnormal results are displayed) Labs Reviewed - No data to display  EKG None  Radiology No results found.  Procedures Procedures    Medications Ordered in ED Medications  diphenhydrAMINE (BENADRYL) injection 25 mg (25 mg Intravenous Given 09/10/22 1100)  dexamethasone (  DECADRON) injection 10 mg (10 mg Intravenous Given 09/10/22 1059)  famotidine (PEPCID) IVPB 20 mg premix (0 mg Intravenous Stopped 09/10/22 1127)    ED Course/ Medical Decision Making/ A&P Clinical Course as of 09/10/22 1746  Thu Sep 10, 2022  1237 Patient reassessed and remained stable, BP 147/7mmhg, no new hives or welts, airway exam benign.  She is stable for discharge [MT]  1238 She has additional EpiPen at home if she needs it.  Also Benadryl at home. [MT]    Clinical Course User Index [MT] Audra Bellard, Kermit Balo, MD                             Medical Decision Making Risk Prescription drug management.   Patient is presenting to the ED after bee sting at home.  She self-administered epinephrine already.  I do not see  evidence of airway compromise or anaphylaxis currently on exam.  However, she has medicated before arrival in the ED, at this point I think it is reasonable to complete an allergic reaction course of medications with IV steroids, Pepcid and Benadryl.  If she remains stable and well-appearing after period of observation, would not dissipate discharge home.  Supplemental history was provided by EMS.        Final Clinical Impression(s) / ED Diagnoses Final diagnoses:  Bee sting, accidental or unintentional, initial encounter    Rx / DC Orders ED Discharge Orders     None         Xayvier Vallez, Kermit Balo, MD 09/10/22 1747

## 2022-09-10 NOTE — ED Triage Notes (Signed)
Pt BIB EMS from home, c/o allergy to bee sting. Pt endorsed being stung by 2 yellow jackets today on left leg. Self administered epi pen x1. Received 50 mg Benadryl IM en route.   BP 135/80 P 120 spO2 95%

## 2022-09-12 ENCOUNTER — Other Ambulatory Visit: Payer: Self-pay | Admitting: Internal Medicine

## 2022-09-14 MED ORDER — EPINEPHRINE 0.3 MG/0.3ML IJ SOAJ: 0.3000 mg | INTRAMUSCULAR | 0 refills | Status: DC | PRN

## 2022-09-17 ENCOUNTER — Telehealth: Payer: Self-pay

## 2022-09-17 NOTE — Telephone Encounter (Signed)
Transition Care Management Unsuccessful Follow-up Telephone Call  Date of discharge and from where:  09/10/2022 [facility]  Attempts:  1st Attempt  Reason for unsuccessful TCM follow-up call:  Left voice message  [name] [facility]  [facility] Population Health Community Resource Care Guide   [email or web address]  ?? [phone]   Website: [email or web address]  [email or web address]

## 2022-09-18 ENCOUNTER — Telehealth: Payer: Self-pay

## 2022-09-18 NOTE — Telephone Encounter (Signed)
Transition Care Management Unsuccessful Follow-up Telephone Call  Date of discharge and from where:  09/10/2022 [facility]  Attempts:  2nd Attempt  Reason for unsuccessful TCM follow-up call:  Left voice message  Criston Chancellor [facility]  [facility] Population Health Community Resource Care Guide   [email or web address]  ?? [phone]   Website: [email or web address]  [email or web address]

## 2023-03-26 ENCOUNTER — Encounter: Payer: Self-pay | Admitting: Internal Medicine

## 2023-03-26 ENCOUNTER — Ambulatory Visit (INDEPENDENT_AMBULATORY_CARE_PROVIDER_SITE_OTHER): Payer: Medicare HMO | Admitting: Internal Medicine

## 2023-03-26 VITALS — BP 140/100 | HR 81 | Temp 98.2°F | Ht 65.0 in | Wt 205.0 lb

## 2023-03-26 DIAGNOSIS — R7303 Prediabetes: Secondary | ICD-10-CM

## 2023-03-26 DIAGNOSIS — Z0000 Encounter for general adult medical examination without abnormal findings: Secondary | ICD-10-CM | POA: Diagnosis not present

## 2023-03-26 DIAGNOSIS — E039 Hypothyroidism, unspecified: Secondary | ICD-10-CM | POA: Diagnosis not present

## 2023-03-26 DIAGNOSIS — F17200 Nicotine dependence, unspecified, uncomplicated: Secondary | ICD-10-CM

## 2023-03-26 LAB — COMPREHENSIVE METABOLIC PANEL
ALT: 10 U/L (ref 0–35)
AST: 15 U/L (ref 0–37)
Albumin: 4.1 g/dL (ref 3.5–5.2)
Alkaline Phosphatase: 119 U/L — ABNORMAL HIGH (ref 39–117)
BUN: 15 mg/dL (ref 6–23)
CO2: 26 meq/L (ref 19–32)
Calcium: 9.1 mg/dL (ref 8.4–10.5)
Chloride: 106 meq/L (ref 96–112)
Creatinine, Ser: 0.57 mg/dL (ref 0.40–1.20)
GFR: 96.19 mL/min (ref >60.00)
Glucose, Bld: 92 mg/dL (ref 70–99)
Potassium: 3.4 meq/L — ABNORMAL LOW (ref 3.5–5.1)
Sodium: 141 meq/L (ref 135–145)
Total Bilirubin: 0.4 mg/dL (ref 0.2–1.2)
Total Protein: 6.9 g/dL (ref 6.0–8.3)

## 2023-03-26 LAB — CBC
HCT: 41.3 % (ref 36.0–46.0)
Hemoglobin: 13.7 g/dL (ref 12.0–15.0)
MCHC: 33.1 g/dL (ref 30.0–36.0)
MCV: 90.1 fL (ref 78.0–100.0)
Platelets: 303 10*3/uL (ref 150.0–400.0)
RBC: 4.58 Mil/uL (ref 3.87–5.11)
RDW: 14.1 % (ref 11.5–15.5)
WBC: 8.2 10*3/uL (ref 4.0–10.5)

## 2023-03-26 LAB — LIPID PANEL
Cholesterol: 253 mg/dL — ABNORMAL HIGH (ref 0–200)
HDL: 50.6 mg/dL (ref >39.00)
LDL Cholesterol: 174 mg/dL — ABNORMAL HIGH (ref 0–99)
NonHDL: 202.2
Total CHOL/HDL Ratio: 5
Triglycerides: 141 mg/dL (ref 0.0–149.0)
VLDL: 28.2 mg/dL (ref 0.0–40.0)

## 2023-03-26 LAB — TSH: TSH: 2.63 u[IU]/mL (ref 0.35–5.50)

## 2023-03-26 LAB — HEMOGLOBIN A1C: Hgb A1c MFr Bld: 5.6 % (ref 4.6–6.5)

## 2023-03-26 MED ORDER — BUPROPION HCL ER (XL) 150 MG PO TB24: 150.0000 mg | ORAL_TABLET | Freq: Every day | ORAL | 3 refills | Status: DC

## 2023-03-26 MED ORDER — DICLOFENAC SODIUM 1 % EX GEL: 2.0000 g | Freq: Four times a day (QID) | CUTANEOUS | 3 refills | Status: AC

## 2023-03-26 MED ORDER — EPINEPHRINE 0.3 MG/0.3ML IJ SOAJ: 0.3000 mg | INTRAMUSCULAR | 0 refills | Status: AC | PRN

## 2023-03-26 NOTE — Patient Instructions (Signed)
 Think about wegovy and zepbound for weight loss check with the insurance

## 2023-03-26 NOTE — Assessment & Plan Note (Signed)
 Checking TSH and adjust as needed not on meds currently.

## 2023-03-26 NOTE — Assessment & Plan Note (Signed)
 Counseled to quit

## 2023-03-26 NOTE — Assessment & Plan Note (Signed)
 Checking HgA1c and adjust as needed.

## 2023-03-26 NOTE — Progress Notes (Signed)
   Subjective:   Patient ID: [name], female    DOB: 25-Dec-1960, 63 y.o.   MRN: [id number]  HPI The patient is here for physical.  PMH, [facility], social history reviewed and updated  Review of Systems  Constitutional: Negative.   HENT: Negative.    Eyes: Negative.   Respiratory:  Negative for cough, chest tightness and shortness of breath.   Cardiovascular:  Negative for chest pain, palpitations and leg swelling.  Gastrointestinal:  Negative for abdominal distention, abdominal pain, constipation, diarrhea, nausea and vomiting.  Musculoskeletal: Negative.   Skin: Negative.   Neurological: Negative.   Psychiatric/Behavioral: Negative.      Objective:  Physical Exam Constitutional:      Appearance: She is well-developed.  HENT:     Head: Normocephalic and atraumatic.  Cardiovascular:     Rate and Rhythm: Normal rate and regular rhythm.  Pulmonary:     Effort: Pulmonary effort is normal. No respiratory distress.     Breath sounds: Normal breath sounds. No wheezing or rales.  Abdominal:     General: Bowel sounds are normal. There is no distension.     Palpations: Abdomen is soft.     Tenderness: There is no abdominal tenderness. There is no rebound.  Musculoskeletal:     Cervical back: Normal range of motion.  Skin:    General: Skin is warm and dry.  Neurological:     Mental Status: She is alert and oriented to person, place, and time.     Coordination: Coordination normal.     Vitals:   03/26/23 0852 03/26/23 0903  BP: (!) 140/100 (!) 140/100  Pulse: 81   Temp: 98.2 F (36.8 C)   TempSrc: Oral   SpO2: 98%   Weight: 205 lb (93 kg)   Height: 5' 5 (1.651 m)     Assessment & Plan:

## 2023-03-26 NOTE — Assessment & Plan Note (Addendum)
 Flu shot declines. Shingrix declines. Tetanus declines. Cologuard due 2025. Mammogram declines, pap smear up to date. Counseled about sun safety and mole surveillance. Counseled about the dangers of distracted driving. Given 10 year screening recommendations.

## 2023-06-23 ENCOUNTER — Ambulatory Visit (INDEPENDENT_AMBULATORY_CARE_PROVIDER_SITE_OTHER): Payer: Medicare HMO

## 2023-06-23 VITALS — Ht 65.0 in | Wt 198.0 lb

## 2023-06-23 DIAGNOSIS — Z1212 Encounter for screening for malignant neoplasm of rectum: Secondary | ICD-10-CM | POA: Diagnosis not present

## 2023-06-23 DIAGNOSIS — Z0000 Encounter for general adult medical examination without abnormal findings: Secondary | ICD-10-CM | POA: Diagnosis not present

## 2023-06-23 DIAGNOSIS — Z1211 Encounter for screening for malignant neoplasm of colon: Secondary | ICD-10-CM

## 2023-06-23 NOTE — Patient Instructions (Addendum)
 [name] , Thank you for taking time to come for your Medicare Wellness Visit. I appreciate your ongoing commitment to your health goals. Please review the following plan we discussed and let me know if I can assist you in the future.   Referrals/Orders/Follow-Ups/Clinician Recommendations: It was nice talking to you today.  You are due a mammogram and a Cologuard.   I have put a request in for you to receive a Cologuard kit, which will come to your resident.  Aim for 30 minutes of exercise or brisk walking, 6-8 glasses of water, and 5 servings of fruits and vegetables each day.   This is a list of the screening recommended for you and due dates:  Health Maintenance  Topic Date Due   Pneumococcal Vaccination (1 of 2 - PCV) Never done   HIV Screening  Never done   Zoster (Shingles) Vaccine (1 of 2) Never done   Mammogram  08/30/2015   Pap with HPV screening  06/20/2017   DTaP/Tdap/Td vaccine (2 - Td or Tdap) 06/30/2022   COVID-19 Vaccine (1 - 2024-25 season) Never done   Cologuard (Stool DNA test)  05/22/2023   Flu Shot  10/22/2023   Medicare Annual Wellness Visit  06/22/2024   Hepatitis C Screening  Completed   HPV Vaccine  Aged Out   Colon Cancer Screening  Discontinued    Advanced directives: (Declined) Advance directive discussed with you today. Even though you declined this today, please call our office should you change your mind, and we can give you the proper paperwork for you to fill out.  Next Medicare Annual Wellness Visit scheduled for next year: Yes

## 2023-06-23 NOTE — Progress Notes (Signed)
 Subjective:   ADRI SCHLOSS is a 63 y.o. who presents for a Medicare Wellness preventive visit.  Visit Complete: Virtual I connected with  Lamarr Lulas on 06/23/23 by a audio enabled telemedicine application and verified that I am speaking with the correct person using two identifiers.  Patient Location: Home  Provider Location: Home Office  I discussed the limitations of evaluation and management by telemedicine. The patient expressed understanding and agreed to proceed.  Vital Signs: Because this visit was a virtual/telehealth visit, some criteria may be missing or patient reported. Any vitals not documented were not able to be obtained and vitals that have been documented are patient reported.  VideoDeclined- This patient declined Librarian, academic. Therefore the visit was completed with audio only.  Persons Participating in Visit: Patient.  AWV Questionnaire: No: Patient Medicare AWV questionnaire was not completed prior to this visit.  Cardiac Risk Factors include: advanced age (>53men, >57 women);smoking/ tobacco exposure     Objective:    Today's Vitals   06/23/23 0802 06/23/23 0814  Weight: 198 lb (89.8 kg)   Height: 5\' 5"  (1.651 m)   PainSc:  4    Body mass index is 32.95 kg/m.     06/23/2023    8:23 AM 01/09/2022   10:06 AM 02/26/2020    7:44 PM 03/11/2017   12:59 PM 09/04/2015    8:58 AM 09/01/2015    7:36 PM 12/19/2014    6:28 AM  Advanced Directives  Does Patient Have a Medical Advance Directive? No No No No No No No  Would patient like information on creating a medical advance directive?  No - Patient declined No - Patient declined No - Patient declined No - patient declined information  No - patient declined information    Current Medications (verified) Outpatient Encounter Medications as of 06/23/2023  Medication Sig   diclofenac Sodium (VOLTAREN) 1 % GEL Apply 2 g topically 4 (four) times daily.   EPINEPHrine (EPIPEN  2-PAK) 0.3 mg/0.3 mL IJ SOAJ injection Inject 0.3 mg into the muscle as needed for anaphylaxis.   ibuprofen (ADVIL) 200 MG tablet Take 200 mg by mouth every 6 (six) hours as needed.   buPROPion (WELLBUTRIN XL) 150 MG 24 hr tablet Take 1 tablet (150 mg total) by mouth daily. (Patient not taking: Reported on 06/23/2023)   No facility-administered encounter medications on file as of 06/23/2023.    Allergies (verified) Bee venom   History: Past Medical History:  Diagnosis Date   Anxiety    Arthritis    Back pain    Bipolar affective disorder (HCC)    Depression    Genital herpes    History of chicken pox    Hypothyroidism    Past Surgical History:  Procedure Laterality Date   CESAREAN SECTION  1993   COLONOSCOPY  2004   neg   HEMORRHOID SURGERY  2006   LAPAROSCOPIC SALPINGO OOPHERECTOMY Left 12/19/2014   Procedure: LAPAROSCOPIC LEFT SALPINGO OOPHORECTOMY;  Surgeon: Lavina Hamman, MD;  Location: [facility];  Service: Gynecology;  Laterality: Left;   LAPAROSCOPY N/A 12/19/2014   Procedure: LAPAROSCOPY OPERATIVE;  Surgeon: Lavina Hamman, MD;  Location: [facility];  Service: Gynecology;  Laterality: N/A;   TONSILLECTOMY  1981   TUBAL LIGATION  1994   Family History  Problem Relation Age of Onset   Arthritis Mother    Alcohol abuse Sister    Cancer Neg Hx    Diabetes Neg Hx    Stroke  Neg Hx    Heart disease Neg Hx    Social History   Socioeconomic History   Marital status: Divorced    Spouse name: Not on file   Number of children: 1   Years of education: 12   Highest education level: Not on file  Occupational History   Occupation: Retired  Tobacco Use   Smoking status: Every Day    Current packs/day: 0.50    Average packs/day: 0.5 packs/day for 33.0 years (16.5 ttl pk-yrs)    Types: Cigarettes   Smokeless tobacco: Never   Tobacco comments:    smoked 1983- present , up to 1/3 ppd  Vaping Use   Vaping status: Never Used  Substance and Sexual Activity   Alcohol use: Yes    Drug use: No   Sexual activity: Yes  Other Topics Concern   Not on file  Social History Narrative   Regular exercise-noCaffeine Use-yes      Lives alone with 2 dogs.   Social Drivers of Health   Financial Resource Strain: Medium Risk (06/23/2023)   Overall Financial Resource Strain (CARDIA)    Difficulty of Paying Living Expenses: Somewhat hard  Food Insecurity: No Food Insecurity (06/23/2023)   Hunger Vital Sign    Worried About Running Out of Food in the Last Year: Never true    Ran Out of Food in the Last Year: Never true  Transportation Needs: No Transportation Needs (06/23/2023)   PRAPARE - Administrator, Civil Service (Medical): No    Lack of Transportation (Non-Medical): No  Physical Activity: Inactive (06/23/2023)   Exercise Vital Sign    Days of Exercise per Week: 0 days    Minutes of Exercise per Session: 0 min  Stress: Stress Concern Present (06/23/2023)   [employer] of Occupational Health - Occupational Stress Questionnaire    Feeling of Stress : Rather much  Social Connections: Moderately Integrated (06/23/2023)   Social Connection and Isolation Panel [NHANES]    Frequency of Communication with Friends and Family: More than three times a week    Frequency of Social Gatherings with Friends and Family: More than three times a week    Attends Religious Services: More than 4 times per year    Active Member of [employer] or Organizations: Yes    Attends Banker Meetings: Never    Marital Status: Divorced    Tobacco Counseling Ready to quit: Not Answered Counseling given: Not Answered Tobacco comments: smoked 1983- present , up to 1/3 ppd    Clinical Intake:  Pre-visit preparation completed: Yes  Pain : 0-10 Pain Score: 4  Pain Type: Acute pain Pain Location: Leg (feet/cramps) Pain Radiating Towards: legs down to feet Pain Descriptors / Indicators: Cramping Pain Onset: 1 to 4 weeks ago Pain Frequency: Intermittent Pain Relieving Factors:  ibprofen Effect of Pain on Daily Activities: laying down/starts at night  Pain Relieving Factors: ibprofen  BMI - recorded: 32.95 Nutritional Status: BMI > 30  Obese Nutritional Risks: None Diabetes: No  Lab Results  Component Value Date   HGBA1C 5.6 03/26/2023   HGBA1C 5.8 10/08/2021   HGBA1C 5.4 01/08/2021     How often do you need to have someone help you when you read instructions, pamphlets, or other written materials from your doctor or pharmacy?: 1 - Never  Interpreter Needed?: No  Information entered by :: Wells Gerdeman, RMA   Activities of Daily Living     06/23/2023    8:23 AM  In  your present state of health, do you have any difficulty performing the following activities:  Hearing? 0  Vision? 0  Difficulty concentrating or making decisions? 0  Walking or climbing stairs? 0  Dressing or bathing? 0  Doing errands, shopping? 0  Preparing Food and eating ? N  Using the Toilet? N  In the past six months, have you accidently leaked urine? N  Do you have problems with loss of bowel control? N  Managing your Medications? N  Managing your Finances? N  Housekeeping or managing your Housekeeping? N    Patient Care Team: Myrlene Broker, MD as PCP - General (Internal Medicine)  Indicate any recent Medical Services you may have received from other than [facility] providers in the past year (date may be approximate).     Assessment:   This is a routine wellness examination for Adriona.  Hearing/Vision screen Hearing Screening - Comments:: Denies hearing difficulties   Vision Screening - Comments:: Wears eyeglasses   Goals Addressed   None    Depression Screen    06/23/2023    8:27 AM 03/26/2023    9:04 AM 03/11/2022    9:52 AM 03/11/2022    9:49 AM 02/24/2022   10:58 AM 01/09/2022   10:10 AM 10/08/2021    9:12 AM  PHQ 2/9 Scores  PHQ - 2 Score 2 0 0 0 0 0 0  PHQ- 9 Score 3 0 0 0   0    Fall Risk     06/23/2023    8:23 AM 03/26/2023    9:04 AM 03/11/2022     9:49 AM 02/24/2022   10:58 AM 01/09/2022   10:07 AM  Fall Risk   Falls in the past year? 1 0 0 0 0  Number falls in past yr: 0 0 0 0 0  Injury with Fall? 0 0 0 0 0  Risk for fall due to : No Fall Risks   No Fall Risks No Fall Risks  Follow up Falls prevention discussed;Falls evaluation completed Falls evaluation completed Falls evaluation completed Falls evaluation completed Falls prevention discussed    MEDICARE RISK AT HOME:  Medicare Risk at Home Any stairs in or around the home?: Yes If so, are there any without handrails?: Yes Home free of loose throw rugs in walkways, pet beds, electrical cords, etc?: Yes Adequate lighting in your home to reduce risk of falls?: Yes Life alert?: No Use of a cane, walker or w/c?: No Grab bars in the bathroom?: No Shower chair or bench in shower?: No Elevated toilet seat or a handicapped toilet?: No  TIMED UP AND GO:  Was the test performed?  No  Cognitive Function: Declined: Patient declined cognitive screening, but was able to answer questions in an accurate and timely manner. No cognitive impairments observed.        01/09/2022   10:10 AM  6CIT Screen  What Year? 0 points  What month? 0 points  What time? 0 points  Count back from 20 0 points  Months in reverse 0 points  Repeat phrase 0 points  Total Score 0 points    Immunizations Immunization History  Administered Date(s) Administered   Influenza Inj Mdck Quad With Preservative 03/19/2017   Influenza,inj,Quad PF,6+ Mos 01/09/2014, 01/11/2015, 01/17/2016, 01/25/2018   Influenza-Unspecified 12/22/2011   Tdap 06/29/2012    Screening Tests Health Maintenance  Topic Date Due   Pneumococcal Vaccine 37-46 Years old (1 of 2 - PCV) Never done   HIV Screening  Never done   Zoster Vaccines- Shingrix (1 of 2) Never done   MAMMOGRAM  08/30/2015   Cervical Cancer Screening (HPV/Pap Cotest)  06/20/2017   DTaP/Tdap/Td (2 - Td or Tdap) 06/30/2022   COVID-19 Vaccine (1 - 2024-25  season) Never done   Fecal DNA (Cologuard)  05/22/2023   INFLUENZA VACCINE  10/22/2023   Medicare Annual Wellness (AWV)  06/22/2024   Hepatitis C Screening  Completed   HPV VACCINES  Aged Out   Colonoscopy  Discontinued    Health Maintenance  Health Maintenance Due  Topic Date Due   Pneumococcal Vaccine 64-32 Years old (1 of 2 - PCV) Never done   HIV Screening  Never done   Zoster Vaccines- Shingrix (1 of 2) Never done   MAMMOGRAM  08/30/2015   Cervical Cancer Screening (HPV/Pap Cotest)  06/20/2017   DTaP/Tdap/Td (2 - Td or Tdap) 06/30/2022   COVID-19 Vaccine (1 - 2024-25 season) Never done   Fecal DNA (Cologuard)  05/22/2023   Health Maintenance Items Addressed: Cologuard Ordered, See Nurse Notes  Additional Screening:  Vision Screening: Recommended annual ophthalmology exams for early detection of glaucoma and other disorders of the eye.  Dental Screening: Recommended annual dental exams for proper oral hygiene  Community Resource Referral / Chronic Care Management: CRR required this visit?  No   CCM required this visit?  No     Plan:     I have personally reviewed and noted the following in the patient's chart:   Medical and social history Use of alcohol, tobacco or illicit drugs  Current medications and supplements including opioid prescriptions. Patient is not currently taking opioid prescriptions. Functional ability and status Nutritional status Physical activity Advanced directives List of other physicians Hospitalizations, surgeries, and ER visits in previous 12 months Vitals Screenings to include cognitive, depression, and falls Referrals and appointments  In addition, I have reviewed and discussed with patient certain preventive protocols, quality metrics, and best practice recommendations. A written personalized care plan for preventive services as well as general preventive health recommendations were provided to patient.     Chaston Bradburn L Willard Madrigal,  CMA   06/23/2023   After Visit Summary: (MyChart) Due to this being a telephonic visit, the after visit summary with patients personalized plan was offered to patient via MyChart   Notes: Please refer to Routing Comments.

## 2023-07-14 DIAGNOSIS — Z1231 Encounter for screening mammogram for malignant neoplasm of breast: Secondary | ICD-10-CM | POA: Diagnosis not present

## 2023-07-14 LAB — HM MAMMOGRAPHY

## 2023-09-01 ENCOUNTER — Other Ambulatory Visit (HOSPITAL_COMMUNITY)

## 2023-09-01 ENCOUNTER — Emergency Department (HOSPITAL_COMMUNITY)

## 2023-09-01 ENCOUNTER — Observation Stay (HOSPITAL_COMMUNITY)
Admission: EM | Admit: 2023-09-01 | Discharge: 2023-09-02 | Disposition: A | Discharge status: Home / Self Care | Attending: Family Medicine | Admitting: Family Medicine

## 2023-09-01 ENCOUNTER — Inpatient Hospital Stay (HOSPITAL_COMMUNITY)

## 2023-09-01 ENCOUNTER — Encounter (HOSPITAL_COMMUNITY): Payer: Self-pay

## 2023-09-01 ENCOUNTER — Other Ambulatory Visit: Payer: Self-pay

## 2023-09-01 DIAGNOSIS — I10 Essential (primary) hypertension: Secondary | ICD-10-CM | POA: Diagnosis present

## 2023-09-01 DIAGNOSIS — Z8673 Personal history of transient ischemic attack (TIA), and cerebral infarction without residual deficits: Secondary | ICD-10-CM | POA: Diagnosis present

## 2023-09-01 DIAGNOSIS — E66811 Obesity, class 1: Secondary | ICD-10-CM | POA: Diagnosis not present

## 2023-09-01 DIAGNOSIS — Z7901 Long term (current) use of anticoagulants: Secondary | ICD-10-CM | POA: Diagnosis not present

## 2023-09-01 DIAGNOSIS — I6381 Other cerebral infarction due to occlusion or stenosis of small artery: Secondary | ICD-10-CM | POA: Diagnosis not present

## 2023-09-01 DIAGNOSIS — Z79899 Other long term (current) drug therapy: Secondary | ICD-10-CM | POA: Insufficient documentation

## 2023-09-01 DIAGNOSIS — Z720 Tobacco use: Secondary | ICD-10-CM | POA: Diagnosis present

## 2023-09-01 DIAGNOSIS — R000 Tachycardia, unspecified: Secondary | ICD-10-CM | POA: Diagnosis not present

## 2023-09-01 DIAGNOSIS — R030 Elevated blood-pressure reading, without diagnosis of hypertension: Secondary | ICD-10-CM | POA: Diagnosis not present

## 2023-09-01 DIAGNOSIS — F17210 Nicotine dependence, cigarettes, uncomplicated: Secondary | ICD-10-CM | POA: Insufficient documentation

## 2023-09-01 DIAGNOSIS — G4489 Other headache syndrome: Secondary | ICD-10-CM | POA: Diagnosis not present

## 2023-09-01 DIAGNOSIS — Z6832 Body mass index (BMI) 32.0-32.9, adult: Secondary | ICD-10-CM | POA: Insufficient documentation

## 2023-09-01 DIAGNOSIS — R4781 Slurred speech: Secondary | ICD-10-CM | POA: Diagnosis present

## 2023-09-01 DIAGNOSIS — R221 Localized swelling, mass and lump, neck: Secondary | ICD-10-CM | POA: Diagnosis not present

## 2023-09-01 DIAGNOSIS — I639 Cerebral infarction, unspecified: Secondary | ICD-10-CM | POA: Diagnosis not present

## 2023-09-01 DIAGNOSIS — R531 Weakness: Secondary | ICD-10-CM | POA: Diagnosis not present

## 2023-09-01 DIAGNOSIS — F319 Bipolar disorder, unspecified: Secondary | ICD-10-CM | POA: Diagnosis present

## 2023-09-01 DIAGNOSIS — R29810 Facial weakness: Secondary | ICD-10-CM | POA: Diagnosis not present

## 2023-09-01 DIAGNOSIS — I6389 Other cerebral infarction: Secondary | ICD-10-CM | POA: Diagnosis not present

## 2023-09-01 DIAGNOSIS — I6522 Occlusion and stenosis of left carotid artery: Secondary | ICD-10-CM | POA: Diagnosis not present

## 2023-09-01 DIAGNOSIS — R29818 Other symptoms and signs involving the nervous system: Secondary | ICD-10-CM | POA: Diagnosis not present

## 2023-09-01 DIAGNOSIS — E039 Hypothyroidism, unspecified: Secondary | ICD-10-CM | POA: Insufficient documentation

## 2023-09-01 DIAGNOSIS — R29704 NIHSS score 4: Secondary | ICD-10-CM

## 2023-09-01 DIAGNOSIS — Q048 Other specified congenital malformations of brain: Secondary | ICD-10-CM | POA: Diagnosis not present

## 2023-09-01 DIAGNOSIS — I63532 Cerebral infarction due to unspecified occlusion or stenosis of left posterior cerebral artery: Principal | ICD-10-CM | POA: Insufficient documentation

## 2023-09-01 DIAGNOSIS — F310 Bipolar disorder, current episode hypomanic: Secondary | ICD-10-CM | POA: Diagnosis not present

## 2023-09-01 DIAGNOSIS — E041 Nontoxic single thyroid nodule: Secondary | ICD-10-CM | POA: Insufficient documentation

## 2023-09-01 DIAGNOSIS — F17200 Nicotine dependence, unspecified, uncomplicated: Secondary | ICD-10-CM | POA: Diagnosis present

## 2023-09-01 DIAGNOSIS — F1090 Alcohol use, unspecified, uncomplicated: Secondary | ICD-10-CM | POA: Insufficient documentation

## 2023-09-01 DIAGNOSIS — R41841 Cognitive communication deficit: Secondary | ICD-10-CM | POA: Diagnosis present

## 2023-09-01 LAB — ECHOCARDIOGRAM COMPLETE
AR max vel: 2.43 cm2
AV Area VTI: 2.76 cm2
AV Area mean vel: 2.5 cm2
AV Mean grad: 6 mmHg
AV Peak grad: 10.8 mmHg
Ao pk vel: 1.64 m/s
Area-P 1/2: 3.23 cm2
Height: 65 in
S' Lateral: 2.8 cm
Weight: 3167.57 [oz_av]

## 2023-09-01 LAB — LIPID PANEL
Cholesterol: 253 mg/dL — ABNORMAL HIGH (ref 0–200)
HDL: 42 mg/dL (ref >40)
LDL Cholesterol: 188 mg/dL — ABNORMAL HIGH (ref 0–99)
Total CHOL/HDL Ratio: 6 ratio
Triglycerides: 113 mg/dL (ref <150)
VLDL: 23 mg/dL (ref 0–40)

## 2023-09-01 LAB — TSH: TSH: 3.274 u[IU]/mL (ref 0.350–4.500)

## 2023-09-01 LAB — DIFFERENTIAL
Abs Immature Granulocytes: 0.01 10*3/uL (ref 0.00–0.07)
Basophils Absolute: 0 10*3/uL (ref 0.0–0.1)
Basophils Relative: 1 %
Eosinophils Absolute: 0.3 10*3/uL (ref 0.0–0.5)
Eosinophils Relative: 4 %
Immature Granulocytes: 0 %
Lymphocytes Relative: 36 %
Lymphs Abs: 3.2 10*3/uL (ref 0.7–4.0)
Monocytes Absolute: 0.7 10*3/uL (ref 0.1–1.0)
Monocytes Relative: 8 %
Neutro Abs: 4.6 10*3/uL (ref 1.7–7.7)
Neutrophils Relative %: 51 %

## 2023-09-01 LAB — ETHANOL: Alcohol, Ethyl (B): <15 mg/dL (ref <15)

## 2023-09-01 LAB — COMPREHENSIVE METABOLIC PANEL WITH GFR
ALT: 11 U/L (ref 0–44)
AST: 19 U/L (ref 15–41)
Albumin: 3.6 g/dL (ref 3.5–5.0)
Alkaline Phosphatase: 98 U/L (ref 38–126)
Anion gap: 10 (ref 5–15)
BUN: 15 mg/dL (ref 8–23)
CO2: 21 mmol/L — ABNORMAL LOW (ref 22–32)
Calcium: 9.1 mg/dL (ref 8.9–10.3)
Chloride: 108 mmol/L (ref 98–111)
Creatinine, Ser: 0.83 mg/dL (ref 0.44–1.00)
GFR, Estimated: >60 mL/min (ref >60)
Glucose, Bld: 116 mg/dL — ABNORMAL HIGH (ref 70–99)
Potassium: 3.4 mmol/L — ABNORMAL LOW (ref 3.5–5.1)
Sodium: 139 mmol/L (ref 135–145)
Total Bilirubin: 0.7 mg/dL (ref 0.0–1.2)
Total Protein: 7.7 g/dL (ref 6.5–8.1)

## 2023-09-01 LAB — RAPID URINE DRUG SCREEN, HOSP PERFORMED
Amphetamines: NOT DETECTED
Barbiturates: NOT DETECTED
Benzodiazepines: NOT DETECTED
Cocaine: NOT DETECTED
Opiates: NOT DETECTED
Tetrahydrocannabinol: NOT DETECTED

## 2023-09-01 LAB — CBC
HCT: 41.3 % (ref 36.0–46.0)
Hemoglobin: 13.6 g/dL (ref 12.0–15.0)
MCH: 29.6 pg (ref 26.0–34.0)
MCHC: 32.9 g/dL (ref 30.0–36.0)
MCV: 90 fL (ref 80.0–100.0)
Platelets: 316 10*3/uL (ref 150–400)
RBC: 4.59 MIL/uL (ref 3.87–5.11)
RDW: 13.5 % (ref 11.5–15.5)
WBC: 8.9 10*3/uL (ref 4.0–10.5)
nRBC: 0 % (ref 0.0–0.2)

## 2023-09-01 LAB — I-STAT CHEM 8, ED
BUN: 16 mg/dL (ref 8–23)
Calcium, Ion: 1.09 mmol/L — ABNORMAL LOW (ref 1.15–1.40)
Chloride: 109 mmol/L (ref 98–111)
Creatinine, Ser: 0.7 mg/dL (ref 0.44–1.00)
Glucose, Bld: 116 mg/dL — ABNORMAL HIGH (ref 70–99)
HCT: 41 % (ref 36.0–46.0)
Hemoglobin: 13.9 g/dL (ref 12.0–15.0)
Potassium: 3.6 mmol/L (ref 3.5–5.1)
Sodium: 141 mmol/L (ref 135–145)
TCO2: 19 mmol/L — ABNORMAL LOW (ref 22–32)

## 2023-09-01 LAB — APTT: aPTT: 23 s — ABNORMAL LOW (ref 24–36)

## 2023-09-01 LAB — PROTIME-INR
INR: 1 (ref 0.8–1.2)
Prothrombin Time: 13.1 s (ref 11.4–15.2)

## 2023-09-01 LAB — HEMOGLOBIN A1C
Hgb A1c MFr Bld: 5.2 % (ref 4.8–5.6)
Mean Plasma Glucose: 102.54 mg/dL

## 2023-09-01 LAB — CBG MONITORING, ED: Glucose-Capillary: 128 mg/dL — ABNORMAL HIGH (ref 70–99)

## 2023-09-01 MED ORDER — ATORVASTATIN CALCIUM 80 MG PO TABS
80.0000 mg | ORAL_TABLET | Freq: Every day | ORAL | Status: DC
  Administered 2023-09-02: 80 mg via ORAL
  Filled 2023-09-01: qty 1

## 2023-09-01 MED ORDER — CLOPIDOGREL BISULFATE 75 MG PO TABS
75.0000 mg | ORAL_TABLET | Freq: Every day | ORAL | Status: DC
  Administered 2023-09-02: 75 mg via ORAL
  Filled 2023-09-01: qty 1

## 2023-09-01 MED ORDER — ACETAMINOPHEN 160 MG/5ML PO SOLN: 650.0000 mg | ORAL | Status: DC | PRN

## 2023-09-01 MED ORDER — ASPIRIN 300 MG RE SUPP: 300.0000 mg | Freq: Once | RECTAL | Status: AC

## 2023-09-01 MED ORDER — SODIUM CHLORIDE 0.9 % IV SOLN: INTRAVENOUS | Status: AC

## 2023-09-01 MED ORDER — ENOXAPARIN SODIUM 40 MG/0.4ML IJ SOSY
40.0000 mg | PREFILLED_SYRINGE | INTRAMUSCULAR | Status: DC
  Administered 2023-09-01: 40 mg via SUBCUTANEOUS
  Filled 2023-09-01 (×2): qty 0.4

## 2023-09-01 MED ORDER — SENNOSIDES-DOCUSATE SODIUM 8.6-50 MG PO TABS: 1.0000 | ORAL_TABLET | Freq: Every evening | ORAL | Status: DC | PRN

## 2023-09-01 MED ORDER — STROKE: EARLY STAGES OF RECOVERY BOOK
Freq: Once | Status: AC
  Filled 2023-09-01: qty 1

## 2023-09-01 MED ORDER — HYDRALAZINE HCL 20 MG/ML IJ SOLN: 10.0000 mg | INTRAMUSCULAR | Status: DC | PRN

## 2023-09-01 MED ORDER — ASPIRIN 81 MG PO TBEC
81.0000 mg | DELAYED_RELEASE_TABLET | Freq: Every day | ORAL | Status: DC
  Administered 2023-09-02: 81 mg via ORAL
  Filled 2023-09-01: qty 1

## 2023-09-01 MED ORDER — ACETAMINOPHEN 325 MG PO TABS
650.0000 mg | ORAL_TABLET | ORAL | Status: DC | PRN
Start: 2023-09-01 — End: 2023-09-02
  Administered 2023-09-01 – 2023-09-02 (×2): 650 mg via ORAL
  Filled 2023-09-01 (×2): qty 2

## 2023-09-01 MED ORDER — IOHEXOL 350 MG/ML SOLN
100.0000 mL | Freq: Once | INTRAVENOUS | Status: AC | PRN
  Administered 2023-09-01: 100 mL via INTRAVENOUS

## 2023-09-01 MED ORDER — CLOPIDOGREL BISULFATE 300 MG PO TABS
300.0000 mg | ORAL_TABLET | Freq: Once | ORAL | Status: AC
  Administered 2023-09-01: 300 mg via ORAL
  Filled 2023-09-01: qty 1

## 2023-09-01 MED ORDER — ACETAMINOPHEN 650 MG RE SUPP: 650.0000 mg | RECTAL | Status: DC | PRN

## 2023-09-01 MED ORDER — DICLOFENAC SODIUM 1 % EX GEL: 2.0000 g | Freq: Four times a day (QID) | CUTANEOUS | Status: DC | PRN

## 2023-09-01 MED ORDER — DICLOFENAC SODIUM 1 % EX GEL: 2.0000 g | Freq: Four times a day (QID) | CUTANEOUS | Status: DC

## 2023-09-01 MED ORDER — SODIUM CHLORIDE 0.9% FLUSH
3.0000 mL | Freq: Once | INTRAVENOUS | Status: AC
  Administered 2023-09-01: 3 mL via INTRAVENOUS

## 2023-09-01 MED ORDER — ASPIRIN 325 MG PO TABS
325.0000 mg | ORAL_TABLET | Freq: Once | ORAL | Status: AC
  Administered 2023-09-01: 325 mg via ORAL
  Filled 2023-09-01: qty 1

## 2023-09-01 NOTE — ED Notes (Signed)
 CCMD contacted

## 2023-09-01 NOTE — Discharge Instructions (Signed)

## 2023-09-01 NOTE — Consult Note (Signed)
 NEUROLOGY CONSULT NOTE   Date of service: September 01, 2023 Patient Name: [name] MRN:  [id number] DOB:  1960-11-02 Chief Complaint: Code Stroke Requesting Provider: Jerilynn Montenegro, MD  History of Present Illness  [name] is a 63 y.o. female with hx of anxiety, bipolar, migraines, depression, and hypothyroidism. She got home from work last night and noted a headache, it improved and then around 8 pm she developed a more severe headache and went to bed. She woke up this morning with continued headache pain and went to her neighbor's house who noted right sided weakness and slurred speech. She currently works part time and does spend about 5 hrs per day standing. She takes ibuprofen daily for headache and back pain (usually 800mg  daily). She is not currently taking any other medications. She smokes 0.5 ppd, and has been smoking for approximately 40 years. She does have a family history of stroke, her mom died in 04-21-23 from a stroke.   LKW: 2000 Modified rankin score: 0-Completely asymptomatic and back to baseline post- stroke IV Thrombolysis: No, outside of window EVT: No, no LVO Note: CTA was delayed due to difficulty obtaining IV access  NIHSS components Score: Comment  1a Level of Conscious 0[x]  1[]  2[]  3[]      1b LOC Questions 0[x]  1[]  2[]       1c LOC Commands 0[x]  1[]  2[]       2 Best Gaze 0[x]  1[]  2[]       3 Visual 0[x]  1[]  2[]  3[]      4 Facial Palsy 0[x]  1[]  2[]  3[]      5a Motor Arm - left 0[x]  1[]  2[]  3[]  4[]  UN[]    5b Motor Arm - Right 0[]  1[x]  2[]  3[]  4[]  UN[]    6a Motor Leg - Left 0[x]  1[]  2[]  3[]  4[]  UN[]    6b Motor Leg - Right 0[x]  1[]  2[]  3[]  4[]  UN[]    7 Limb Ataxia 0[]  1[]  2[x]  UN[]      8 Sensory 0[x]  1[]  2[]  UN[]      9 Best Language 0[x]  1[]  2[]  3[]      10 Dysarthria 0[]  1[x]  2[]  UN[]      11 Extinct. and Inattention 0[x]  1[]  2[]       TOTAL:  4       ROS  Comprehensive ROS performed and pertinent positives documented in HPI   Past History    Past Medical History:  Diagnosis Date   Anxiety    Arthritis    Back pain    Bipolar affective disorder (HCC)    Depression    Genital herpes    History of chicken pox    Hypothyroidism     Past Surgical History:  Procedure Laterality Date   CESAREAN SECTION  1993   COLONOSCOPY  2004   neg   HEMORRHOID SURGERY  2006   LAPAROSCOPIC SALPINGO OOPHERECTOMY Left 12/19/2014   Procedure: LAPAROSCOPIC LEFT SALPINGO OOPHORECTOMY;  Surgeon: Cyd Dowse, MD;  Location: [facility];  Service: Gynecology;  Laterality: Left;   LAPAROSCOPY N/A 12/19/2014   Procedure: LAPAROSCOPY OPERATIVE;  Surgeon: Cyd Dowse, MD;  Location: [facility];  Service: Gynecology;  Laterality: N/A;   TONSILLECTOMY  1981   TUBAL LIGATION  1994    Family History: Family History  Problem Relation Age of Onset   Arthritis Mother    Alcohol abuse Sister    Cancer Neg Hx    Diabetes Neg Hx    Stroke Neg Hx    Heart disease Neg Hx  Social History  reports that she has been smoking cigarettes. She has a 16.5 pack-year smoking history. She has never used smokeless tobacco. She reports current alcohol use. She reports that she does not use drugs.  Allergies  Allergen Reactions   Bee Venom Anaphylaxis    Medications   Current Facility-Administered Medications:    sodium chloride  flush (NS) 0.9 % injection 3 mL, 3 mL, Intravenous, Once, Jerilynn Montenegro, MD  Current Outpatient Medications:    buPROPion  (WELLBUTRIN  XL) 150 MG 24 hr tablet, Take 1 tablet (150 mg total) by mouth daily. (Patient not taking: Reported on 06/23/2023), Disp: 90 tablet, Rfl: 3   diclofenac  Sodium (VOLTAREN ) 1 % GEL, Apply 2 g topically 4 (four) times daily., Disp: 100 g, Rfl: 3   EPINEPHrine  (EPIPEN  2-PAK) 0.3 mg/0.3 mL IJ SOAJ injection, Inject 0.3 mg into the muscle as needed for anaphylaxis., Disp: 1 each, Rfl: 0   ibuprofen (ADVIL) 200 MG tablet, Take 200 mg by mouth every 6 (six) hours as needed., Disp: , Rfl:   Vitals    Vitals:   2023/09/16 0700  Weight: 89.8 kg    Body mass index is 32.94 kg/m.   Physical Exam   Constitutional: Appears well-developed and well-nourished.  Psych: Affect appropriate to situation.  Eyes: No scleral injection.  HENT: No OP obstruction.  Head: Normocephalic.  Cardiovascular: Normal rate and regular rhythm.  Respiratory: Effort normal, non-labored breathing.  GI: Soft.  No distension. There is no tenderness.  Skin: WDI.   Neurologic Examination   Mental Status: Patient is awake, alert, oriented to person, place, month, year, and situation. Patient is able to give a clear and coherent history. Speech is dysarthric  Cranial Nerves: II: Visual Fields are full. Pupils are equal, round, and reactive to light.   III,IV, VI: EOMI without ptosis or diplopia.  V: Facial sensation is symmetric to temperature VII: Facial movement is symmetric resting and smiling VIII: Hearing is intact to voice X: Palate elevates symmetrically XI: Shoulder shrug is symmetric. XII: Tongue protrudes midline without atrophy or fasciculations.  Motor: Tone is normal. Bulk is normal.  LUE and LLE 5/5 RUE: 4/5 strength. Drift noted in right upper extremity.  RLE: 4+/5 strength proximally. No drift noted.  Sensory: Sensation is symmetric to light touch and temperature in the arms and legs. Reflexes: Normoactive. Toes downgoing bilaterally Cerebellar: Ataxia in right upper and lower extremity  Gait: Deferred   Labs/Imaging/Neurodiagnostic studies   CBC:  Recent Labs  Lab 16-Sep-2023 0743 09/16/2023 0745  WBC 8.9  --   NEUTROABS 4.6  --   HGB 13.6 13.9  HCT 41.3 41.0  MCV 90.0  --   PLT 316  --    Basic Metabolic Panel:  Lab Results  Component Value Date   NA 141 2023/09/16   K 3.6 September 16, 2023   CO2 26 03/26/2023   GLUCOSE 116 (H) September 16, 2023   BUN 16 16-Sep-2023   CREATININE 0.70 Sep 16, 2023   CALCIUM 9.1 03/26/2023   GFRNONAA >60 02/26/2020   GFRAA 33 (L) 08/25/2013    Lipid Panel:  Lab Results  Component Value Date   LDLCALC 174 (H) 03/26/2023   HgbA1c:  Lab Results  Component Value Date   HGBA1C 5.6 03/26/2023   Urine Drug Screen:     Component Value Date/Time   LABOPIA NONE DETECTED 01/12/2011 1042   COCAINSCRNUR NONE DETECTED 01/12/2011 1042   LABBENZ POSITIVE (A) 01/12/2011 1042   AMPHETMU NONE DETECTED 01/12/2011 1042   THCU NONE DETECTED  01/12/2011 1042   LABBARB NONE DETECTED 01/12/2011 1042      CT Head without contrast (Personally reviewed): 1. No acute cortically based infarct or intracranial hemorrhage identified. ASPECTS 10. 2. Subtle asymmetric left frontal lobe white matter hypodensity, age indeterminate.  CT angio Head and Neck with contrast (Personally reviewed): Common carotid and internal carotid arteries patent within the neck without stenosis. Mild atherosclerotic plaque bilaterally, as described. Intracranial atherosclerotic disease as described. Most notably, there is a moderate-to-severe stenosis within a right PCA branch at the P2/P3 junction.  CT Perfusion: Core 0; Penumbra 0  MR Brain (Personally reviewed): 1. 9 mm acute infarct within the posterior limb of left internal capsule. 2. Background T2 FLAIR hyperintense signal changes within the cerebral white matter, nonspecific but most often secondary to chronic small vessel ischemia. 3. 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 4. Mild-to-moderate ethmoid sinus mucosal thickening, bilaterally.   ASSESSMENT  HAZELEE HARBOLD is a 63 y.o. female presenting via EMS from home as a code stroke. LKW at 2000 6/10. She went to bed at 2000 with a headache that was different from her normal headaches. She woke up this morning with headache, slurred speech, and right sided weakness. BP per EMS was 220/palpable.  - Exam reveals dysarthria, right sided weakness and right hemiataxia.  - Imaging studies as above. MRI reveals a 9 mm  acute infarct within the posterior limb of the left internal capsule. 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 Underlying etiology is most likely chronic small vessel disease. She may have undiagnosed underlying hypertension.    RECOMMENDATIONS  - HgbA1c, fasting lipid panel - Frequent neuro checks - Permissive HTN x 24 hours. Treat SBP if > 180.  - Echocardiogram - Load with ASA 300 mg PR or 325 mg PO and Plavix 300 mg. Starting tomorrow, continue with ASA 81 and Plavix 75 mg daily  - Statin. Obtain baseline CK level.  - Risk factor modification - Telemetry monitoring - PT consult, OT consult, Speech consult - Stroke team to follow   ______________________________________________________________________    Signed, Imogene Mana, NP [facility]  I have seen and examined the patient. I have formulated the assessment and recommendations. 63 y.o. female presenting via EMS from home with acute onset of slurred speech and right sided weakness. BP per EMS was 220/palpable. Exam reveals dysarthria, right sided weakness and right hemiataxia. MRI reveals a 9 mm acute infarct within the posterior limb of the left internal capsule. Recommendations as above.  Electronically signed: Dr. Armando Lauman

## 2023-09-01 NOTE — Code Documentation (Signed)
 Stroke Response Nurse Documentation Code Documentation  [name] is a 63 y.o. female arriving to [facility]  via [facility] on 09/01/2023 with past medical hx of anxiety, bipolar, migraines, depression, hypothyroidism. On No antithrombotic. Code stroke was activated by EMS.   Patient from home where she was LKW at 2000 08/31/2023 and now complaining of slurred speech, right arm weakness and facial droop.Per EMS, patient got home from work yesterday with a headache, it improved but then developed a severe headache at 2000 and went to bed. Patient work up this morning with the same headache and noticed her right arm weakness. She went to her neighbors housew who noted slurred speech and a facial droop.   Stroke team at the bedside on patient arrival. Labs drawn and patient cleared for CT by Dr. Aldean Amass. Patient to CT with team. NIHSS 4, see documentation for details and code stroke times. Patient with right arm weakness, right limb ataxia, and dysarthria  on exam. The following imaging was completed:  CT Head, CTA, CTP, and MRI. Patient is not a candidate for IV Thrombolytic due to out of window for TNK per MD. Patient is not a candidate for IR due to no LVO noted on imaging per MD.   Care Plan: VS/NIHSS q2hr x12hrs, then q4hrs; BP Goal <220/120  Process Delays Noted: IV access  Bedside handoff with ED RN Jacqulyne Maxim and Debbrah Faith.    Selestino Dakin  Stroke Response RN

## 2023-09-01 NOTE — Evaluation (Signed)
 Physical Therapy Evaluation Patient Details Name: [name] MRN: [id number] DOB: May 02, 1960 Today's Date: 09/01/2023  History of Present Illness  Pt is 63 yo presenting to [facility] ED on 6/11 due to headache and R sided weakness. PMH: anxiety, bipolar, migraines, depression, hypothyroidism.  Clinical Impression  Pt is currently presenting as ind with sit to stand, bed mobility and gait. Pt was able to stand on bil LE in SLS for 3 seconds without loss of balance or UE support. Currently pt is presenting at baseline level of functioning and no skilled physical therapy services recommended. Pt will be discharged from skilled physical therapy services at this time; please re-consult if further needs arise.            If plan is discharge home, recommend the following: Other (comment) (help as needed)     Equipment Recommendations None recommended by PT     Functional Status Assessment Patient has not had a recent decline in their functional status     Precautions / Restrictions Precautions Precautions: None Recall of Precautions/Restrictions: Intact Restrictions Weight Bearing Restrictions Per Provider Order: No      Mobility  Bed Mobility Overal bed mobility: Independent    Transfers Overall transfer level: Independent Equipment used: None    Ambulation/Gait Ambulation/Gait assistance: Independent Gait Distance (Feet): 200 Feet Assistive device: None Gait Pattern/deviations: WFL(Within Functional Limits), Step-through pattern Gait velocity: WNL Gait velocity interpretation: >4.37 ft/sec, indicative of normal walking speed      Stairs Stairs:  (Pt demonstrates by 3 second SLS, gait and sit to stand that she can navigate stairs per home set up)          Modified Rankin (Stroke Patients Only) Modified Rankin (Stroke Patients Only) Pre-Morbid Rankin Score: No symptoms Modified Rankin: No significant disability     Balance Overall balance assessment:  Independent         Pertinent Vitals/Pain Pain Assessment Pain Assessment: No/denies pain    Home Living Family/patient expects to be discharged to:: Private residence Living Arrangements: Alone;Other (Comment) (2 dogs 3 cats and turtle) Available Help at Discharge: Available PRN/intermittently;Neighbor;Family (daughter and neighbor) Type of Home: House Home Access: Stairs to enter Entrance Stairs-Rails: Can reach both Entrance Stairs-Number of Steps: 5   Home Layout: One level Home Equipment: None      Prior Function Prior Level of Function : Working/employed;Driving;Independent/Modified Independent             Mobility Comments: ind with out AD ADLs Comments: Pt is ind with finances and all IADL's.     Extremity/Trunk Assessment   Upper Extremity Assessment Upper Extremity Assessment: Overall WFL for tasks assessed    Lower Extremity Assessment Lower Extremity Assessment: Overall WFL for tasks assessed    Cervical / Trunk Assessment Cervical / Trunk Assessment: Normal  Communication   Communication Communication: No apparent difficulties    Cognition Arousal: Alert Behavior During Therapy: WFL for tasks assessed/performed   PT - Cognitive impairments: No apparent impairments   Following commands: Intact       Cueing Cueing Techniques: Verbal cues     General Comments General comments (skin integrity, edema, etc.): no noted skin issues or signs/symptoms of cardiac/respiratory distress during session     Assessment/Plan    PT Assessment Patient does not need any further PT services         PT Goals (Current goals can be found in the Care Plan section)  Acute Rehab PT Goals PT Goal Formulation: All assessment and education  complete, DC therapy            AM-PAC PT 6 Clicks Mobility  Outcome Measure Help needed turning from your back to your side while in a flat bed without using bedrails?: None Help needed moving from lying on your  back to sitting on the side of a flat bed without using bedrails?: None Help needed moving to and from a bed to a chair (including a wheelchair)?: None Help needed standing up from a chair using your arms (e.g., wheelchair or bedside chair)?: None Help needed to walk in hospital room?: None Help needed climbing 3-5 steps with a railing? : None 6 Click Score: 24    End of Session Equipment Utilized During Treatment: Gait belt Activity Tolerance: Patient tolerated treatment well Patient left: in bed;with call bell/phone within reach Nurse Communication: Mobility status      Time: 9147-8295 PT Time Calculation (min) (ACUTE ONLY): 11 min   Charges:   PT Evaluation $PT Eval Low Complexity: 1 Low   PT General Charges $$ ACUTE PT VISIT: 1 Visit         Sloan Duncans, DPT, CLT  Acute Rehabilitation Services Office: [phone] (Secure chat preferred)   Jenice Mitts 09/01/2023, 1:19 PM

## 2023-09-01 NOTE — H&P (Signed)
 History and Physical    Patient: [name] RUE:[id number] DOB: 1960/10/28 DOA: 09/01/2023 DOS: the patient was seen and examined on 09/01/2023 PCP: Adelia Homestead, MD  Patient coming from: Home via  Chief Complaint:  Chief Complaint  Patient presents with   Code Stroke   HPI: [name] is a 63 y.o. female with medical history significant of hypothyroidism, bipolar disorder, anxiety, and depression who presents with right-sided weakness and coordination issues. She is accompanied by her daughter and her daughter's boyfriend.  Patient reports normally working 5 hours standing on her feet and had just off from work yesterday evening when she had developed a headache around 8 PM.  She took ibuprofen and went to sleep.  She woke up feeling well but soon developed a headache. After taking her dogs out, she experienced a sudden loss of control over her right hand and weakness. She also felt as though her right leg was shorter than her left, affecting her gait.  Also noted having difficulty in her speech.  Last night, she had a mild headache before bed, which she did not find unusual as she often experiences headaches after a long day. She slept well through the night, waking up at 6 AM.  She smokes about half a pack of cigarettes a day since she was 63 years old. She does not consume alcohol and only takes ibuprofen for back pain or headaches, which she last took yesterday morning before work.  No chest pain or heart palpitations during the episode of weakness. She has a history of hypothyroidism, but her last test was normal, and she has not been on medication for it for a long time.  In the emergency department patient was seen as a code stroke.  Initial CT scan of the head did not reveal any acute abnormality.  patient was not a candidate for thrombolytics due to being out of the window.  Blood pressures initially elevated up to 186 / 84.  CT angiogram of the head and neck did  not reveal any large vessel occlusion, but did note moderate to severe stenosis within the right PCA branch of P2 and P3.  Not a candidate for IR due to no LVO being present.  Noted to be afebrile with blood pressures elevated up to 186/84.  Labs were relatively unremarkable.  Patient has been given full dose aspirin and started on Plavix.  MRI of the brain revealed a 9 mm acute infarct within the posterior limb of the left internal capsule.  Review of Systems: As mentioned in the history of present illness. All other systems reviewed and are negative. Past Medical History:  Diagnosis Date   Anxiety    Arthritis    Back pain    Bipolar affective disorder (HCC)    Depression    Genital herpes    History of chicken pox    Hypothyroidism    Past Surgical History:  Procedure Laterality Date   CESAREAN SECTION  1993   COLONOSCOPY  2004   neg   HEMORRHOID SURGERY  2006   LAPAROSCOPIC SALPINGO OOPHERECTOMY Left 12/19/2014   Procedure: LAPAROSCOPIC LEFT SALPINGO OOPHORECTOMY;  Surgeon: Cyd Dowse, MD;  Location: [facility];  Service: Gynecology;  Laterality: Left;   LAPAROSCOPY N/A 12/19/2014   Procedure: LAPAROSCOPY OPERATIVE;  Surgeon: Cyd Dowse, MD;  Location: [facility];  Service: Gynecology;  Laterality: N/A;   TONSILLECTOMY  1981   TUBAL LIGATION  1994   Social History:  reports that she  has been smoking cigarettes. She has a 16.5 pack-year smoking history. She has never used smokeless tobacco. She reports current alcohol use. She reports that she does not use drugs.  Allergies  Allergen Reactions   Bee Venom Anaphylaxis    Family History  Problem Relation Age of Onset   Arthritis Mother    Alcohol abuse Sister    Cancer Neg Hx    Diabetes Neg Hx    Stroke Neg Hx    Heart disease Neg Hx     Prior to Admission medications   Medication Sig Start Date End Date Taking? Authorizing Provider  buPROPion  (WELLBUTRIN  XL) 150 MG 24 hr tablet Take 1 tablet (150 mg total) by mouth  daily. Patient not taking: Reported on 06/23/2023 03/26/23   Adelia Homestead, MD  diclofenac  Sodium (VOLTAREN ) 1 % GEL Apply 2 g topically 4 (four) times daily. 03/26/23   Adelia Homestead, MD  EPINEPHrine  (EPIPEN  2-PAK) 0.3 mg/0.3 mL IJ SOAJ injection Inject 0.3 mg into the muscle as needed for anaphylaxis. 03/26/23   Adelia Homestead, MD  ibuprofen (ADVIL) 200 MG tablet Take 200 mg by mouth every 6 (six) hours as needed.    [provider]    Physical Exam: Vitals:   09/01/23 0840 09/01/23 0841 09/01/23 0852 09/01/23 0853  BP:   (!) 186/84 (!) 167/81  Pulse:   69 64  Resp:   17 17  Temp:  (!) 96.8 F (36 C)    TempSrc:  Temporal    SpO2:   100% 100%  Weight: 89.8 kg     Height: 5' 5 (1.651 m)       Constitutional: Older adult female currently in no acute distress. Eyes: PERRL, lids and conjunctivae normal ENMT: Mucous membranes are moist. Posterior pharynx clear of any exudate or lesions.  Neck: normal, supple Respiratory: clear to auscultation bilaterally, no wheezing, no crackles. Normal respiratory effort.  Cardiovascular: Regular rate and rhythm, no murmurs / rubs / gallops. No extremity edema.  Abdomen: no tenderness, no masses palpated. Bowel sounds positive.  Musculoskeletal: no clubbing / cyanosis. No joint deformity upper and lower extremities. Good ROM, no contractures. Normal muscle tone.  Skin: no rashes, lesions, ulcers. No induration Neurologic: CN 2-12 grossly intact.  Strength 5/5 in all 4.  Some mild incoordination with the right hand noted as well as mild dysarthria. Psychiatric: Normal judgment and insight. Alert and oriented x 3. Normal mood.   Data Reviewed:  EKG reveals sinus rhythm at 61 bpm with premature atrial complexes.  Reviewed labs, imaging, and pertinent records as documented.  Assessment and Plan:  CVA Acute.  Patient presented with complaints of right sided weakness, slurred speech, and incoordination.  Last known normal was  yesterday evening.  Initial CT scan of the head did not reveal any acute abnormality.  Patient was out of the window for thrombolytics.  CT angiogram of the head did not reveal any acute signs for large vessel occlusion.  MRI of the brain significant for 9 mm acute infarct within the posterior limb of the left internal capsule with background T2 flair hyperintensity signal changes thought related to chronic small vessel ischemia.  Patient had been started on aspirin and Plavix. - Admit to a telemetry bed - Stroke order set utilized - Neurochecks - Lipid panel and hemoglobin A1c - Check UDS - Check echocardiogram - PT/OT to eval and treat -  Aspirin and Plavix - Follow-up telemetry - Advised patient on need to avoid  use of ibuprofen and other NSAIDs while on dual platelet therapy - Appreciate neurology consultative services we will follow-up for any further recommendations.  Elevated blood pressure reading Initial blood pressures elevated up to 186/84.  Patient not on any blood pressure medications at baseline. - Allowing for permissive hypertension at this time only treating systolic blood pressures greater than 220 or diastolics greater than 110  Hypothyroidism Patient reports no longer being on levothyroxine  for some time now. - Check TSH  Bipolar disorder type I Patient is not on any medications for treatment at this time. -Recommend outpatient follow-up with behavioral health  Tobacco abuse Patient reports smoking half a pack cigarettes per day on average for at least 40 years.  Declined need of nicotine patch. - Continue to counsel on need of cessation of tobacco use  Obesity  BMI 32.94 kg/m   DVT prophylaxis: Lovenox Advance Care Planning:   Code Status: Full Code   Consults: Neurology  Family Communication: Daughter updated at bedside.  Severity of Illness: The appropriate patient status for this patient is INPATIENT. Inpatient status is judged to be reasonable and  necessary in order to provide the required intensity of service to ensure the patient's safety. The patient's presenting symptoms, physical exam findings, and initial radiographic and laboratory data in the context of their chronic comorbidities is felt to place them at high risk for further clinical deterioration. Furthermore, it is not anticipated that the patient will be medically stable for discharge from the hospital within 2 midnights of admission.   * I certify that at the point of admission it is my clinical judgment that the patient will require inpatient hospital care spanning beyond 2 midnights from the point of admission due to high intensity of service, high risk for further deterioration and high frequency of surveillance required.*  Author: Lena Qualia, MD 09/01/2023 9:09 AM  For on call review [email or web address].

## 2023-09-01 NOTE — ED Provider Notes (Signed)
 [place] EMERGENCY DEPARTMENT AT [facility] Provider Note   CSN: [id number] Arrival date & time: 09/01/23  0865  An emergency department physician performed an initial assessment on this suspected stroke patient at 0740.  History  Chief Complaint  Patient presents with   Code Stroke    [name] is a 63 y.o. female.  HPI 63 year old female presents with headache and right-sided weakness.  History is primarily from EMS at first.  Patient had a headache last night at 8 PM that is different than the typical headaches she gets.  Headache was still there this morning and then she developed right-sided weakness and trouble speaking.  Symptoms have been progressive.  She has been hypertensive to around 220 systolic.  Home Medications Prior to Admission medications   Medication Sig Start Date End Date Taking? Authorizing Provider  buPROPion  (WELLBUTRIN  XL) 150 MG 24 hr tablet Take 1 tablet (150 mg total) by mouth daily. Patient not taking: Reported on 06/23/2023 03/26/23   Adelia Homestead, MD  diclofenac  Sodium (VOLTAREN ) 1 % GEL Apply 2 g topically 4 (four) times daily. 03/26/23   Adelia Homestead, MD  EPINEPHrine  (EPIPEN  2-PAK) 0.3 mg/0.3 mL IJ SOAJ injection Inject 0.3 mg into the muscle as needed for anaphylaxis. 03/26/23   Adelia Homestead, MD  ibuprofen (ADVIL) 200 MG tablet Take 200 mg by mouth every 6 (six) hours as needed.    [provider]      Allergies    Bee venom    Review of Systems   Review of Systems  Neurological:  Positive for speech difficulty, weakness and headaches.    Physical Exam Updated Vital Signs BP (!) 167/81   Pulse 64   Temp (!) 96.8 F (36 C) (Temporal)   Resp 17   Ht 5' 5 (1.651 m)   Wt 89.8 kg   SpO2 100%   BMI 32.94 kg/m  Physical Exam Vitals and nursing note reviewed.  Constitutional:      Appearance: She is well-developed.  HENT:     Head: Normocephalic and atraumatic.  Cardiovascular:      Rate and Rhythm: Normal rate and regular rhythm.     Heart sounds: Normal heart sounds.  Pulmonary:     Effort: Pulmonary effort is normal.     Breath sounds: Normal breath sounds.  Abdominal:     Palpations: Abdomen is soft.     Tenderness: There is no abdominal tenderness.  Skin:    General: Skin is warm and dry.  Neurological:     Mental Status: She is alert.     Comments: On arrival, the patient is having stuttering and difficulty speaking.  No facial droop appreciated.  Limited exam due to her going to the CT scanner after initial airway clearance.  After she has come back from CT and MRI, she appears to have improved though not totally back to normal speech and now her strength has returned and she has 5/5 strength in all 4 extremities.     ED Results / Procedures / Treatments   Labs (all labs ordered are listed, but only abnormal results are displayed) Labs Reviewed  APTT - Abnormal; Notable for the following components:      Result Value   aPTT 23 (*)    All other components within normal limits  COMPREHENSIVE METABOLIC PANEL WITH GFR - Abnormal; Notable for the following components:   Potassium 3.4 (*)    CO2 21 (*)  Glucose, Bld 116 (*)    All other components within normal limits  I-STAT CHEM 8, ED - Abnormal; Notable for the following components:   Glucose, Bld 116 (*)    Calcium, Ion 1.09 (*)    TCO2 19 (*)    All other components within normal limits  CBG MONITORING, ED - Abnormal; Notable for the following components:   Glucose-Capillary 128 (*)    All other components within normal limits  PROTIME-INR  CBC  DIFFERENTIAL  ETHANOL    EKG None  Radiology MR BRAIN WO CONTRAST Result Date: 09/01/2023 CLINICAL DATA:  Provided history: Neuro deficit, acute, stroke suspected. EXAM: MRI HEAD WITHOUT CONTRAST TECHNIQUE: Multiplanar, multiecho pulse sequences of the brain and surrounding structures were obtained without intravenous contrast. COMPARISON:   Non-contrast head CT and CT angiogram head/neck performed earlier today 09/01/2023. FINDINGS: Brain: No age-advanced or lobar predominant cerebral atrophy. 9 mm acute infarct within the posterior limb of left internal capsule (for instance as seen on series 2, image 28) (series 3, image 18). Small scattered foci of T2 FLAIR hyperintense signal abnormality elsewhere within the cerebral white matter, nonspecific but most often secondary to chronic small vessel immediately. No cortical encephalomalacia is identified. No evidence of an intracranial mass. No chronic intracranial blood products. No extra-axial fluid collection. No midline shift. Vascular: Maintained flow voids within the proximal large arterial vessels. 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 Skull and upper cervical spine: No focal worrisome marrow lesion. Sinuses/Orbits: No mass or acute finding within the imaged orbits. Mild-to-moderate mucosal thickening within the bilateral ethmoid sinuses. Other: Trace fluid within bilateral mastoid air cells. IMPRESSION: 1. 9 mm acute infarct within the posterior limb of left internal capsule. 2. Background T2 FLAIR hyperintense signal changes within the cerebral white matter, nonspecific but most often secondary to chronic small vessel ischemia. 3. 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 4. Mild-to-moderate ethmoid sinus mucosal thickening, bilaterally. Electronically Signed   By: Bascom Lily D.O.   On: 09/01/2023 08:56   CT ANGIO HEAD NECK W WO CM W PERF (CODE STROKE) Result Date: 09/01/2023 CLINICAL DATA:  Provided history: Neuro deficit, acute, stroke suspected. Noncontrast head CT 09/01/2023. EXAM: CT ANGIOGRAPHY HEAD AND NECK CT PERFUSION BRAIN TECHNIQUE: Multidetector CT imaging of the head and neck was performed using the standard protocol during bolus administration of intravenous contrast. Multiplanar CT  image reconstructions and MIPs were obtained to evaluate the vascular anatomy. Carotid stenosis measurements (when applicable) are obtained utilizing NASCET criteria, using the distal internal carotid diameter as the denominator. Multiphase CT imaging of the brain was performed following IV bolus contrast injection. Subsequent parametric perfusion maps were calculated using RAPID software. RADIATION DOSE REDUCTION: This exam was performed according to the departmental dose-optimization program which includes automated exposure control, adjustment of the mA and/or kV according to patient size and/or use of iterative reconstruction technique. CONTRAST:  OMNIPAQUE IOHEXOL 350 MG/ML SOLN COMPARISON:  Non-contrast head CT performed earlier today 09/01/2023. Maxillofacial CT 08/25/2013 FINDINGS: CTA NECK FINDINGS Aortic arch: Standard aortic branching. Mild sclerotic plaque within the visualized thoracic aorta. Streak/beam hardening artifact arising from a dense contrast bolus partially obscures the left subclavian artery. Within this limitation, there is no appreciable hemodynamically significant innominate or proximal subclavian artery stenosis. Right carotid system: CCA and ICA patent within the neck without stenosis. Mild atherosclerotic plaque within the proximal ICA. Left carotid system: CCA and ICA patent within the neck without stenosis. Mild sclerotic  plaque about the carotid bifurcation and within the proximal ICA. Vertebral arteries: Codominant and patent within the neck without stenosis or significant atherosclerotic disease. Skeleton: Spondylosis at the cervical and visualized upper thoracic levels. No acute fracture or aggressive osseous lesion. Other neck: 2.1 cm nodule within the right thyroid  lobe. 1.9 x 1.6 cm low-attenuation mass at the left level I station, indeterminate but unchanged from the prior maxillofacial CT of 08/25/2013 and likely benign. Upper chest: No consolidation within the imaged  lung apices. Review of the MIP images confirms the above findings CTA HEAD FINDINGS Anterior circulation: The intracranial internal carotid arteries are patent. Mild noncalcified plaque within the intracranial left ICA. The M1 middle cerebral arteries are patent. No M2 proximal branch occlusion or high-grade proximal stenosis. The anterior cerebral arteries are patent. Mildly hypoplastic right A1 segment. No intracranial aneurysm is identified. Posterior circulation: The intracranial vertebral arteries are patent. The basilar artery is patent. The posterior cerebral arteries are patent. Moderate-to-severe stenosis within a right PCA branch at the P2/P3 junction (series 13, image 21). Fetal origin right PCA. A small left posterior communicating artery is present. Venous sinuses: Within the limitations of contrast timing, no convincing thrombus. Anatomic variants: As described. Review of the MIP images confirms the above findings CT Brain Perfusion Findings: CBF (<30%) Volume: 0mL Perfusion (Tmax>6.0s) volume: 0mL Mismatch Volume: 0mL Infarction Location:None identified. No emergent large vessel occlusion identified. These results were called by telephone at the time of interpretation on 09/01/2023 at 8:10 am to provider ERIC [facility] , who verbally acknowledged these results. IMPRESSION: CTA neck: 1. Common carotid and internal carotid arteries patent within the neck without stenosis. Mild atherosclerotic plaque bilaterally, as described. 2. Vertebral arteries codominant and patent within the neck without stenosis or significant atherosclerotic disease. 3. Aortic Atherosclerosis (ICD10-I70.0). 4. 2.1 cm right thyroid  lobe nodule. A non-emergent thyroid  ultrasound is recommended for further evaluation. Reference: J Am Coll Radiol. 2015 Feb;12(2): 143-50. 5. 1.9 x 1.6 cm low attenuation mass at the left level I station, indeterminate but unchanged from the prior maxillofacial CT of 08/25/2013 and likely benign. Consider  a non-emergent contrast-enhanced neck CT for further characterization. CTA head: 1. No proximal intracranial large vessel occlusion identified. 2. Intracranial atherosclerotic disease as described. Most notably, there is a moderate-to-severe stenosis within a right PCA branch at the P2/P3 junction. Electronically Signed   By: Bascom Lily D.O.   On: 09/01/2023 08:37   CT HEAD CODE STROKE WO CONTRAST Result Date: 09/01/2023 CLINICAL DATA:  Code stroke. 63 year old female. Right side deficit. EXAM: CT HEAD WITHOUT CONTRAST TECHNIQUE: Contiguous axial images were obtained from the base of the skull through the vertex without intravenous contrast. RADIATION DOSE REDUCTION: This exam was performed according to the departmental dose-optimization program which includes automated exposure control, adjustment of the mA and/or kV according to patient size and/or use of iterative reconstruction technique. COMPARISON:  Head CT 08/25/2013. FINDINGS: Brain: Cerebral volume remains normal for age. No midline shift, ventriculomegaly, mass effect, evidence of mass lesion, intracranial hemorrhage or evidence of cortically based acute infarction. Gray-white matter differentiation is largely normal throughout the brain. There is subtle asymmetric hypodensity in the left corona radiata best seen on coronal image 36. Vascular: Mild Calcified atherosclerosis at the skull base. No suspicious intracranial vascular hyperdensity. Skull: No acute osseous abnormality identified. Sinuses/Orbits: Mild ethmoid, moderate left frontoethmoidal recess mucosal thickening is new since 2015. Otherwise Visualized paranasal sinuses and mastoids are well aerated. Other: No gaze deviation. No acute orbit or scalp  soft tissue finding. ASPECTS [facility] An Stroke Program Early CT Score) Total score (0-10 with 10 being normal): 10 IMPRESSION: 1. No acute cortically based infarct or intracranial hemorrhage identified. ASPECTS 10. 2. Subtle asymmetric left frontal  lobe white matter hypodensity, age indeterminate. 3. These results were communicated to Dr. Lindzen at 7:52 am on 09/01/2023 by text page via the [facility] messaging system. Electronically Signed   By: Marlise Simpers M.D.   On: 09/01/2023 07:53    Procedures Procedures    Medications Ordered in ED Medications   stroke: early stages of recovery book (has no administration in time range)  sodium chloride  flush (NS) 0.9 % injection 3 mL (3 mLs Intravenous Given 09/01/23 0839)  iohexol (OMNIPAQUE) 350 MG/ML injection 100 mL (100 mLs Intravenous Contrast Given 09/01/23 0807)  aspirin suppository 300 mg ( Rectal See Alternative 09/01/23 0856)    Or  aspirin tablet 325 mg (325 mg Oral Given 09/01/23 0856)  clopidogrel (PLAVIX) tablet 300 mg (300 mg Oral Given 09/01/23 0856)    ED Course/ Medical Decision Making/ A&P                                 Medical Decision Making Amount and/or Complexity of Data Reviewed Independent Historian: EMS Labs: ordered.    Details: Normal WBC Radiology: ordered.    Details: No head bleed.  MRI shows small stroke. ECG/medicine tests: ordered and independent interpretation performed.    Details: No ischemia  Risk Decision regarding hospitalization.   Patient has had significant improvement in her speech and weakness since arrival to the ED.  Ultimately not found to have an LVO or head bleed.  Does have a stroke that is consistent with her symptoms.  Neurology is loading with aspirin and Plavix.  She will be admitted to the hospitalist service, discussed with Dr. Felipe Horton.        Final Clinical Impression(s) / ED Diagnoses Final diagnoses:  Acute ischemic stroke [facility])    Rx / DC Orders ED Discharge Orders     None         Jerilynn Montenegro, MD 09/01/23 [phone]

## 2023-09-01 NOTE — Evaluation (Signed)
 Speech Language Pathology Evaluation Patient Details Name: [name] MRN: [id number] DOB: 10-Aug-1960 Today's Date: 09/01/2023 Time: 0102-7253 SLP Time Calculation (min) (ACUTE ONLY): 14 min  Problem List:  Patient Active Problem List   Diagnosis Date Noted   CVA (cerebral vascular accident) (HCC) 09/01/2023   Numbness and tingling of foot 10/10/2021   Pre-diabetes 01/08/2021   Atypical mole 01/08/2021   Osteoarthritis of spine with radiculopathy, lumbar region 02/25/2017   Routine general medical examination at a health care facility 01/09/2014   Tobacco use disorder 01/09/2014   Hypothyroidism 10/25/2013   Genital herpes 06/29/2012   Past Medical History:  Past Medical History:  Diagnosis Date   Anxiety    Arthritis    Back pain    Bipolar affective disorder (HCC)    Depression    Genital herpes    History of chicken pox    Hypothyroidism    Past Surgical History:  Past Surgical History:  Procedure Laterality Date   CESAREAN SECTION  1993   COLONOSCOPY  2004   neg   HEMORRHOID SURGERY  2006   LAPAROSCOPIC SALPINGO OOPHERECTOMY Left 12/19/2014   Procedure: LAPAROSCOPIC LEFT SALPINGO OOPHORECTOMY;  Surgeon: Cyd Dowse, MD;  Location: [facility];  Service: Gynecology;  Laterality: Left;   LAPAROSCOPY N/A 12/19/2014   Procedure: LAPAROSCOPY OPERATIVE;  Surgeon: Cyd Dowse, MD;  Location: [facility] OR[facility];  Service: Gynecology;  Laterality: N/A;   TONSILLECTOMY  1981   TUBAL LIGATION  1994   HPI:  [name] is 63 yo presenting [facility] ED on [facility] due to headache and R sided weakness. MRI shows acute L internal capsule infarct. PMH: anxiety, bipolar, migraines, depression, hypothyroidism   Assessment / Plan / Recommendation Clinical Impression  Pt states she has returned to baseline. She scored 28/30 on the SLUMS which is considered WFL. SLP f/u is not clinically indicated, will sign off.    SLP Assessment  SLP Recommendation/Assessment: Patient does not need any  further Speech Language Pathology Services SLP Visit Diagnosis: Cognitive communication deficit (R41.841)     Assistance Recommended at Discharge  None  Functional Status Assessment Patient has not had a recent decline in their functional status  Frequency and Duration           SLP Evaluation Cognition  Overall Cognitive Status: Within Functional Limits for tasks assessed Orientation Level: Oriented X4       Comprehension  Auditory Comprehension Overall Auditory Comprehension: Appears within functional limits for tasks assessed    Expression Expression Primary Mode of Expression: Verbal Verbal Expression Overall Verbal Expression: Appears within functional limits for tasks assessed   Oral / Motor  Oral Motor/Sensory Function Overall Oral Motor/Sensory Function: Within functional limits Motor Speech Overall Motor Speech: Appears within functional limits for tasks assessed            Amil Kale, M.A., CCC-SLP Speech Language Pathology, Acute Rehabilitation Services  Secure Chat preferred [phone]  09/01/2023, 3:55 PM

## 2023-09-01 NOTE — ED Triage Notes (Signed)
 Pt presents to ED via [facility] from home with stroke like symptoms. LKW last night at 2000. Pt started having a headache last night unlike her normal headaches and reported the pain being worse. This morning the pt woke up with right sided weakness and slurred speech. Pt also reports some dizziness. CBG WNL.

## 2023-09-02 ENCOUNTER — Other Ambulatory Visit (HOSPITAL_COMMUNITY): Payer: Self-pay

## 2023-09-02 DIAGNOSIS — Z6832 Body mass index (BMI) 32.0-32.9, adult: Secondary | ICD-10-CM

## 2023-09-02 DIAGNOSIS — F17210 Nicotine dependence, cigarettes, uncomplicated: Secondary | ICD-10-CM

## 2023-09-02 DIAGNOSIS — R29700 NIHSS score 0: Secondary | ICD-10-CM | POA: Diagnosis not present

## 2023-09-02 DIAGNOSIS — Z8673 Personal history of transient ischemic attack (TIA), and cerebral infarction without residual deficits: Secondary | ICD-10-CM | POA: Diagnosis not present

## 2023-09-02 DIAGNOSIS — I6381 Other cerebral infarction due to occlusion or stenosis of small artery: Secondary | ICD-10-CM | POA: Diagnosis not present

## 2023-09-02 DIAGNOSIS — E785 Hyperlipidemia, unspecified: Secondary | ICD-10-CM | POA: Diagnosis not present

## 2023-09-02 DIAGNOSIS — I10 Essential (primary) hypertension: Secondary | ICD-10-CM | POA: Diagnosis not present

## 2023-09-02 LAB — COMPREHENSIVE METABOLIC PANEL WITH GFR
ALT: 11 U/L (ref 0–44)
AST: 15 U/L (ref 15–41)
Albumin: 3.2 g/dL — ABNORMAL LOW (ref 3.5–5.0)
Alkaline Phosphatase: 75 U/L (ref 38–126)
Anion gap: 9 (ref 5–15)
BUN: 15 mg/dL (ref 8–23)
CO2: 24 mmol/L (ref 22–32)
Calcium: 8.9 mg/dL (ref 8.9–10.3)
Chloride: 109 mmol/L (ref 98–111)
Creatinine, Ser: 0.71 mg/dL (ref 0.44–1.00)
GFR, Estimated: >60 mL/min (ref >60)
Glucose, Bld: 103 mg/dL — ABNORMAL HIGH (ref 70–99)
Potassium: 4.2 mmol/L (ref 3.5–5.1)
Sodium: 142 mmol/L (ref 135–145)
Total Bilirubin: 0.5 mg/dL (ref 0.0–1.2)
Total Protein: 6.1 g/dL — ABNORMAL LOW (ref 6.5–8.1)

## 2023-09-02 LAB — CBC
HCT: 39 % (ref 36.0–46.0)
Hemoglobin: 12.5 g/dL (ref 12.0–15.0)
MCH: 29.6 pg (ref 26.0–34.0)
MCHC: 32.1 g/dL (ref 30.0–36.0)
MCV: 92.2 fL (ref 80.0–100.0)
Platelets: 275 10*3/uL (ref 150–400)
RBC: 4.23 MIL/uL (ref 3.87–5.11)
RDW: 13.6 % (ref 11.5–15.5)
WBC: 5.8 10*3/uL (ref 4.0–10.5)
nRBC: 0 % (ref 0.0–0.2)

## 2023-09-02 LAB — HIV ANTIBODY (ROUTINE TESTING W REFLEX): HIV Screen 4th Generation wRfx: NONREACTIVE

## 2023-09-02 MED ORDER — ATORVASTATIN CALCIUM 80 MG PO TABS
80.0000 mg | ORAL_TABLET | Freq: Every day | ORAL | 3 refills | Status: AC
  Filled 2023-09-02: qty 90, 90d supply, fill #0

## 2023-09-02 MED ORDER — CLOPIDOGREL BISULFATE 75 MG PO TABS
75.0000 mg | ORAL_TABLET | Freq: Every day | ORAL | 0 refills | Status: AC
  Filled 2023-09-02: qty 19, 19d supply, fill #0

## 2023-09-02 MED ORDER — MELATONIN 3 MG PO TABS
3.0000 mg | ORAL_TABLET | Freq: Once | ORAL | Status: AC
  Administered 2023-09-02: 3 mg via ORAL
  Filled 2023-09-02: qty 1

## 2023-09-02 MED ORDER — LIDOCAINE 5 % EX PTCH
1.0000 | MEDICATED_PATCH | CUTANEOUS | Status: DC
  Administered 2023-09-02: 1 via TRANSDERMAL
  Filled 2023-09-02: qty 1

## 2023-09-02 MED ORDER — ASPIRIN 81 MG PO TBEC
81.0000 mg | DELAYED_RELEASE_TABLET | Freq: Every day | ORAL | 12 refills | Status: AC
  Filled 2023-09-02: qty 30, 30d supply, fill #0

## 2023-09-02 NOTE — Care Management Obs Status (Signed)
 MEDICARE OBSERVATION STATUS NOTIFICATION   Patient Details  Name: MACKENZIE GROOM MRN: [id number] Date of Birth: 06-29-1960   Medicare Observation Status Notification Given:  Yes    Jonathan Neighbor, RN 09/02/2023, 11:09 AM

## 2023-09-02 NOTE — TOC Transition Note (Signed)
 Transition of Care [facility]) - Discharge Note   Patient Details  Name: [name] MRN: [id number] Date of Birth: [birth year]  [facility] CM/SW Contact:  Jonathan Neighbor, RN Phone Number: 09/02/2023, 11:10 AM   Clinical Narrative:     Pt is from home alone. Her daughter or neighbor can check on her. She still is working part-time.  She drives self as needed.  She doesn't take any medications at home currently.  No follow up per PT/ST. No DME needs.  Pt has transport home.  Final next level of care: Home/Self Care Barriers to Discharge: No Barriers Identified   Patient Goals and CMS Choice            Discharge Placement                       Discharge Plan and Services Additional resources added to the After Visit Summary for                                       Social Drivers of Health (SDOH) Interventions SDOH Screenings   Food Insecurity: No Food Insecurity (09/02/2023)  Housing: Unknown (09/02/2023)  Transportation Needs: No Transportation Needs (09/02/2023)  Utilities: Not At Risk (09/02/2023)  Alcohol Screen: Low Risk  (06/23/2023)  Depression (PHQ2-9): Low Risk  (06/23/2023)  Financial Resource Strain: Medium Risk (06/23/2023)  Physical Activity: Inactive (06/23/2023)  Social Connections: Moderately Integrated (06/23/2023)  Stress: Stress Concern Present (06/23/2023)  Tobacco Use: High Risk (09/01/2023)  Health Literacy: Adequate Health Literacy (06/23/2023)     Readmission Risk Interventions     No data to display

## 2023-09-02 NOTE — Evaluation (Signed)
 Occupational Therapy Evaluation and Discharge Patient Details Name: [name] MRN: [id number] DOB: 1960/11/08 Today's Date: 09/02/2023   History of Present Illness   Pt is 63 yo presenting [facility] ED on 6/11 due to headache and R sided weakness. MRI 9 mm acute infarct within the posterior limb of left internal  capsule. PMH: anxiety, bipolar, migraines, depression, hypothyroidism.     Clinical Impressions [name] was evaluated s/p the above admission list. She is indep and working at baseline. Upon evaluation, pt demonstrated indep ability to complete mobility and ADLs, her R weakness has resolved. Pt was receptive of stroke BE FAST education and importance of medication management. Pt does not require further acute, or follow up OT services. Recommend discharge back to pt's environment with assist as needed. OT to sign off with appreciation of order, please re-consult if needed.       If plan is discharge home, recommend the following:   Assist for transportation     Functional Status Assessment   Patient has had a recent decline in their functional status and demonstrates the ability to make significant improvements in function in a reasonable and predictable amount of time.       Precautions/Restrictions   Precautions Precautions: None Recall of Precautions/Restrictions: Intact     Mobility Bed Mobility Overal bed mobility: Independent      Transfers Overall transfer level: Independent        Balance Overall balance assessment: Independent     ADL either performed or assessed with clinical judgement   ADL Overall ADL's : Independent;At baseline             Vision Baseline Vision/History: 1 Wears glasses (to read) Vision Assessment?: No apparent visual deficits     Perception Perception: Impaired       Praxis Praxis: WFL       Pertinent Vitals/Pain Pain Assessment Pain Assessment: Faces Faces Pain Scale: No hurt     Extremity/Trunk  Assessment Upper Extremity Assessment Upper Extremity Assessment: Overall WFL for tasks assessed (demonstrated WFL ROM, MMT, coordination and propriception)   Lower Extremity Assessment Lower Extremity Assessment: Defer to PT evaluation   Cervical / Trunk Assessment Cervical / Trunk Assessment: Normal   Communication Communication Communication: No apparent difficulties   Cognition Arousal: Alert Behavior During Therapy: WFL for tasks assessed/performed Cognition: No apparent impairments             OT - Cognition Comments: pt recalled stroke signs and symptoms                 Following commands: Intact       Cueing  General Comments   Cueing Techniques: Verbal cues  VSS, pt eager to d/c. Pt verbalized understanding of BE FAST and medication management    Home Living Family/patient expects to be discharged to:: Private residence Living Arrangements: Alone Available Help at Discharge: Available PRN/intermittently;Neighbor;Family Type of Home: House Home Access: Stairs to enter [employer] of Steps: 5 Entrance Stairs-Rails: Can reach both Home Layout: One level     Bathroom Shower/Tub: Chief Strategy Officer: Standard Bathroom Accessibility: No   Home Equipment: None      Lives With: Alone    Prior Functioning/Environment Prior Level of Function : Working/employed;Driving;Independent/Modified Independent             Mobility Comments: ind with out AD ADLs Comments: Pt is ind with finances and all IADL's, works 4 days/wk    OT Problem List: Impaired UE  functional use        OT Goals(Current goals can be found in the care plan section)   Acute Rehab OT Goals Patient Stated Goal: home asap OT Goal Formulation: With patient Time For Goal Achievement: 09/02/23 Potential to Achieve Goals: Good   AM-PAC OT 6 Clicks Daily Activity     Outcome Measure Help from another person eating meals?: None Help from another  person taking care of personal grooming?: None Help from another person toileting, which includes using toliet, bedpan, or urinal?: None Help from another person bathing (including washing, rinsing, drying)?: None Help from another person to put on and taking off regular upper body clothing?: None Help from another person to put on and taking off regular lower body clothing?: None 6 Click Score: 24   End of Session Nurse Communication: Mobility status  Activity Tolerance: Patient tolerated treatment well Patient left: in bed;with call bell/phone within reach  OT Visit Diagnosis: Hemiplegia and hemiparesis Hemiplegia - Right/Left:  (resolved) Hemiplegia - caused by: Cerebral infarction                Time: 0958-1010 OT Time Calculation (min): 12 min Charges:  OT General Charges $OT Visit: 1 Visit OT Evaluation $OT Eval Low Complexity: 1 Low  Veryl Gottron, OTR/L Acute Rehabilitation Services Office [phone] Secure Chat Communication Preferred   Starla Easterly 09/02/2023, 12:09 PM

## 2023-09-02 NOTE — Plan of Care (Signed)
  Problem: Education: Goal: Knowledge of disease or condition will improve Outcome: Adequate for Discharge

## 2023-09-02 NOTE — Discharge Summary (Signed)
 Physician Discharge Summary  [name] EXB:[id number] DOB: Jun 20, 1960 DOA: 09/01/2023  PCP: Adelia Homestead, MD  Admit date: 09/01/2023 Discharge date: 09/02/2023  Time spent: 40 minutes  Recommendations for Outpatient Follow-up:  Follow outpatient CBC/CMP  Follow with neurology outpatient Follow blood pressure outpatient - expect she'll need antihypertensive at some point, fluctuating widely here Encourage smoking cessation Significantly elevated LDL, follow outpatient 2.1 cm thyroid  nodule needs follow up ultrasound 1.9x1.6 cm mass at L level I station needs follow up CT contrast enhanced neck outpatient  Discharge Diagnoses:  Principal Problem:   CVA (cerebral vascular accident) (HCC) Active Problems:   Elevated blood pressure reading   Hypothyroidism   Bipolar 1 disorder (HCC)   Tobacco abuse   Obesity, class 1  Discharge Condition: stable  Diet recommendation: heart healthy  Filed Weights   09/01/23 0700 09/01/23 0840  Weight: 89.8 kg 89.8 kg    History of present illness:   [name] is Yasseen Salls 63 y.o. female with medical history significant of hypothyroidism, bipolar disorder, anxiety, and depression who presents with right-sided weakness and coordination issues.   She was found to have Janelly Switalski stroke.  Her symptoms have improved.  Seen by neurology and started on DAPT + statin.  Needs further risk factor modification outpatient (BP, smoking).    See below for additional details  Hospital Course:  Assessment and Plan:  CVA Acute.  Patient presented with complaints of right sided weakness, slurred speech, and incoordination.   MRI of the brain significant for 9 mm acute infarct within the posterior limb of the left internal capsule with background T2 flair hyperintensity signal changes thought related to chronic small vessel ischemia.  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 - LDL 188, A1c 5.2 - UDS negative  - echo with EF  65-70%, no RWMA (see report) - PT/OT to eval and treat (no outpatient follow up) - appreciate neurology recommendations, thought small vessel disease -> recommending DAPT x3 weeks and then aspirin alone.  Follow with neurology outpatient.  Hypertension BP fluctuating widely here, follow outpatient, will need continued management outpatient   Hypothyroidism Patient reports no longer being on levothyroxine  for some time now. - Check TSH (wnl)   Bipolar disorder type I Patient is not on any medications for treatment at this time. -Recommend outpatient follow-up with behavioral health   Tobacco abuse Patient reports smoking half Marigene Erler pack cigarettes per day on average for at least 40 years.  Declined need of nicotine patch. - Continue to counsel on need of cessation of tobacco use   Thyroid  lobe nodule Needs follow up ultrasound  1.9x1.6 cm low attenuation mass at the left level I station Needs follow up CT scan with contrast outpatient   Obesity  Body mass index is 32.94 kg/m.    Procedures: Echo IMPRESSIONS     1. Left ventricular ejection fraction, by estimation, is 65 to 70%. The  left ventricle has normal function. The left ventricle has no regional  wall motion abnormalities. Left ventricular diastolic parameters were  normal.   2. Right ventricular systolic function is normal. The right ventricular  size is normal.   3. The mitral valve is grossly normal. Trivial mitral valve  regurgitation. No evidence of mitral stenosis.   4. The aortic valve was not well visualized. There is mild calcification  of the aortic valve. Aortic valve regurgitation is mild. Aortic valve  sclerosis is present, with no evidence of aortic valve stenosis.   5. The  inferior vena cava is normal in size with greater than 50%  respiratory variability, suggesting right atrial pressure of 3 mmHg.   6. Agitated saline contrast bubble study was negative, with no evidence  of any interatrial shunt.    Comparison(s): No prior Echocardiogram.    Consultations: neurology  Discharge Exam: Vitals:   09/02/23 1157 09/02/23 1557  BP: (!) 158/68 (!) 180/63  Pulse: (!) 58 60  Resp: 20 18  Temp: 97.7 F (36.5 C) 98.5 F (36.9 C)  SpO2: 100% 96%   No complaints  General: No acute distress. Cardiovascular: RRR Lungs: unlabored Abdomen: Soft, nontender, nondistended  Neurological: Alert and oriented 3. Moves all extremities 4 with equal strength. Cranial nerves II through XII intact.  FNF intact.  Heel to shin intact.   Skin: Warm and dry. No rashes or lesions. Extremities: No clubbing or cyanosis. No edema.   Discharge Instructions   Discharge Instructions     Ambulatory referral to Neurology   Complete by: As directed    Follow up with stroke clinic NP at [facility] in about 4-6 weeks. Thanks.   Call MD for:  difficulty breathing, headache or visual disturbances   Complete by: As directed    Call MD for:  extreme fatigue   Complete by: As directed    Call MD for:  hives   Complete by: As directed    Call MD for:  persistant dizziness or light-headedness   Complete by: As directed    Call MD for:  persistant nausea and vomiting   Complete by: As directed    Call MD for:  redness, tenderness, or signs of infection (pain, swelling, redness, odor or green/yellow discharge around incision site)   Complete by: As directed    Call MD for:  severe uncontrolled pain   Complete by: As directed    Call MD for:  temperature >100.4   Complete by: As directed    Diet - low sodium heart healthy   Complete by: As directed    Discharge instructions   Complete by: As directed    You were seen for Aliena Ghrist stroke.    Your symptoms have improved.  We've started you on aspirin and plavix.  You'll be on this for 3 weeks, then you'll transition to aspirin alone.  We'll also start you on lipitor (Del Wiseman cholesterol lowering medicine).   Your LDL was high.  You should follow outpatient with your PCP and  neurology.  Your blood pressure has fluctuated here.  It will be important to follow this with your PCP outpatient.    Quitting smoking is extremely important.   You had Marlowe Cinquemani thyroid  nodule seen on imaging.  You'll need Jayceon Troy thyroid  ultrasound outpatient.  You had Haylie Mccutcheon stable mass seen on the neck CT that was stable from 2015.  This should be followed outpatient with repeat imaging.   Return for new, recurrent, or worsening symptoms.  Please ask your PCP to request records from this hospitalization so they know what was done and what the next steps will be.   Increase activity slowly   Complete by: As directed       Allergies as of 09/02/2023       Reactions   Bee Venom Anaphylaxis        Medication List     STOP taking these medications    ibuprofen 200 MG tablet Commonly known as: ADVIL       TAKE these medications    ascorbic acid 500  MG tablet Commonly known as: VITAMIN C Take 500 mg by mouth daily.   aspirin EC 81 MG tablet Take 1 tablet (81 mg total) by mouth daily. Swallow whole. Start taking on: September 03, 2023   atorvastatin 80 MG tablet Commonly known as: LIPITOR Take 1 tablet (80 mg total) by mouth daily. Start taking on: September 03, 2023   cholecalciferol 25 MCG (1000 UNIT) tablet Commonly known as: VITAMIN D3 Take 1,000 Units by mouth daily.   clopidogrel 75 MG tablet Commonly known as: PLAVIX Take 1 tablet (75 mg total) by mouth daily for 19 days. Start taking on: September 03, 2023   diclofenac  Sodium 1 % Gel Commonly known as: VOLTAREN  Apply 2 g topically 4 (four) times daily. What changed:  when to take this reasons to take this   EPINEPHrine  0.3 mg/0.3 mL Soaj injection Commonly known as: EpiPen  2-Pak Inject 0.3 mg into the muscle as needed for anaphylaxis.   OREGANO OIL-FLAXSEED OIL PO Take 1 capsule by mouth daily.   ZINC ACETATE PO Take 1 tablet by mouth daily.       Allergies  Allergen Reactions   Bee Venom Anaphylaxis    Follow-up  Information     [facility]. Schedule an appointment as soon as possible for Dorethy Tomey visit in 1 month(s).   Specialty: Neurology Why: stroke clinic Contact information: [address] [place]  [phone]                 The results of significant diagnostics from this hospitalization (including imaging, microbiology, ancillary and laboratory) are listed below for reference.    Significant Diagnostic Studies: ECHOCARDIOGRAM COMPLETE Result Date: 09/01/2023    ECHOCARDIOGRAM REPORT   Patient Name:   [name] Date of Exam: 09/01/2023 Medical Rec #:  [id number]        Height:       65.0 in Accession #:    [id number]       Weight:       198.0 lb Date of Birth:  11-27-1960       BSA:          1.970 m Patient Age:    62 years         BP:           130/102 mmHg Patient Gender: F                HR:           59 bpm. Exam Location:  Inpatient Procedure: 2D Echo, Cardiac Doppler, Color Doppler and Saline Contrast Bubble            Study (Both Spectral and Color Flow Doppler were utilized during            procedure). Indications:    CVA  History:        Patient has no prior history of Echocardiogram examinations.  Sonographer:    Griselda Lederer Referring Phys: [id number] DEVON SHAFER IMPRESSIONS  1. Left ventricular ejection fraction, by estimation, is 65 to 70%. The left ventricle has normal function. The left ventricle has no regional wall motion abnormalities. Left ventricular diastolic parameters were normal.  2. Right ventricular systolic function is normal. The right ventricular size is normal.  3. The mitral valve is grossly normal. Trivial mitral valve regurgitation. No evidence of mitral stenosis.  4. The aortic valve was not well visualized. There is mild calcification of the aortic valve. Aortic  valve regurgitation is mild. Aortic valve sclerosis is present, with no evidence of aortic valve stenosis.  5. The inferior vena cava is  normal in size with greater than 50% respiratory variability, suggesting right atrial pressure of 3 mmHg.  6. Agitated saline contrast bubble study was negative, with no evidence of any interatrial shunt. Comparison(s): No prior Echocardiogram. Conclusion(s)/Recommendation(s): Normal biventricular function without evidence of hemodynamically significant valvular heart disease. No intracardiac source of embolism detected on this transthoracic study. Consider Yajahira Tison transesophageal echocardiogram to exclude cardiac source of embolism if clinically indicated. FINDINGS  Left Ventricle: Left ventricular ejection fraction, by estimation, is 65 to 70%. The left ventricle has normal function. The left ventricle has no regional wall motion abnormalities. The global longitudinal strain is normal despite suboptimal segment tracking. The left ventricular internal cavity size was normal in size. There is no left ventricular hypertrophy. Left ventricular diastolic parameters were normal. Right Ventricle: The right ventricular size is normal. No increase in right ventricular wall thickness. Right ventricular systolic function is normal. Left Atrium: Left atrial size was normal in size. Right Atrium: Right atrial size was normal in size. Pericardium: There is no evidence of pericardial effusion. Presence of epicardial fat layer. Mitral Valve: The mitral valve is grossly normal. Trivial mitral valve regurgitation. No evidence of mitral valve stenosis. Tricuspid Valve: The tricuspid valve is grossly normal. Tricuspid valve regurgitation is trivial. No evidence of tricuspid stenosis. Aortic Valve: The aortic valve was not well visualized. There is mild calcification of the aortic valve. Aortic valve regurgitation is mild. Aortic valve sclerosis is present, with no evidence of aortic valve stenosis. Aortic valve mean gradient measures  6.0 mmHg. Aortic valve peak gradient measures 10.8 mmHg. Aortic valve area, by VTI measures 2.76 cm.  Pulmonic Valve: The pulmonic valve was not well visualized. Pulmonic valve regurgitation is not visualized. No evidence of pulmonic stenosis. Aorta: The aortic root, ascending aorta, aortic arch and descending aorta are all structurally normal, with no evidence of dilitation or obstruction. Venous: The inferior vena cava is normal in size with greater than 50% respiratory variability, suggesting right atrial pressure of 3 mmHg. IAS/Shunts: The atrial septum is grossly normal. Agitated saline contrast was given intravenously to evaluate for intracardiac shunting. Agitated saline contrast bubble study was negative, with no evidence of any interatrial shunt.  LEFT VENTRICLE PLAX 2D LVIDd:         4.70 cm   Diastology LVIDs:         2.80 cm   LV e' medial:    6.64 cm/s LV PW:         1.00 cm   LV E/e' medial:  11.3 LV IVS:        1.17 cm   LV e' lateral:   7.72 cm/s LVOT diam:     2.10 cm   LV E/e' lateral: 9.7 LV SV:         86 LV SV Index:   44 LVOT Area:     3.46 cm  RIGHT VENTRICLE          IVC RV Basal diam:  3.10 cm  IVC diam: 1.30 cm LEFT ATRIUM             Index LA diam:        3.30 cm 1.68 cm/m LA Vol (A2C):   41.3 ml 20.97 ml/m LA Vol (A4C):   35.6 ml 18.07 ml/m LA Biplane Vol: 39.8 ml 20.21 ml/m  AORTIC VALVE AV Area (Vmax):  2.43 cm AV Area (Vmean):   2.50 cm AV Area (VTI):     2.76 cm AV Vmax:           164.00 cm/s AV Vmean:          113.000 cm/s AV VTI:            0.313 m AV Peak Grad:      10.8 mmHg AV Mean Grad:      6.0 mmHg LVOT Vmax:         115.00 cm/s LVOT Vmean:        81.500 cm/s LVOT VTI:          0.249 m LVOT/AV VTI ratio: 0.80  AORTA Ao Root diam: 3.10 cm Ao Asc diam:  3.00 cm MITRAL VALVE MV Area (PHT): 3.23 cm    SHUNTS MV Decel Time: 235 msec    Systemic VTI:  0.25 m MV E velocity: 74.90 cm/s  Systemic Diam: 2.10 cm MV Almee Pelphrey velocity: 94.30 cm/s MV E/Dula Havlik ratio:  0.79 Sheryle Donning MD Electronically signed by Sheryle Donning MD Signature Date/Time: 09/01/2023/6:25:24 PM     Final    MR BRAIN WO CONTRAST Result Date: 09/01/2023 CLINICAL DATA:  Provided history: Neuro deficit, acute, stroke suspected. EXAM: MRI HEAD WITHOUT CONTRAST TECHNIQUE: Multiplanar, multiecho pulse sequences of the brain and surrounding structures were obtained without intravenous contrast. COMPARISON:  Non-contrast head CT and CT angiogram head/neck performed earlier today 09/01/2023. FINDINGS: Brain: No age-advanced or lobar predominant cerebral atrophy. 9 mm acute infarct within the posterior limb of left internal capsule (for instance as seen on series 2, image 28) (series 3, image 18). Small scattered foci of T2 FLAIR hyperintense signal abnormality elsewhere within the cerebral white matter, nonspecific but most often secondary to chronic small vessel immediately. No cortical encephalomalacia is identified. No evidence of an intracranial mass. No chronic intracranial blood products. No extra-axial fluid collection. No midline shift. Vascular: Maintained flow voids within the proximal large arterial vessels. 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 Skull and upper cervical spine: No focal worrisome marrow lesion. Sinuses/Orbits: No mass or acute finding within the imaged orbits. Mild-to-moderate mucosal thickening within the bilateral ethmoid sinuses. Other: Trace fluid within bilateral mastoid air cells. IMPRESSION: 1. 9 mm acute infarct within the posterior limb of left internal capsule. 2. Background T2 FLAIR hyperintense signal changes within the cerebral white matter, nonspecific but most often secondary to chronic small vessel ischemia. 3. 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 4. Mild-to-moderate ethmoid sinus mucosal thickening, bilaterally. Electronically Signed   By: Bascom Lily D.O.   On: 09/01/2023 08:56   CT ANGIO HEAD NECK W WO CM W PERF (CODE STROKE) Result Date: 09/01/2023 CLINICAL DATA:   Provided history: Neuro deficit, acute, stroke suspected. Noncontrast head CT 09/01/2023. EXAM: CT ANGIOGRAPHY HEAD AND NECK CT PERFUSION BRAIN TECHNIQUE: Multidetector CT imaging of the head and neck was performed using the standard protocol during bolus administration of intravenous contrast. Multiplanar CT image reconstructions and MIPs were obtained to evaluate the vascular anatomy. Carotid stenosis measurements (when applicable) are obtained utilizing NASCET criteria, using the distal internal carotid diameter as the denominator. Multiphase CT imaging of the brain was performed following IV bolus contrast injection. Subsequent parametric perfusion maps were calculated using RAPID software. RADIATION DOSE REDUCTION: This exam was performed according to the departmental dose-optimization program which includes automated exposure control, adjustment of the mA and/or kV according to patient size and/or use  of iterative reconstruction technique. CONTRAST:  OMNIPAQUE IOHEXOL 350 MG/ML SOLN COMPARISON:  Non-contrast head CT performed earlier today 09/01/2023. Maxillofacial CT 08/25/2013 FINDINGS: CTA NECK FINDINGS Aortic arch: Standard aortic branching. Mild sclerotic plaque within the visualized thoracic aorta. Streak/beam hardening artifact arising from Anhthu Perdew dense contrast bolus partially obscures the left subclavian artery. Within this limitation, there is no appreciable hemodynamically significant innominate or proximal subclavian artery stenosis. Right carotid system: CCA and ICA patent within the neck without stenosis. Mild atherosclerotic plaque within the proximal ICA. Left carotid system: CCA and ICA patent within the neck without stenosis. Mild sclerotic plaque about the carotid bifurcation and within the proximal ICA. Vertebral arteries: Codominant and patent within the neck without stenosis or significant atherosclerotic disease. Skeleton: Spondylosis at the cervical and visualized upper thoracic  levels. No acute fracture or aggressive osseous lesion. Other neck: 2.1 cm nodule within the right thyroid  lobe. 1.9 x 1.6 cm low-attenuation mass at the left level I station, indeterminate but unchanged from the prior maxillofacial CT of 08/25/2013 and likely benign. Upper chest: No consolidation within the imaged lung apices. Review of the MIP images confirms the above findings CTA HEAD FINDINGS Anterior circulation: The intracranial internal carotid arteries are patent. Mild noncalcified plaque within the intracranial left ICA. The M1 middle cerebral arteries are patent. No M2 proximal branch occlusion or high-grade proximal stenosis. The anterior cerebral arteries are patent. Mildly hypoplastic right A1 segment. No intracranial aneurysm is identified. Posterior circulation: The intracranial vertebral arteries are patent. The basilar artery is patent. The posterior cerebral arteries are patent. Moderate-to-severe stenosis within Kadeisha Betsch right PCA branch at the P2/P3 junction (series 13, image 21). Fetal origin right PCA. Arch Methot small left posterior communicating artery is present. Venous sinuses: Within the limitations of contrast timing, no convincing thrombus. Anatomic variants: As described. Review of the MIP images confirms the above findings CT Brain Perfusion Findings: CBF (<30%) Volume: 0mL Perfusion (Tmax>6.0s) volume: 0mL Mismatch Volume: 0mL Infarction Location:None identified. No emergent large vessel occlusion identified. These results were called by telephone at the time of interpretation on 09/01/2023 at 8:10 am to provider ERIC [facility] , who verbally acknowledged these results. IMPRESSION: CTA neck: 1. Common carotid and internal carotid arteries patent within the neck without stenosis. Mild atherosclerotic plaque bilaterally, as described. 2. Vertebral arteries codominant and patent within the neck without stenosis or significant atherosclerotic disease. 3. Aortic Atherosclerosis (ICD10-I70.0). 4. 2.1 cm  right thyroid  lobe nodule. Aricka Goldberger non-emergent thyroid  ultrasound is recommended for further evaluation. Reference: J Am Coll Radiol. 2015 Feb;12(2): 143-50. 5. 1.9 x 1.6 cm low attenuation mass at the left level I station, indeterminate but unchanged from the prior maxillofacial CT of 08/25/2013 and likely benign. Consider Nhung Danko non-emergent contrast-enhanced neck CT for further characterization. CTA head: 1. No proximal intracranial large vessel occlusion identified. 2. Intracranial atherosclerotic disease as described. Most notably, there is Maysie Parkhill moderate-to-severe stenosis within Tene Gato right PCA branch at the P2/P3 junction. Electronically Signed   By: Bascom Lily D.O.   On: 09/01/2023 08:37   CT HEAD CODE STROKE WO CONTRAST Result Date: 09/01/2023 CLINICAL DATA:  Code stroke. 63 year old female. Right side deficit. EXAM: CT HEAD WITHOUT CONTRAST TECHNIQUE: Contiguous axial images were obtained from the base of the skull through the vertex without intravenous contrast. RADIATION DOSE REDUCTION: This exam was performed according to the departmental dose-optimization program which includes automated exposure control, adjustment of the mA and/or kV according to patient size and/or use of iterative reconstruction technique. COMPARISON:  Head  CT 08/25/2013. FINDINGS: Brain: Cerebral volume remains normal for age. No midline shift, ventriculomegaly, mass effect, evidence of mass lesion, intracranial hemorrhage or evidence of cortically based acute infarction. Gray-white matter differentiation is largely normal throughout the brain. There is subtle asymmetric hypodensity in the left corona radiata best seen on coronal image 36. Vascular: Mild Calcified atherosclerosis at the skull base. No suspicious intracranial vascular hyperdensity. Skull: No acute osseous abnormality identified. Sinuses/Orbits: Mild ethmoid, moderate left frontoethmoidal recess mucosal thickening is new since 2015. Otherwise Visualized paranasal sinuses and  mastoids are well aerated. Other: No gaze deviation. No acute orbit or scalp soft tissue finding. ASPECTS [facility] Stroke Program Early CT Score) Total score (0-10 with 10 being normal): 10 IMPRESSION: 1. No acute cortically based infarct or intracranial hemorrhage identified. ASPECTS 10. 2. Subtle asymmetric left frontal lobe white matter hypodensity, age indeterminate. 3. These results were communicated to Dr. Lindzen at 7:52 am on 09/01/2023 by text page via the [facility] messaging system. Electronically Signed   By: Marlise Simpers M.D.   On: 09/01/2023 07:53    Microbiology: No results found for this or any previous visit (from the past 240 hours).   Labs: Basic Metabolic Panel: Recent Labs  Lab 09/01/23 0743 09/01/23 0745 09/02/23 0521  NA 139 141 142  K 3.4* 3.6 4.2  CL 108 109 109  CO2 21*  --  24  GLUCOSE 116* 116* 103*  BUN 15 16 15   CREATININE 0.83 0.70 0.71  CALCIUM 9.1  --  8.9   Liver Function Tests: Recent Labs  Lab 09/01/23 0743 09/02/23 0521  AST 19 15  ALT 11 11  ALKPHOS 98 75  BILITOT 0.7 0.5  PROT 7.7 6.1*  ALBUMIN 3.6 3.2*   No results for input(s): LIPASE, AMYLASE in the last 168 hours. No results for input(s): AMMONIA in the last 168 hours. CBC: Recent Labs  Lab 09/01/23 0743 09/01/23 0745 09/02/23 0521  WBC 8.9  --  5.8  NEUTROABS 4.6  --   --   HGB 13.6 13.9 12.5  HCT 41.3 41.0 39.0  MCV 90.0  --  92.2  PLT 316  --  275   Cardiac Enzymes: No results for input(s): CKTOTAL, CKMB, CKMBINDEX, TROPONINI in the last 168 hours. BNP: BNP (last 3 results) No results for input(s): BNP in the last 8760 hours.  ProBNP (last 3 results) No results for input(s): PROBNP in the last 8760 hours.  CBG: Recent Labs  Lab 09/01/23 0739  GLUCAP 128*       Signed:  Donnetta Gains MD.  [facility] 09/02/2023, 4:22 PM

## 2023-09-02 NOTE — Progress Notes (Signed)
 AVS reviewed with patient. All questions answered at this time. Work note and medication given. Patient is waiting for transportation to disposition.

## 2023-09-02 NOTE — Care Management CC44 (Signed)
 Condition Code 44 Documentation Completed  Patient Details  Name: [name] MRN: [id number] Date of Birth: Sep 30, 1960   Condition Code 44 given:  Yes Patient signature on Condition Code 44 notice:  Yes Documentation of 2 MD's agreement:  Yes Code 44 added to claim:  Yes    Jonathan Neighbor, RN 09/02/2023, 11:09 AM

## 2023-09-02 NOTE — Progress Notes (Addendum)
 STROKE TEAM PROGRESS NOTE    SIGNIFICANT HOSPITAL EVENTS  6/11: CODE STROKE d/t headache, right-sided weakness, dysarthria  MRI shows left internal capsule infarct  INTERIM HISTORY/SUBJECTIVE  On exam, right-sided weakness and dysarthria has resolved.  NIH 0  Stroke workup shows HLD, current smoker, obesity as risk factors.  OBJECTIVE  CBC    Component Value Date/Time   WBC 5.8 09/02/2023 0521   RBC 4.23 09/02/2023 0521   HGB 12.5 09/02/2023 0521   HCT 39.0 09/02/2023 0521   PLT 275 09/02/2023 0521   MCV 92.2 09/02/2023 0521   MCH 29.6 09/02/2023 0521   MCHC 32.1 09/02/2023 0521   RDW 13.6 09/02/2023 0521   LYMPHSABS 3.2 09/01/2023 0743   MONOABS 0.7 09/01/2023 0743   EOSABS 0.3 09/01/2023 0743   BASOSABS 0.0 09/01/2023 0743    BMET    Component Value Date/Time   NA 142 09/02/2023 0521   K 4.2 09/02/2023 0521   CL 109 09/02/2023 0521   CO2 24 09/02/2023 0521   GLUCOSE 103 (H) 09/02/2023 0521   BUN 15 09/02/2023 0521   CREATININE 0.71 09/02/2023 0521   CALCIUM 8.9 09/02/2023 0521   GFRNONAA >60 09/02/2023 0521    IMAGING past 24 hours ECHOCARDIOGRAM COMPLETE Result Date: 09/01/2023    ECHOCARDIOGRAM REPORT   Patient Name:   [name] Date of Exam: 09/01/2023 Medical Rec #:  [id number]        Height:       65.0 in Accession #:    [id number]       Weight:       198.0 lb Date of Birth:  27-Sep-1960       BSA:          1.970 m Patient Age:    63 years         BP:           130/102 mmHg Patient Gender: F                HR:           59 bpm. Exam Location:  Inpatient Procedure: 2D Echo, Cardiac Doppler, Color Doppler and Saline Contrast Bubble            Study (Both Spectral and Color Flow Doppler were utilized during            procedure). Indications:    CVA  History:        Patient has no prior history of Echocardiogram examinations.  Sonographer:    Griselda Lederer Referring Phys: [id number] DEVON SHAFER IMPRESSIONS  1. Left ventricular ejection fraction, by estimation,  is 65 to 70%. The left ventricle has normal function. The left ventricle has no regional wall motion abnormalities. Left ventricular diastolic parameters were normal.  2. Right ventricular systolic function is normal. The right ventricular size is normal.  3. The mitral valve is grossly normal. Trivial mitral valve regurgitation. No evidence of mitral stenosis.  4. The aortic valve was not well visualized. There is mild calcification of the aortic valve. Aortic valve regurgitation is mild. Aortic valve sclerosis is present, with no evidence of aortic valve stenosis.  5. The inferior vena cava is normal in size with greater than 50% respiratory variability, suggesting right atrial pressure of 3 mmHg.  6. Agitated saline contrast bubble study was negative, with no evidence of any interatrial shunt. Comparison(s): No prior Echocardiogram. Conclusion(s)/Recommendation(s): Normal biventricular function without evidence of hemodynamically significant valvular heart disease. No intracardiac source of  embolism detected on this transthoracic study. Consider a transesophageal echocardiogram to exclude cardiac source of embolism if clinically indicated. FINDINGS  Left Ventricle: Left ventricular ejection fraction, by estimation, is 65 to 70%. The left ventricle has normal function. The left ventricle has no regional wall motion abnormalities. The global longitudinal strain is normal despite suboptimal segment tracking. The left ventricular internal cavity size was normal in size. There is no left ventricular hypertrophy. Left ventricular diastolic parameters were normal. Right Ventricle: The right ventricular size is normal. No increase in right ventricular wall thickness. Right ventricular systolic function is normal. Left Atrium: Left atrial size was normal in size. Right Atrium: Right atrial size was normal in size. Pericardium: There is no evidence of pericardial effusion. Presence of epicardial fat layer. Mitral Valve:  The mitral valve is grossly normal. Trivial mitral valve regurgitation. No evidence of mitral valve stenosis. Tricuspid Valve: The tricuspid valve is grossly normal. Tricuspid valve regurgitation is trivial. No evidence of tricuspid stenosis. Aortic Valve: The aortic valve was not well visualized. There is mild calcification of the aortic valve. Aortic valve regurgitation is mild. Aortic valve sclerosis is present, with no evidence of aortic valve stenosis. Aortic valve mean gradient measures  6.0 mmHg. Aortic valve peak gradient measures 10.8 mmHg. Aortic valve area, by VTI measures 2.76 cm. Pulmonic Valve: The pulmonic valve was not well visualized. Pulmonic valve regurgitation is not visualized. No evidence of pulmonic stenosis. Aorta: The aortic root, ascending aorta, aortic arch and descending aorta are all structurally normal, with no evidence of dilitation or obstruction. Venous: The inferior vena cava is normal in size with greater than 50% respiratory variability, suggesting right atrial pressure of 3 mmHg. IAS/Shunts: The atrial septum is grossly normal. Agitated saline contrast was given intravenously to evaluate for intracardiac shunting. Agitated saline contrast bubble study was negative, with no evidence of any interatrial shunt.  LEFT VENTRICLE PLAX 2D LVIDd:         4.70 cm   Diastology LVIDs:         2.80 cm   LV e' medial:    6.64 cm/s LV PW:         1.00 cm   LV E/e' medial:  11.3 LV IVS:        1.17 cm   LV e' lateral:   7.72 cm/s LVOT diam:     2.10 cm   LV E/e' lateral: 9.7 LV SV:         86 LV SV Index:   44 LVOT Area:     3.46 cm  RIGHT VENTRICLE          IVC RV Basal diam:  3.10 cm  IVC diam: 1.30 cm LEFT ATRIUM             Index LA diam:        3.30 cm 1.68 cm/m LA Vol (A2C):   41.3 ml 20.97 ml/m LA Vol (A4C):   35.6 ml 18.07 ml/m LA Biplane Vol: 39.8 ml 20.21 ml/m  AORTIC VALVE AV Area (Vmax):    2.43 cm AV Area (Vmean):   2.50 cm AV Area (VTI):     2.76 cm AV Vmax:            164.00 cm/s AV Vmean:          113.000 cm/s AV VTI:            0.313 m AV Peak Grad:      10.8 mmHg AV Mean Grad:  6.0 mmHg LVOT Vmax:         115.00 cm/s LVOT Vmean:        81.500 cm/s LVOT VTI:          0.249 m LVOT/AV VTI ratio: 0.80  AORTA Ao Root diam: 3.10 cm Ao Asc diam:  3.00 cm MITRAL VALVE MV Area (PHT): 3.23 cm    SHUNTS MV Decel Time: 235 msec    Systemic VTI:  0.25 m MV E velocity: 74.90 cm/s  Systemic Diam: 2.10 cm MV A velocity: 94.30 cm/s MV E/A ratio:  0.79 Sheryle Donning MD Electronically signed by Sheryle Donning MD Signature Date/Time: 09/01/2023/6:25:24 PM    Final     Vitals:   09/02/23 0007 09/02/23 0341 09/02/23 0753 09/02/23 1157  BP: (!) 152/83 (!) 162/66 139/73 (!) 158/68  Pulse: (!) 58 (!) 53 (!) 54 (!) 58  Resp: 16 16 18 20   Temp: 98 F (36.7 C) 97.7 F (36.5 C) 97.9 F (36.6 C) 97.7 F (36.5 C)  TempSrc: Oral Oral Oral Oral  SpO2: 100% 97% 98% 100%  Weight:      Height:         PHYSICAL EXAM General:  Alert, well-nourished, well-developed patient in no acute distress CV: Regular rate and rhythm on monitor Respiratory:  Regular, unlabored respirations on room air   NEURO:  Mental Status: AA&Ox3, patient is able to give clear and coherent history Speech/Language: No dysarthria or aphasia.  Naming, repetition, comprehension intact.  Cranial Nerves:  II: PERRL. Visual fields full.  III, IV, VI: EOMI. Eyelids elevate symmetrically.  V: Sensation is intact to light touch and symmetrical to face.  VII: Face is symmetrical resting and smiling VIII: hearing intact to voice. IX, X: Phonation normal, no dysarthria [id number] shrug 5/5. XII: tongue is midline without fasciculations. Motor: 5/5 strength to all muscle groups tested.  Tone: is normal and bulk is normal Sensation- Intact to light touch bilaterally. Extinction absent to light touch to DSS.   Coordination: FTN intact bilaterally, HKS: no ataxia in BLE.No drift.  Gait-  deferred  Most Recent NIH: 0    ASSESSMENT/PLAN  Ms. [name] is a 63 y.o. female with history of migraines, hypothyroidism, anxiety/depression, bipolar who was brought in by EMS as a code stroke 6/11 due to headache, right side weakness, slurred speech.  MRI identified left internal capsule acute infarct, admitted for full stroke workup.  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 Therapy recommendations:  No follow up needed  Disposition:  likely home 6/12  Hypertension Home meds:  none Stable Long-term BP goal normotensive  Hyperlipidemia Home meds:  none LDL 188, goal < 70 Add Lipitor 80mg   Continue statin at discharge  Tobacco Abuse Patient smokes 0.5 packs per day for 33 years       Ready to quit? Yes Nicotine replacement therapy provided  Other Stroke Risk Factors Obesity, Body mass index is 32.94 kg/m.,  BMI >/= 30 associated with increased stroke risk, recommend weight loss, diet and exercise as appropriate  Migraine  Hospital day # 1  Patient is OK for discharge from neurology standpoint, with recommendations as above. Follow-up with outpatient neurology in 8 weeks.    Pt seen by Neuro NP/APP and later by MD. Note/plan to be edited by MD as needed.    Audrene Lease, DNP, AGACNP-BC [facility] Please use AMION for contact information & EPIC for messaging.  ATTENDING NOTE: I reviewed above note and agree  with the assessment and plan. Pt was seen and examined.   No family at bedside.  Patient stated her symptoms are resolved, denies headache.  Neuroexam intact.  Patient stroke likely due to small vessel disease given uncontrol risk factors.  Aggressive stroke risk factor modification, continue DAPT for 3 weeks and then aspirin alone, continue statin.  Follow-up at [facility]  For detailed assessment and plan, please refer to above as I have made changes wherever appropriate.   Consuelo Denmark, MD PhD Stroke Neurology 09/02/2023 7:10 PM     To contact Stroke Continuity provider, please refer to [email or web address]. After hours, contact General Neurology

## 2023-09-03 ENCOUNTER — Telehealth: Payer: Self-pay

## 2023-09-03 NOTE — Transitions of Care (Post Inpatient/ED Visit) (Signed)
   09/03/2023  Name: [name] MRN: [id number] DOB: Feb 23, 1961  Today's TOC FU Call Status: Today's TOC FU Call Status:: Successful TOC FU Call Completed TOC FU Call Complete Date: 09/03/23 Patient's Name and Date of Birth confirmed.  Transition Care Management Follow-up Telephone Call Date of Discharge: 09/02/23 Discharge Facility: [facility]) Type of Discharge: Inpatient Admission Primary Inpatient Discharge Diagnosis:: cerebral infarction How have you been since you were released from the hospital?: Better Any questions or concerns?: No  Items Reviewed: Did you receive and understand the discharge instructions provided?: Yes Medications obtained,verified, and reconciled?: Yes (Medications Reviewed) Any new allergies since your discharge?: No Dietary orders reviewed?: Yes Do you have support at home?: Yes People in Home [RPT]: friend(s)  Medications Reviewed Today: Medications Reviewed Today     Reviewed by Darrall Ellison, LPN (Licensed Practical Nurse) on 09/03/23 at 1051  Med List Status: <None>   Medication Order Taking? Sig Documenting Provider Last Dose Status Informant  ascorbic acid (VITAMIN C) 500 MG tablet [id number] Yes Take 500 mg by mouth daily. [provider]  Active Self  aspirin  EC 81 MG tablet [id number] Yes Take 1 tablet (81 mg total) by mouth daily. Swallow whole. Etter Hermann., MD  Active   atorvastatin  (LIPITOR ) 80 MG tablet [id number] Yes Take 1 tablet (80 mg total) by mouth daily. Etter Hermann., MD  Active   cholecalciferol (VITAMIN D3) 25 MCG (1000 UNIT) tablet [id number] Yes Take 1,000 Units by mouth daily. [provider]  Active Self  clopidogrel  (PLAVIX ) 75 MG tablet [id number] Yes Take 1 tablet (75 mg total) by mouth daily for 19 days. Etter Hermann., MD  Active   diclofenac  Sodium (VOLTAREN ) 1 % GEL [id number] Yes Apply 2 g topically 4 (four) times daily.  Patient taking differently: Apply 2 g  topically 2 (two) times daily as needed (shoulder pain).   Adelia Homestead, MD  Active Self  EPINEPHrine  (EPIPEN  2-PAK) 0.3 mg/0.3 mL IJ SOAJ injection [id number] Yes Inject 0.3 mg into the muscle as needed for anaphylaxis. Adelia Homestead, MD  Active Self  Oregano-Flaxseed Oil (OREGANO OIL-FLAXSEED OIL PO) [id number] Yes Take 1 capsule by mouth daily. [provider]  Active Self  Zinc Acetate, Oral, (ZINC ACETATE PO) [id number] Yes Take 1 tablet by mouth daily. [provider]  Active Self            Home Care and Equipment/Supplies: Were Home Health Services Ordered?: NA Any new equipment or medical supplies ordered?: NA  Functional Questionnaire: Do you need assistance with bathing/showering or dressing?: No Do you need assistance with meal preparation?: No Do you need assistance with eating?: No Do you have difficulty maintaining continence: No Do you need assistance with getting out of bed/getting out of a chair/moving?: No Do you have difficulty managing or taking your medications?: No  Follow up appointments reviewed: PCP Follow-up appointment confirmed?: Yes Date of PCP follow-up appointment?: 09/07/23 Follow-up Provider: [facility] Follow-up appointment confirmed?: Yes Date of Specialist follow-up appointment?: 10/01/23 Do you need transportation to your follow-up appointment?: No Do you understand care options if your condition(s) worsen?: Yes-patient verbalized understanding    SIGNATURE Darrall Ellison, LPN [facility] Nurse Health Advisor Direct Dial [phone]

## 2023-09-07 ENCOUNTER — Ambulatory Visit: Payer: Self-pay | Admitting: Family Medicine

## 2023-09-07 ENCOUNTER — Ambulatory Visit: Admitting: Family Medicine

## 2023-09-07 ENCOUNTER — Encounter: Payer: Self-pay | Admitting: Family Medicine

## 2023-09-07 VITALS — BP 132/76 | HR 84 | Temp 97.8°F | Ht 65.0 in | Wt 195.0 lb

## 2023-09-07 DIAGNOSIS — Z09 Encounter for follow-up examination after completed treatment for conditions other than malignant neoplasm: Secondary | ICD-10-CM

## 2023-09-07 DIAGNOSIS — I639 Cerebral infarction, unspecified: Secondary | ICD-10-CM

## 2023-09-07 DIAGNOSIS — F17200 Nicotine dependence, unspecified, uncomplicated: Secondary | ICD-10-CM | POA: Diagnosis not present

## 2023-09-07 DIAGNOSIS — R030 Elevated blood-pressure reading, without diagnosis of hypertension: Secondary | ICD-10-CM

## 2023-09-07 DIAGNOSIS — Z8673 Personal history of transient ischemic attack (TIA), and cerebral infarction without residual deficits: Secondary | ICD-10-CM

## 2023-09-07 DIAGNOSIS — F17210 Nicotine dependence, cigarettes, uncomplicated: Secondary | ICD-10-CM

## 2023-09-07 DIAGNOSIS — I63312 Cerebral infarction due to thrombosis of left middle cerebral artery: Secondary | ICD-10-CM

## 2023-09-07 DIAGNOSIS — E041 Nontoxic single thyroid nodule: Secondary | ICD-10-CM | POA: Diagnosis not present

## 2023-09-07 DIAGNOSIS — E7800 Pure hypercholesterolemia, unspecified: Secondary | ICD-10-CM

## 2023-09-07 LAB — COMPREHENSIVE METABOLIC PANEL WITH GFR
ALT: 18 U/L (ref 0–35)
AST: 21 U/L (ref 0–37)
Albumin: 4.3 g/dL (ref 3.5–5.2)
Alkaline Phosphatase: 93 U/L (ref 39–117)
BUN: 14 mg/dL (ref 6–23)
CO2: 24 meq/L (ref 19–32)
Calcium: 9.5 mg/dL (ref 8.4–10.5)
Chloride: 107 meq/L (ref 96–112)
Creatinine, Ser: 0.62 mg/dL (ref 0.40–1.20)
GFR: 93.96 mL/min (ref >60.00)
Glucose, Bld: 93 mg/dL (ref 70–99)
Potassium: 3.7 meq/L (ref 3.5–5.1)
Sodium: 139 meq/L (ref 135–145)
Total Bilirubin: 0.5 mg/dL (ref 0.2–1.2)
Total Protein: 7.2 g/dL (ref 6.0–8.3)

## 2023-09-07 LAB — CBC WITH DIFFERENTIAL/PLATELET
Basophils Absolute: 0 10*3/uL (ref 0.0–0.1)
Basophils Relative: 0.3 % (ref 0.0–3.0)
Eosinophils Absolute: 0.3 10*3/uL (ref 0.0–0.7)
Eosinophils Relative: 3.2 % (ref 0.0–5.0)
HCT: 41.3 % (ref 36.0–46.0)
Hemoglobin: 13.7 g/dL (ref 12.0–15.0)
Lymphocytes Relative: 24.1 % (ref 12.0–46.0)
Lymphs Abs: 2 10*3/uL (ref 0.7–4.0)
MCHC: 33.2 g/dL (ref 30.0–36.0)
MCV: 88.9 fl (ref 78.0–100.0)
Monocytes Absolute: 0.5 10*3/uL (ref 0.1–1.0)
Monocytes Relative: 6.2 % (ref 3.0–12.0)
Neutro Abs: 5.5 10*3/uL (ref 1.4–7.7)
Neutrophils Relative %: 66.2 % (ref 43.0–77.0)
Platelets: 296 10*3/uL (ref 150.0–400.0)
RBC: 4.65 Mil/uL (ref 3.87–5.11)
RDW: 14.6 % (ref 11.5–15.5)
WBC: 8.4 10*3/uL (ref 4.0–10.5)

## 2023-09-07 NOTE — Patient Instructions (Signed)
 Please go downstairs for labs before you leave today.  I ordered a thyroid  ultrasound and you will receive a call to schedule this.  Start checking your blood pressure at home.  Goal blood pressure is 130/80 or lower.  Cut back on salt canned foods high in sodium.  See the DASH diet handout.  Cut back on foods high in fat, fried foods and animal products in general.  See the handout regarding cholesterol  Continue your current medications as prescribed.  When the Plavix  prescription runs out, you will just continue on the aspirin .   Please follow-up with Dr. Nicolette Barrio, your primary care provider, in the next 4 to 5 weeks.  Bring in your blood pressure readings from home to this visit. Come to the visit fasting for at least 8 hours but drink water prior to the visit.

## 2023-09-07 NOTE — Progress Notes (Signed)
 Subjective:     Patient ID: [name], female    DOB: Sep 04, 1960, 63 y.o.   MRN: [id number]  Chief Complaint  Patient presents with   Hospitalization Follow-up    D/c 09/02/23, Stroke. States doing alright for the most part but still doesn't feel quite normal. Brain fog and handwritting still off. Scared its gonna happen again.   Wants to see if we can switch asprin to baby asprin    HPI  History of Present Illness         She is here to be seen for hospital discharge follow-up.  Dr. Nicolette Barrio is her PCP.  This is my first time seeing this patient.  Admit date: 09/01/2023 Discharge date: 09/02/2023  Hospitalized for acute stroke.   States she has brain fog and her handwriting is not back to normal. Feels tired and having headaches.   States she has been resting since she was discharged home.   She drove herself here today.    Recommendations for Outpatient Follow-up:  Follow outpatient CBC/CMP  Follow with neurology outpatient Follow blood pressure outpatient - expect she'll need antihypertensive at some point, fluctuating widely here Encourage smoking cessation Significantly elevated LDL, follow outpatient 2.1 cm thyroid  nodule needs follow up ultrasound 1.9x1.6 cm mass at L level I station needs follow up CT contrast enhanced neck outpatient   Presented to the ED with right-sided weakness, slurred speech and coordination issues.  Symptoms improved prior to discharge.  She was evaluated by neurology and started on statin, Plavix  and aspirin    Recommendation was DAPT x 3 weeks and then aspirin  alone  Recommended neurology/stroke clinic follow up at [facility]. Scheduled for July 10th.   Blood pressure fluctuated during hospitalization.  She has not been on medication for hypertension.  Smoking-reports smoking half pack of cigarettes per day on average for at least 40 years.  She declined nicotine replacement therapy at the hospital but she has ordered patches and  lozenges and plans to work on stopping smoking   Incidental thyroid  nodule seen on CT - 2.1 cm nodule within the right thyroid  lobe.   Abnormal finding -  1.9 x 1.6 cm low-attenuation mass at the left level I station, indeterminate but unchanged from the prior maxillofacial CT of 08/25/2013 and likely benign.     Health Maintenance Due  Topic Date Due   Pneumococcal Vaccine 21-74 Years old (1 of 2 - PCV) Never done   Zoster Vaccines- Shingrix (1 of 2) Never done   MAMMOGRAM  08/30/2015   Cervical Cancer Screening (HPV/Pap Cotest)  06/20/2017   DTaP/Tdap/Td (2 - Td or Tdap) 06/30/2022   COVID-19 Vaccine (1 - 2024-25 season) Never done   Fecal DNA (Cologuard)  05/22/2023    Past Medical History:  Diagnosis Date   Anxiety    Arthritis    Back pain    Bipolar affective disorder (HCC)    Depression    Genital herpes    History of chicken pox    Hypothyroidism     Past Surgical History:  Procedure Laterality Date   CESAREAN SECTION  1993   COLONOSCOPY  2004   neg   HEMORRHOID SURGERY  2006   LAPAROSCOPIC SALPINGO OOPHERECTOMY Left 12/19/2014   Procedure: LAPAROSCOPIC LEFT SALPINGO OOPHORECTOMY;  Surgeon: Cyd Dowse, MD;  Location: [facility];  Service: Gynecology;  Laterality: Left;   LAPAROSCOPY N/A 12/19/2014   Procedure: LAPAROSCOPY OPERATIVE;  Surgeon: Cyd Dowse, MD;  Location: [facility];  Service: Gynecology;  Laterality: N/A;   TONSILLECTOMY  1981   TUBAL LIGATION  1994    Family History  Problem Relation Age of Onset   Arthritis Mother    Alcohol abuse Sister    Cancer Neg Hx    Diabetes Neg Hx    Stroke Neg Hx    Heart disease Neg Hx     Social History   Socioeconomic History   Marital status: Divorced    Spouse name: Not on file   Number of children: 1   Years of education: 12   Highest education level: Not on file  Occupational History   Occupation: Retired  Tobacco Use   Smoking status: Every Day    Current packs/day: 0.50    Average packs/day:  0.5 packs/day for 33.0 years (16.5 ttl pk-yrs)    Types: Cigarettes   Smokeless tobacco: Never   Tobacco comments:    smoked 1983- present , up to 1/3 ppd  Vaping Use   Vaping status: Never Used  Substance and Sexual Activity   Alcohol use: Yes   Drug use: No   Sexual activity: Yes  Other Topics Concern   Not on file  Social History Narrative   Regular exercise-noCaffeine Use-yes      Lives alone with 2 dogs.   Social Drivers of Health   Financial Resource Strain: Medium Risk (06/23/2023)   Overall Financial Resource Strain (CARDIA)    Difficulty of Paying Living Expenses: Somewhat hard  Food Insecurity: No Food Insecurity (09/02/2023)   Hunger Vital Sign    Worried About Running Out of Food in the Last Year: Never true    Ran Out of Food in the Last Year: Never true  Transportation Needs: No Transportation Needs (09/02/2023)   PRAPARE - Administrator, Civil Service (Medical): No    Lack of Transportation (Non-Medical): No  Physical Activity: Inactive (06/23/2023)   Exercise Vital Sign    Days of Exercise per Week: 0 days    Minutes of Exercise per Session: 0 min  Stress: Stress Concern Present (06/23/2023)   [employer] of Occupational Health - Occupational Stress Questionnaire    Feeling of Stress : Rather much  Social Connections: Moderately Integrated (06/23/2023)   Social Connection and Isolation Panel    Frequency of Communication with Friends and Family: More than three times a week    Frequency of Social Gatherings with Friends and Family: More than three times a week    Attends Religious Services: More than 4 times per year    Active Member of [employer] or Organizations: Yes    Attends Banker Meetings: Never    Marital Status: Divorced  Catering manager Violence: Patient Unable To Answer (06/23/2023)   Humiliation, Afraid, Rape, and Kick questionnaire    Fear of Current or Ex-Partner: Patient unable to answer    Emotionally Abused: Patient  unable to answer    Physically Abused: Patient unable to answer    Sexually Abused: Patient unable to answer    Outpatient Medications Prior to Visit  Medication Sig Dispense Refill   ascorbic acid (VITAMIN C) 500 MG tablet Take 500 mg by mouth daily.     aspirin  EC 81 MG tablet Take 1 tablet (81 mg total) by mouth daily. Swallow whole. 30 tablet 12   atorvastatin  (LIPITOR ) 80 MG tablet Take 1 tablet (80 mg total) by mouth daily. 90 tablet 3   cholecalciferol (VITAMIN D3) 25 MCG (1000 UNIT) tablet Take 1,000 Units by mouth  daily.     clopidogrel  (PLAVIX ) 75 MG tablet Take 1 tablet (75 mg total) by mouth daily for 19 days. 19 tablet 0   diclofenac  Sodium (VOLTAREN ) 1 % GEL Apply 2 g topically 4 (four) times daily. (Patient taking differently: Apply 2 g topically 2 (two) times daily as needed (shoulder pain).) 100 g 3   EPINEPHrine  (EPIPEN  2-PAK) 0.3 mg/0.3 mL IJ SOAJ injection Inject 0.3 mg into the muscle as needed for anaphylaxis. 1 each 0   Oregano-Flaxseed Oil (OREGANO OIL-FLAXSEED OIL PO) Take 1 capsule by mouth daily.     Zinc Acetate, Oral, (ZINC ACETATE PO) Take 1 tablet by mouth daily.     No facility-administered medications prior to visit.    Allergies  Allergen Reactions   Bee Venom Anaphylaxis    Review of Systems  Constitutional:  Positive for malaise/fatigue. Negative for chills and fever.  HENT:  Negative for congestion, ear pain, sinus pain and sore throat.   Eyes:  Negative for blurred vision, double vision and pain.  Respiratory:  Negative for cough, shortness of breath and wheezing.   Cardiovascular:  Negative for chest pain, palpitations and leg swelling.  Gastrointestinal:  Negative for abdominal pain, constipation, diarrhea, nausea and vomiting.  Musculoskeletal:  Negative for joint pain and myalgias.  Skin:  Negative for rash.  Neurological:  Positive for headaches. Negative for dizziness, tingling, focal weakness and loss of consciousness.   Psychiatric/Behavioral:  Negative for depression and memory loss. The patient is nervous/anxious.         Objective:    Physical Exam Constitutional:      General: She is not in acute distress.    Appearance: She is not ill-appearing.  HENT:     Nose: Nose normal.     Mouth/Throat:     Lips: Pink.     Mouth: Mucous membranes are moist.     Tongue: Tongue does not deviate from midline.     Pharynx: Oropharynx is clear. Uvula midline.   Eyes:     General: Lids are normal. No visual field deficit.    Extraocular Movements: Extraocular movements intact.     Right eye: Normal extraocular motion.     Left eye: Normal extraocular motion.     Conjunctiva/sclera: Conjunctivae normal.   Neck:     Thyroid : No thyroid  mass, thyromegaly or thyroid  tenderness.   Cardiovascular:     Rate and Rhythm: Normal rate and regular rhythm.  Pulmonary:     Effort: Pulmonary effort is normal.     Breath sounds: Normal breath sounds.   Musculoskeletal:        General: Normal range of motion.     Cervical back: Normal range of motion and neck supple.     Right lower leg: No edema.     Left lower leg: No edema.  Lymphadenopathy:     Cervical: No cervical adenopathy.   Skin:    General: Skin is warm and dry.   Neurological:     General: No focal deficit present.     Mental Status: She is alert and oriented to person, place, and time.     Cranial Nerves: No cranial nerve deficit.     Sensory: No sensory deficit.     Motor: No weakness, tremor or pronator drift.     Coordination: Coordination is intact. Romberg sign negative. Coordination normal.     Gait: Gait normal.   Psychiatric:        Attention and Perception: Attention normal.  Mood and Affect: Mood normal.        Speech: Speech normal.        Behavior: Behavior normal.        Thought Content: Thought content normal.      BP 132/76 (BP Location: Left Arm, Patient Position: Sitting)   Pulse 84   Temp 97.8 F (36.6 C)  (Temporal)   Ht 5' 5 (1.651 m)   Wt 195 lb (88.5 kg)   SpO2 97%   BMI 32.45 kg/m  Wt Readings from Last 3 Encounters:  09/07/23 195 lb (88.5 kg)  09/01/23 197 lb 15.6 oz (89.8 kg)  06/23/23 198 lb (89.8 kg)       Assessment & Plan:   Problem List Items Addressed This Visit     CVA (cerebral vascular accident) (HCC)   Relevant Orders   CBC with Differential/Platelet (Completed)   Comprehensive metabolic panel with GFR (Completed)   Elevated blood pressure reading   Thyroid  nodule - Primary   Relevant Orders   US  THYROID    Tobacco use disorder   Other Visit Diagnoses       Hospital discharge follow-up         Pure hypercholesterolemia       Relevant Orders   CBC with Differential/Platelet (Completed)   Comprehensive metabolic panel with GFR (Completed)      Here for hospital discharge follow up for acute CVA. She drove herself to this appt. She reports feeling close to baseline. 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  Reviewed hospital summary, lab and imaging results as well as reconciled medications.  Acute CVA- She will complete 3 week course of Plavix  and ASA 81 mg and then stay on ASA 81 mg only per neurology recommendation. She will follow up with neurology in July as scheduled.  BP in goal range today. Advised her to get a BP machine and start checking BP at home daily for the next 4-5 weeks. DASH diet handout provided. Follow up with PCP and bring in readings.  HLD- continue high dose statin therapy. Counseling on effects of uncontrolled HLD. Recommend low fat, low cholesterol diet. Handout provided.  Smoking cessation counseling done. Discussed increase health dangers r/t smoking. She ordered nicotine replacement therapy online apparently and plans to start these soon.  Incidental thyroid  nodule seen on CT. Thyroid  US  ordered.  Mass - She prefers to not have a repeat CT since the mass is unchanged. Will follow up with PCP in 4-5 weeks.   I am  having [name] maintain her EPINEPHrine , diclofenac  Sodium, Oregano-Flaxseed Oil (OREGANO OIL-FLAXSEED OIL PO), ascorbic acid, cholecalciferol, (Zinc Acetate, Oral, (ZINC ACETATE PO)), aspirin  EC, atorvastatin , and clopidogrel .  No orders of the defined types were placed in this encounter.

## 2023-09-10 ENCOUNTER — Ambulatory Visit
Admission: RE | Admit: 2023-09-10 | Discharge: 2023-09-10 | Disposition: A | Discharge status: Home / Self Care | Source: Ambulatory Visit | Attending: Family Medicine | Admitting: Family Medicine

## 2023-09-10 DIAGNOSIS — E041 Nontoxic single thyroid nodule: Secondary | ICD-10-CM

## 2023-09-12 ENCOUNTER — Other Ambulatory Visit: Payer: Self-pay | Admitting: Family Medicine

## 2023-09-12 DIAGNOSIS — E041 Nontoxic single thyroid nodule: Secondary | ICD-10-CM

## 2023-09-12 NOTE — Progress Notes (Signed)
 Chiquita,  Please let her know that her thyroid  nodule meets criteria for biopsy per her ultrasound. I placed the order for this and she should hear from someone to get this scheduled. Let us  know if she does not hear anything in the next 2 weeks. Thanks.  Anothony Bursch

## 2023-09-13 DIAGNOSIS — Z1211 Encounter for screening for malignant neoplasm of colon: Secondary | ICD-10-CM | POA: Diagnosis not present

## 2023-09-13 DIAGNOSIS — Z1212 Encounter for screening for malignant neoplasm of rectum: Secondary | ICD-10-CM | POA: Diagnosis not present

## 2023-09-15 NOTE — Telephone Encounter (Signed)
 Copied from CRM [id number]. Topic: Clinical - Lab/Test Results >> Sep 15, 2023  9:34 AM [place]ique E wrote: Reason for CRM: Patient was returning a call from [facility] in regards to imaging results. Callback number for patient is [phone].

## 2023-09-17 ENCOUNTER — Ambulatory Visit
Admission: RE | Admit: 2023-09-17 | Discharge: 2023-09-17 | Disposition: A | Discharge status: Home / Self Care | Source: Ambulatory Visit | Attending: Family Medicine | Admitting: Family Medicine

## 2023-09-17 ENCOUNTER — Other Ambulatory Visit (HOSPITAL_COMMUNITY)
Admission: RE | Admit: 2023-09-17 | Discharge: 2023-09-17 | Disposition: A | Discharge status: Home / Self Care | Source: Ambulatory Visit | Attending: Family Medicine | Admitting: Family Medicine

## 2023-09-17 DIAGNOSIS — E041 Nontoxic single thyroid nodule: Secondary | ICD-10-CM | POA: Diagnosis not present

## 2023-09-17 LAB — COLOGUARD: COLOGUARD: NEGATIVE

## 2023-09-21 ENCOUNTER — Ambulatory Visit: Payer: Self-pay | Admitting: Family Medicine

## 2023-09-21 LAB — CYTOLOGY - NON PAP

## 2023-09-30 ENCOUNTER — Ambulatory Visit: Admitting: Adult Health

## 2023-09-30 ENCOUNTER — Encounter: Payer: Self-pay | Admitting: Adult Health

## 2023-09-30 VITALS — BP 132/67 | HR 77 | Ht 65.0 in | Wt 197.0 lb

## 2023-09-30 DIAGNOSIS — I639 Cerebral infarction, unspecified: Secondary | ICD-10-CM

## 2023-09-30 DIAGNOSIS — F17200 Nicotine dependence, unspecified, uncomplicated: Secondary | ICD-10-CM | POA: Diagnosis not present

## 2023-09-30 DIAGNOSIS — R413 Other amnesia: Secondary | ICD-10-CM | POA: Diagnosis not present

## 2023-09-30 DIAGNOSIS — E785 Hyperlipidemia, unspecified: Secondary | ICD-10-CM

## 2023-09-30 NOTE — Progress Notes (Signed)
 I agree with the above plan

## 2023-09-30 NOTE — Patient Instructions (Addendum)
 Your Plan:  Continue ASA   Blood pressure goal <130/90 Cholesterol LDL goal <70 Diabetes goal A1c <7 Memory score is 30/30- good Discuss with PCP about smoking cessation Monitor diet and try to exercise   Thank you for coming to see us  at [facility]. I hope we have been able to provide you high quality care today.  You may receive a patient satisfaction survey over the next few weeks. We would appreciate your feedback and comments so that we may continue to improve ourselves and the health of our patients.

## 2023-09-30 NOTE — Progress Notes (Signed)
 PATIENT: [name] DOB: Sep 13, 1960  REASON FOR VISIT: follow up HISTORY FROM: patient PRIMARY NEUROLOGIST: Dr. Rosemarie  Chief Complaint  Patient presents with   Hospitalization Follow-up    RM7, alone Pt stated--loss memory few minutes, constant headache.     HISTORY OF PRESENT ILLNESS: Today 09/30/23   [name] is a 63 y.o. female here for hospital follow-up for left PLIC infarct, etiology:  likely small vessel disease in patient with multiple risk factors.  returns today for follow-up.  Reports that since her stroke she has continued to have a daily headache.  Does not typically wake up with a headache.  The headache will develop throughout the day.  Reports that it is located across the forehead.  Denies photophobia, phonophobia, nausea, vomiting.  She states that she takes Tylenol  but it does not offer much benefit.  Reports that she was told she cannot have NSAIDs.  She has noticed that since her stroke she has had a little brain farts.  She is able to complete all ADLs independently.  She is now just on aspirin .  She has followed up with her PCP.  She continues on Lipitor  for her cholesterol although she does state that she would like to eventually come off of this and try natural ways to control her cholesterol.  Hemoglobin A1c was in normal range.  Patient reports that she has never been told she snores.  Does not typically wake up tired or fatigued.  She does continue to smoke cigarettes daily.  She states that she contacted some online number for smoking cessation and they sent her nicotine patches but it increased her blood pressure.  Returns today for an evaluation.   Imaging:   MRI brain w/wo contrast IMPRESSION: 1. 9 mm acute infarct within the posterior limb of left internal capsule. 2. Background T2 FLAIR hyperintense signal changes within the cerebral white matter, nonspecific but most often secondary to chronic small vessel ischemia. 3.  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 4. Mild-to-moderate ethmoid sinus mucosal thickening, bilaterally.  CTA head/neck:  IMPRESSION: CTA neck:   1. Common carotid and internal carotid arteries patent within the neck without stenosis. Mild atherosclerotic plaque bilaterally, as described. 2. Vertebral arteries codominant and patent within the neck without stenosis or significant atherosclerotic disease. 3. Aortic Atherosclerosis (ICD10-I70.0). 4. 2.1 cm right thyroid  lobe nodule. A non-emergent thyroid  ultrasound is recommended for further evaluation. Reference: J Am Coll Radiol. 2015 Feb;12(2): 143-50. 5. 1.9 x 1.6 cm low attenuation mass at the left level I station, indeterminate but unchanged from the prior maxillofacial CT of 08/25/2013 and likely benign. Consider a non-emergent contrast-enhanced neck CT for further characterization.   CTA head:   1. No proximal intracranial large vessel occlusion identified. 2. Intracranial atherosclerotic disease as described. Most notably, there is a moderate-to-severe stenosis within a right PCA branch at the P2/P3 junction.    HISTORY (copied from Hospital)[name] is a 63 y.o. female with history of migraines, hypothyroidism, anxiety/depression, bipolar who was brought in by EMS as a code stroke 6/11 due to headache, right side weakness, slurred speech.  MRI identified left internal capsule acute infarct, admitted for full stroke workup.  NIH on admission: 4    Stroke:  left PLIC infarct, etiology:  likely small vessel disease in patient with multiple risk factors  Code Stroke CT head No acute infarct or intracranial hemorrhage CTA head & neck Moderate to severe stenosis right PCA  P2/P3 junction CT perfusion unremarkable MRI  9 mm acute infarct posterior left internal capsule 2D Echo: 65 to 70% EF, trivial MVR, mild AVR, negative bubble study LDL 188 HgbA1c 5.2 VTE  prophylaxis - lovenox  No antithrombotic prior to admission, now on aspirin  81 mg daily and clopidogrel  75 mg daily for 3 weeks and then aspirin  alone. Therapy recommendations:  No follow up needed  Disposition:  likely home 6/12   Hypertension Home meds:  none Stable Long-term BP goal normotensive   Hyperlipidemia Home meds:  none LDL 188, goal < 70 Add Lipitor  80mg   Continue statin at discharge   Tobacco Abuse Patient smokes 0.5 packs per day for 33 years       Ready to quit? Yes Nicotine replacement therapy provided   Other Stroke Risk Factors Obesity, Body mass index is 32.94 kg/m., BMI >/= 30 associated with increased stroke risk, recommend weight loss, diet and exercise as appropriate  Migraine    REVIEW OF SYSTEMS: Out of a complete 14 system review of symptoms, the patient complains only of the following symptoms, and all other reviewed systems are negative.  ALLERGIES: Allergies  Allergen Reactions   Bee Venom Anaphylaxis    HOME MEDICATIONS: Outpatient Medications Prior to Visit  Medication Sig Dispense Refill   ascorbic acid (VITAMIN C) 500 MG tablet Take 500 mg by mouth daily.     aspirin  EC 81 MG tablet Take 1 tablet (81 mg total) by mouth daily. Swallow whole. 30 tablet 12   atorvastatin  (LIPITOR ) 80 MG tablet Take 1 tablet (80 mg total) by mouth daily. 90 tablet 3   cholecalciferol (VITAMIN D3) 25 MCG (1000 UNIT) tablet Take 1,000 Units by mouth daily.     diclofenac  Sodium (VOLTAREN ) 1 % GEL Apply 2 g topically 4 (four) times daily. 100 g 3   EPINEPHrine  (EPIPEN  2-PAK) 0.3 mg/0.3 mL IJ SOAJ injection Inject 0.3 mg into the muscle as needed for anaphylaxis. 1 each 0   Oregano-Flaxseed Oil (OREGANO OIL-FLAXSEED OIL PO) Take 1 capsule by mouth daily.     Zinc Acetate, Oral, (ZINC ACETATE PO) Take 1 tablet by mouth daily.     No facility-administered medications prior to visit.    PAST MEDICAL HISTORY: Past Medical History:  Diagnosis Date   Anxiety     Arthritis    Back pain    Bipolar affective disorder (HCC)    Depression    Genital herpes    History of chicken pox    Hypothyroidism     PAST SURGICAL HISTORY: Past Surgical History:  Procedure Laterality Date   CESAREAN SECTION  1993   COLONOSCOPY  2004   neg   HEMORRHOID SURGERY  2006   LAPAROSCOPIC SALPINGO OOPHERECTOMY Left 12/19/2014   Procedure: LAPAROSCOPIC LEFT SALPINGO OOPHORECTOMY;  Surgeon: Krystal Deaner, MD;  Location: [facility];  Service: Gynecology;  Laterality: Left;   LAPAROSCOPY N/A 12/19/2014   Procedure: LAPAROSCOPY OPERATIVE;  Surgeon: Krystal Deaner, MD;  Location: [facility];  Service: Gynecology;  Laterality: N/A;   TONSILLECTOMY  1981   TUBAL LIGATION  1994    FAMILY HISTORY: Family History  Problem Relation Age of Onset   Arthritis Mother    Alcohol abuse Sister    Cancer Neg Hx    Diabetes Neg Hx    Stroke Neg Hx    Heart disease Neg Hx     SOCIAL HISTORY: Social History   Socioeconomic History   Marital status: Divorced    Spouse name: Not on  file   Number of children: 1   Years of education: 12   Highest education level: Not on file  Occupational History   Occupation: Retired  Tobacco Use   Smoking status: Every Day    Current packs/day: 0.50    Average packs/day: 0.5 packs/day for 33.0 years (16.5 ttl pk-yrs)    Types: Cigarettes   Smokeless tobacco: Never   Tobacco comments:    smoked 1983- present , up to 1/3 ppd  Vaping Use   Vaping status: Never Used  Substance and Sexual Activity   Alcohol use: Yes   Drug use: No   Sexual activity: Yes  Other Topics Concern   Not on file  Social History Narrative   Regular exercise-noCaffeine Use-yes      Lives alone with 2 dogs.   Social Drivers of Health   Financial Resource Strain: Medium Risk (06/23/2023)   Overall Financial Resource Strain (CARDIA)    Difficulty of Paying Living Expenses: Somewhat hard  Food Insecurity: No Food Insecurity (09/02/2023)   Hunger Vital Sign     Worried About Running Out of Food in the Last Year: Never true    Ran Out of Food in the Last Year: Never true  Transportation Needs: No Transportation Needs (09/02/2023)   PRAPARE - Administrator, Civil Service (Medical): No    Lack of Transportation (Non-Medical): No  Physical Activity: Inactive (06/23/2023)   Exercise Vital Sign    Days of Exercise per Week: 0 days    Minutes of Exercise per Session: 0 min  Stress: Stress Concern Present (06/23/2023)   [employer] of Occupational Health - Occupational Stress Questionnaire    Feeling of Stress : Rather much  Social Connections: Moderately Integrated (06/23/2023)   Social Connection and Isolation Panel    Frequency of Communication with Friends and Family: More than three times a week    Frequency of Social Gatherings with Friends and Family: More than three times a week    Attends Religious Services: More than 4 times per year    Active Member of [employer] or Organizations: Yes    Attends Banker Meetings: Never    Marital Status: Divorced  Catering manager Violence: Patient Unable To Answer (06/23/2023)   Humiliation, Afraid, Rape, and Kick questionnaire    Fear of Current or Ex-Partner: Patient unable to answer    Emotionally Abused: Patient unable to answer    Physically Abused: Patient unable to answer    Sexually Abused: Patient unable to answer      PHYSICAL EXAM  Vitals:   09/30/23 0925  BP: 132/67  Pulse: 77  Weight: 197 lb (89.4 kg)  Height: 5' 5 (1.651 m)   Body mass index is 32.78 kg/m.     09/30/2023   10:21 AM  MMSE - Mini Mental State Exam  Orientation to time 5  Orientation to Place 5  Registration 3  Attention/ Calculation 5  Recall 3  Language- name 2 objects 2  Language- repeat 1  Language- follow 3 step command 3  Language- read & follow direction 1  Write a sentence 1  Copy design 1  Total score 30     Generalized: Well developed, in no acute distress    Neurological examination  Mentation: Alert oriented to time, place, history taking. Follows all commands speech and language fluent Cranial nerve II-XII: Pupils were equal round reactive to light. Extraocular movements were full, visual field were full on confrontational test. Facial sensation  and strength were normal. Uvula tongue midline. Head turning and shoulder shrug  were normal and symmetric. Motor: The motor testing reveals 5 over 5 strength of all 4 extremities. Good symmetric motor tone is noted throughout.  Sensory: Sensory testing is intact to soft touch on all 4 extremities. No evidence of extinction is noted.  Coordination: Cerebellar testing reveals good finger-nose-finger and heel-to-shin bilaterally.  Gait and station: Gait is normal. Tandem gait is normal. Romberg is negative. No drift is seen.  Reflexes: Deep tendon reflexes are symmetric and normal bilaterally.   DIAGNOSTIC DATA (LABS, IMAGING, TESTING) - I reviewed patient records, labs, notes, testing and imaging myself where available.  Lab Results  Component Value Date   WBC 8.4 09/07/2023   HGB 13.7 09/07/2023   HCT 41.3 09/07/2023   MCV 88.9 09/07/2023   PLT 296.0 09/07/2023      Component Value Date/Time   NA 139 09/07/2023 1155   K 3.7 09/07/2023 1155   CL 107 09/07/2023 1155   CO2 24 09/07/2023 1155   GLUCOSE 93 09/07/2023 1155   BUN 14 09/07/2023 1155   CREATININE 0.62 09/07/2023 1155   CALCIUM  9.5 09/07/2023 1155   PROT 7.2 09/07/2023 1155   ALBUMIN 4.3 09/07/2023 1155   AST 21 09/07/2023 1155   ALT 18 09/07/2023 1155   ALKPHOS 93 09/07/2023 1155   BILITOT 0.5 09/07/2023 1155   GFRNONAA >60 09/02/2023 0521   GFRAA 33 (L) 08/25/2013 1720   Lab Results  Component Value Date   CHOL 253 (H) 09/01/2023   HDL 42 09/01/2023   LDLCALC 188 (H) 09/01/2023   LDLDIRECT 140.0 09/05/2019   TRIG 113 09/01/2023   CHOLHDL 6.0 09/01/2023   Lab Results  Component Value Date   HGBA1C 5.2 09/01/2023    Lab Results  Component Value Date   VITAMINB12 324 10/08/2021   Lab Results  Component Value Date   TSH 3.274 09/01/2023      ASSESSMENT AND PLAN 63 y.o. year old female  has a past medical history of Anxiety, Arthritis, Back pain, Bipolar affective disorder (HCC), Depression, Genital herpes, History of chicken pox, and Hypothyroidism. here with:  left PLIC infarct, etiology:  likely small vessel disease in patient with multiple risk factors  Hyperlipidemia Memory disturbance   Continue aspirin  81 mg daily   Discussed secondary stroke prevention measures and importance of close PCP follow up for aggressive stroke risk factor management. I have gone over the pathophysiology of stroke, warning signs and symptoms, risk factors and their management in some detail with instructions to go to the closest emergency room for symptoms of concern. HTN: BP goal <130/90.   HLD: LDL goal <70. Recent LDL 188.  DMII: A1c goal<7.0. Recent A1c 5.2. Discussed with PCP about smoking cessation MMSE 30/30 Encouraged patient to monitor diet and encouraged exercise FU with our office 6-8 months or sooner if needed.       Duwaine Russell, MSN, NP-C 09/30/2023, 9:38 AM [facility] [address] [place], [place] [address] [phone]

## 2023-10-01 ENCOUNTER — Other Ambulatory Visit (HOSPITAL_COMMUNITY): Payer: Self-pay

## 2023-10-06 ENCOUNTER — Ambulatory Visit: Payer: Self-pay | Admitting: Internal Medicine

## 2023-10-06 ENCOUNTER — Encounter: Payer: Self-pay | Admitting: Internal Medicine

## 2023-10-06 VITALS — BP 126/68 | HR 65 | Temp 98.0°F | Ht 65.0 in | Wt 195.0 lb

## 2023-10-06 DIAGNOSIS — R030 Elevated blood-pressure reading, without diagnosis of hypertension: Secondary | ICD-10-CM | POA: Diagnosis not present

## 2023-10-06 DIAGNOSIS — Z8673 Personal history of transient ischemic attack (TIA), and cerebral infarction without residual deficits: Secondary | ICD-10-CM | POA: Diagnosis not present

## 2023-10-06 DIAGNOSIS — F319 Bipolar disorder, unspecified: Secondary | ICD-10-CM | POA: Diagnosis not present

## 2023-10-06 DIAGNOSIS — E041 Nontoxic single thyroid nodule: Secondary | ICD-10-CM | POA: Diagnosis not present

## 2023-10-06 DIAGNOSIS — E782 Mixed hyperlipidemia: Secondary | ICD-10-CM | POA: Diagnosis not present

## 2023-10-06 DIAGNOSIS — R7303 Prediabetes: Secondary | ICD-10-CM | POA: Diagnosis not present

## 2023-10-06 DIAGNOSIS — E66811 Obesity, class 1: Secondary | ICD-10-CM | POA: Diagnosis not present

## 2023-10-06 DIAGNOSIS — F17200 Nicotine dependence, unspecified, uncomplicated: Secondary | ICD-10-CM

## 2023-10-06 LAB — CBC
HCT: 41.4 % (ref 36.0–46.0)
Hemoglobin: 13.7 g/dL (ref 12.0–15.0)
MCHC: 33 g/dL (ref 30.0–36.0)
MCV: 90.1 fl (ref 78.0–100.0)
Platelets: 265 K/uL (ref 150.0–400.0)
RBC: 4.59 Mil/uL (ref 3.87–5.11)
RDW: 14.2 % (ref 11.5–15.5)
WBC: 6.8 K/uL (ref 4.0–10.5)

## 2023-10-06 LAB — COMPREHENSIVE METABOLIC PANEL WITH GFR
ALT: 14 U/L (ref 0–35)
AST: 17 U/L (ref 0–37)
Albumin: 4.3 g/dL (ref 3.5–5.2)
Alkaline Phosphatase: 119 U/L — ABNORMAL HIGH (ref 39–117)
BUN: 11 mg/dL (ref 6–23)
CO2: 27 meq/L (ref 19–32)
Calcium: 9.5 mg/dL (ref 8.4–10.5)
Chloride: 105 meq/L (ref 96–112)
Creatinine, Ser: 0.67 mg/dL (ref 0.40–1.20)
GFR: 92.17 mL/min (ref >60.00)
Glucose, Bld: 89 mg/dL (ref 70–99)
Potassium: 4.2 meq/L (ref 3.5–5.1)
Sodium: 140 meq/L (ref 135–145)
Total Bilirubin: 0.6 mg/dL (ref 0.2–1.2)
Total Protein: 7.1 g/dL (ref 6.0–8.3)

## 2023-10-06 LAB — LIPID PANEL
Cholesterol: 119 mg/dL (ref 0–200)
HDL: 36 mg/dL — ABNORMAL LOW (ref >39.00)
LDL Cholesterol: 66 mg/dL (ref 0–99)
NonHDL: 82.81
Total CHOL/HDL Ratio: 3
Triglycerides: 86 mg/dL (ref 0.0–149.0)
VLDL: 17.2 mg/dL (ref 0.0–40.0)

## 2023-10-06 MED ORDER — NICOTINE 14 MG/24HR TD PT24: MEDICATED_PATCH | TRANSDERMAL | 0 refills | Status: DC

## 2023-10-06 MED ORDER — NICOTINE 7 MG/24HR TD PT24: MEDICATED_PATCH | TRANSDERMAL | 0 refills | Status: DC

## 2023-10-06 NOTE — Progress Notes (Unsigned)
   Subjective:   Patient ID: [name], female    DOB: 1960/09/10, 63 y.o.   MRN: [id number]  HPI The patient is a 63 YO female coming in for post stroke and BP monitoring and cholesterol. Started on lipitor  80 mg in hospital and needs recheck. No side effects but she does not want to stay on long term. No residual from stroke. No new symptoms.   Review of Systems  Constitutional: Negative.   HENT: Negative.    Eyes: Negative.   Respiratory:  Negative for cough, chest tightness and shortness of breath.   Cardiovascular:  Negative for chest pain, palpitations and leg swelling.  Gastrointestinal:  Negative for abdominal distention, abdominal pain, constipation, diarrhea, nausea and vomiting.  Musculoskeletal: Negative.   Skin: Negative.   Neurological: Negative.   Psychiatric/Behavioral: Negative.      Objective:  Physical Exam Constitutional:      Appearance: She is well-developed.  HENT:     Head: Normocephalic and atraumatic.  Cardiovascular:     Rate and Rhythm: Normal rate and regular rhythm.  Pulmonary:     Effort: Pulmonary effort is normal. No respiratory distress.     Breath sounds: Normal breath sounds. No wheezing or rales.  Abdominal:     General: Bowel sounds are normal. There is no distension.     Palpations: Abdomen is soft.     Tenderness: There is no abdominal tenderness. There is no rebound.  Musculoskeletal:     Cervical back: Normal range of motion.  Skin:    General: Skin is warm and dry.  Neurological:     Mental Status: She is alert and oriented to person, place, and time.     Coordination: Coordination normal.     Vitals:   10/06/23 0840  BP: 126/68  Pulse: 65  Temp: 98 F (36.7 C)  TempSrc: Oral  SpO2: 98%  Weight: 195 lb (88.5 kg)  Height: 5' 5 (1.651 m)    Assessment & Plan:  Visit time 25 minutes in face to face communication with patient and coordination of care, additional 10 minutes spent in record review, coordination or  care, ordering tests, communicating/referring to other healthcare professionals, documenting in medical records all on the same day of the visit for total time 35 minutes spent on the visit.

## 2023-10-06 NOTE — Patient Instructions (Addendum)
 We will check the labs and refer you to the surgeon to talk about options for the weight loss and coverage.

## 2023-10-07 ENCOUNTER — Ambulatory Visit: Payer: Self-pay | Admitting: Internal Medicine

## 2023-10-07 NOTE — Assessment & Plan Note (Signed)
 BP normal today and on log. Will continue close monitoring due to recent stroke.

## 2023-10-07 NOTE — Assessment & Plan Note (Signed)
 Reviewed negative pathology from recent biopsy.

## 2023-10-07 NOTE — Assessment & Plan Note (Signed)
 Overall stable off meds today.

## 2023-10-07 NOTE — Assessment & Plan Note (Signed)
 WE counseled about quitting. She got free patches from hotline but they sent 24 mg which was too strong. Rx 14 mg and 7 mg patches nicotine  as she is ready for attempt. Follow up in 3 months at next visit.

## 2023-10-07 NOTE — Assessment & Plan Note (Signed)
 Counseled about recent results and diet changes.

## 2023-10-07 NOTE — Assessment & Plan Note (Signed)
 Did not have adequate screening for a fib. She was in hospital less than 24 hours and no follow up holter or loop. She is not interested in pursuing now as she feels well. She is taking statin. We had a lengthy conversation about need for statin long term and she agrees to take for now. Checking lipid panel and CMP today.

## 2023-12-02 ENCOUNTER — Telehealth: Payer: Self-pay

## 2023-12-02 NOTE — Telephone Encounter (Signed)
 I did request her record the day of the visit but still have not received anything but I did request again

## 2023-12-02 NOTE — Telephone Encounter (Signed)
 Copied from CRM [id number]. Topic: General - Other >> Dec 02, 2023 10:10 AM DeAngela L wrote: Reason for CRM: patient calling cause she is still receiving emails fro [facility] about getting her mammogram and the patient states she has already had her mammogram and would like for the system to be updated to reflect she has already had one so the emails can stop, she said she spoke with someone in the office about fixing this previously   Pt num [phone] (M)

## 2024-02-09 ENCOUNTER — Ambulatory Visit: Admitting: Internal Medicine

## 2024-02-09 ENCOUNTER — Telehealth: Payer: Self-pay

## 2024-02-09 ENCOUNTER — Encounter: Payer: Self-pay | Admitting: Internal Medicine

## 2024-02-09 VITALS — BP 138/70 | HR 79 | Temp 98.2°F | Ht 65.0 in | Wt 199.0 lb

## 2024-02-09 DIAGNOSIS — F17200 Nicotine dependence, unspecified, uncomplicated: Secondary | ICD-10-CM

## 2024-02-09 DIAGNOSIS — R7303 Prediabetes: Secondary | ICD-10-CM | POA: Diagnosis not present

## 2024-02-09 DIAGNOSIS — Z8673 Personal history of transient ischemic attack (TIA), and cerebral infarction without residual deficits: Secondary | ICD-10-CM

## 2024-02-09 DIAGNOSIS — M791 Myalgia, unspecified site: Secondary | ICD-10-CM | POA: Diagnosis not present

## 2024-02-09 LAB — COMPREHENSIVE METABOLIC PANEL WITH GFR
ALT: 15 U/L (ref 0–35)
AST: 22 U/L (ref 0–37)
Albumin: 4 g/dL (ref 3.5–5.2)
Alkaline Phosphatase: 109 U/L (ref 39–117)
BUN: 17 mg/dL (ref 6–23)
CO2: 24 meq/L (ref 19–32)
Calcium: 9.1 mg/dL (ref 8.4–10.5)
Chloride: 109 meq/L (ref 96–112)
Creatinine, Ser: 0.87 mg/dL (ref 0.40–1.20)
GFR: 70.09 mL/min (ref >60.00)
Glucose, Bld: 107 mg/dL — ABNORMAL HIGH (ref 70–99)
Potassium: 3.8 meq/L (ref 3.5–5.1)
Sodium: 140 meq/L (ref 135–145)
Total Bilirubin: 0.4 mg/dL (ref 0.2–1.2)
Total Protein: 6.8 g/dL (ref 6.0–8.3)

## 2024-02-09 LAB — CK: Total CK: 203 U/L — ABNORMAL HIGH (ref 17–177)

## 2024-02-09 LAB — CBC
HCT: 39.5 % (ref 36.0–46.0)
Hemoglobin: 13.1 g/dL (ref 12.0–15.0)
MCHC: 33.1 g/dL (ref 30.0–36.0)
MCV: 90.5 fl (ref 78.0–100.0)
Platelets: 250 K/uL (ref 150.0–400.0)
RBC: 4.36 Mil/uL (ref 3.87–5.11)
RDW: 13.5 % (ref 11.5–15.5)
WBC: 7.8 K/uL (ref 4.0–10.5)

## 2024-02-09 LAB — HEMOGLOBIN A1C: Hgb A1c MFr Bld: 5.7 % (ref 4.6–6.5)

## 2024-02-09 LAB — LIPID PANEL
Cholesterol: 109 mg/dL (ref 0–200)
HDL: 36.4 mg/dL — ABNORMAL LOW (ref >39.00)
LDL Cholesterol: 52 mg/dL (ref 0–99)
NonHDL: 72.11
Total CHOL/HDL Ratio: 3
Triglycerides: 100 mg/dL (ref 0.0–149.0)
VLDL: 20 mg/dL (ref 0.0–40.0)

## 2024-02-09 LAB — MAGNESIUM: Magnesium: 1.9 mg/dL (ref 1.5–2.5)

## 2024-02-09 NOTE — Progress Notes (Signed)
 Subjective:   Patient ID: [name], female    DOB: 06-Feb-1961, 63 y.o.   MRN: [id number]  Discussed the use of AI scribe software for clinical note transcription with the patient, who gave verbal consent to proceed.  History of Present Illness [name] is a 63 year old female with a history of stroke who presents with muscle cramps  She experiences severe cramps in her legs and hands, which began after her stroke. She takes magnesium to alleviate them but recently had a severe cramp in her leg and foot that required her to get out of bed. She suspects taking magnesium too early in the day may have contributed to this episode. She is concerned that her cholesterol medication might be causing muscle cramps.  She is concerned about her weight, attributing it to lifestyle changes following her mother's passing. She follows a keto diet but struggles to maintain it due to her work schedule. She admits to eating junk food, particularly potato chips.  She has significantly reduced her smoking habit from a pack a day to a pack lasting three days or more. She managed to abstain from smoking during a recent trip with a non-smoking friend.  Review of Systems  Constitutional: Negative.   HENT: Negative.    Eyes: Negative.   Respiratory:  Negative for cough, chest tightness and shortness of breath.   Cardiovascular:  Negative for chest pain, palpitations and leg swelling.  Gastrointestinal:  Negative for abdominal distention, abdominal pain, constipation, diarrhea, nausea and vomiting.  Musculoskeletal:  Positive for myalgias.  Skin: Negative.   Neurological: Negative.   Psychiatric/Behavioral: Negative.      Objective:  Physical Exam Constitutional:      Appearance: She is well-developed.  HENT:     Head: Normocephalic and atraumatic.  Cardiovascular:     Rate and Rhythm: Normal rate and regular rhythm.  Pulmonary:     Effort: Pulmonary effort is normal. No  respiratory distress.     Breath sounds: Normal breath sounds. No wheezing or rales.  Abdominal:     General: Bowel sounds are normal. There is no distension.     Palpations: Abdomen is soft.     Tenderness: There is no abdominal tenderness.  Musculoskeletal:     Cervical back: Normal range of motion.  Skin:    General: Skin is warm and dry.  Neurological:     Mental Status: She is alert and oriented to person, place, and time.     Coordination: Coordination normal.     Vitals:   02/09/24 0801  BP: 138/70  Pulse: 79  Temp: 98.2 F (36.8 C)  TempSrc: Oral  SpO2: 97%  Weight: 199 lb (90.3 kg)  Height: 5' 5 (1.651 m)    Assessment and Plan Assessment & Plan Muscle cramps and myalgia   Intermittent cramps and myalgia may be linked to prolonged standing and magnesium timing, with Lipitor  as a possible side effect. Magnesium, calcium , and potassium levels were checked. Continue magnesium supplementation and adjust timing.  Hx completed stroke: Checking lipid panel and HgA1c as well as CMP and CK given new myalgias. She is taking statin and aspirin  81 mg daily. She states intention to stop statin when she runs out and counseled about its ability to lower her risk of future strokes.   Obesity   Weight management is challenging due to diet adherence issues, emotional eating, and grief. She is encouraged to reduce junk food availability and manage emotional eating.  Nicotine  dependence, cigarettes   Nicotine  dependence persists with reduced smoking frequency. A recent quit attempt was supported. Continued smoking reduction is encouraged, and the benefits of smoking cessation for stroke risk were discussed.

## 2024-02-09 NOTE — Telephone Encounter (Signed)
 Copied from CRM [id number]. Topic: General - Other >> Feb 09, 2024 10:06 AM Deaijah H wrote: Reason for CRM: Patient would like to ask Dr. 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 Would like to have it either mailed or can pick it up.

## 2024-02-09 NOTE — Assessment & Plan Note (Signed)
 Checking lipid panel and HgA1c as well as CMP and CK given new myalgias. She is taking statin and aspirin  81 mg daily. She states intention to stop statin when she runs out and counseled about its ability to lower her risk of future strokes.

## 2024-02-09 NOTE — Assessment & Plan Note (Signed)
 Intermittent cramps and myalgia may be linked to prolonged standing and magnesium timing, with Lipitor  as a possible side effect. Magnesium, calcium , and potassium levels were checked. Continue magnesium supplementation and adjust timing.

## 2024-02-09 NOTE — Patient Instructions (Signed)
 We will check the labs today to see if there is a cause for the muscle aches.

## 2024-02-09 NOTE — Assessment & Plan Note (Signed)
 Checking HgA1c and adjust as needed.

## 2024-02-09 NOTE — Assessment & Plan Note (Signed)
 Nicotine  dependence persists with reduced smoking frequency. A recent quit attempt was supported. Continued smoking reduction is encouraged, and the benefits of smoking cessation for stroke risk were discussed.

## 2024-02-11 ENCOUNTER — Ambulatory Visit: Payer: Self-pay | Admitting: Internal Medicine

## 2024-02-16 NOTE — Telephone Encounter (Signed)
 Per pt request, letter has been printed and mailed to home address.

## 2024-02-16 NOTE — Telephone Encounter (Signed)
 Note is done and available on mychart.

## 2024-04-12 ENCOUNTER — Other Ambulatory Visit: Payer: Self-pay | Admitting: Family Medicine

## 2024-04-12 ENCOUNTER — Ambulatory Visit: Payer: Self-pay | Admitting: Family Medicine

## 2024-04-12 ENCOUNTER — Ambulatory Visit: Admitting: Family Medicine

## 2024-04-12 ENCOUNTER — Ambulatory Visit: Payer: Self-pay

## 2024-04-12 ENCOUNTER — Encounter: Payer: Self-pay | Admitting: Family Medicine

## 2024-04-12 VITALS — BP 162/60 | HR 94 | Temp 97.9°F | Ht 65.0 in

## 2024-04-12 DIAGNOSIS — R030 Elevated blood-pressure reading, without diagnosis of hypertension: Secondary | ICD-10-CM | POA: Diagnosis not present

## 2024-04-12 DIAGNOSIS — Z8673 Personal history of transient ischemic attack (TIA), and cerebral infarction without residual deficits: Secondary | ICD-10-CM | POA: Diagnosis not present

## 2024-04-12 DIAGNOSIS — M25362 Other instability, left knee: Secondary | ICD-10-CM | POA: Diagnosis not present

## 2024-04-12 DIAGNOSIS — F17200 Nicotine dependence, unspecified, uncomplicated: Secondary | ICD-10-CM | POA: Diagnosis not present

## 2024-04-12 DIAGNOSIS — G44209 Tension-type headache, unspecified, not intractable: Secondary | ICD-10-CM | POA: Diagnosis not present

## 2024-04-12 DIAGNOSIS — E876 Hypokalemia: Secondary | ICD-10-CM

## 2024-04-12 LAB — CBC WITH DIFFERENTIAL/PLATELET
Basophils Absolute: 0.1 K/uL (ref 0.0–0.1)
Basophils Relative: 0.6 % (ref 0.0–3.0)
Eosinophils Absolute: 0.3 K/uL (ref 0.0–0.7)
Eosinophils Relative: 3.3 % (ref 0.0–5.0)
HCT: 39.1 % (ref 36.0–46.0)
Hemoglobin: 13 g/dL (ref 12.0–15.0)
Lymphocytes Relative: 32.6 % (ref 12.0–46.0)
Lymphs Abs: 3.2 K/uL (ref 0.7–4.0)
MCHC: 33.3 g/dL (ref 30.0–36.0)
MCV: 89.7 fl (ref 78.0–100.0)
Monocytes Absolute: 0.8 K/uL (ref 0.1–1.0)
Monocytes Relative: 7.8 % (ref 3.0–12.0)
Neutro Abs: 5.4 K/uL (ref 1.4–7.7)
Neutrophils Relative %: 55.7 % (ref 43.0–77.0)
Platelets: 293 K/uL (ref 150.0–400.0)
RBC: 4.36 Mil/uL (ref 3.87–5.11)
RDW: 13.9 % (ref 11.5–15.5)
WBC: 9.7 K/uL (ref 4.0–10.5)

## 2024-04-12 LAB — COMPREHENSIVE METABOLIC PANEL WITH GFR
ALT: 10 U/L (ref 3–35)
AST: 12 U/L (ref 5–37)
Albumin: 3.9 g/dL (ref 3.5–5.2)
Alkaline Phosphatase: 89 U/L (ref 39–117)
BUN: 17 mg/dL (ref 6–23)
CO2: 27 meq/L (ref 19–32)
Calcium: 9.6 mg/dL (ref 8.4–10.5)
Chloride: 107 meq/L (ref 96–112)
Creatinine, Ser: 0.61 mg/dL (ref 0.40–1.20)
GFR: 93.94 mL/min
Glucose, Bld: 97 mg/dL (ref 70–99)
Potassium: 3.3 meq/L — ABNORMAL LOW (ref 3.5–5.1)
Sodium: 141 meq/L (ref 135–145)
Total Bilirubin: 0.2 mg/dL (ref 0.2–1.2)
Total Protein: 6.6 g/dL (ref 6.0–8.3)

## 2024-04-12 MED ORDER — LOSARTAN POTASSIUM 25 MG PO TABS: 25.0000 mg | ORAL_TABLET | Freq: Every day | ORAL | 0 refills | Status: AC

## 2024-04-12 MED ORDER — POTASSIUM CHLORIDE CRYS ER 20 MEQ PO TBCR: 20.0000 meq | EXTENDED_RELEASE_TABLET | Freq: Every day | ORAL | 0 refills | Status: DC

## 2024-04-12 NOTE — Progress Notes (Signed)
 "  Subjective:     Patient ID: [name], female    DOB: Aug 23, 1960, 64 y.o.   MRN: [id number]  Chief Complaint  Patient presents with   Blood Pressure Check    Went to OBGYN and BP was 189/86 and was told to f/u w primary care. States she has had a headache since yesterday, concerned bc last time this happened she had a stroke    HPI  Discussed the use of AI scribe software for clinical note transcription with the patient, who gave verbal consent to proceed.  History of Present Illness [name] is a 65 year old female with a history of stroke who presents with elevated blood pressure and headache.  Headache and associated symptoms - Severe headache onset yesterday, improved with lying down - Accompanied by pounding heartbeat sound in ears - No numbness, weakness, or vision changes - headache has improved, now bilateral frontal   Elevated blood pressure - Elevated blood pressure noted at OBGYN earlier today: 189/86 mmHg - Blood pressure last night: 153/77 mmHg - Blood pressure today: 154 systolic, diastolic unknown - No prior history of hypertension before stroke in 2025  Cerebrovascular disease - Stroke in June 2025 due to small vein clot in inner left brain - Treated with blood thinners for 15 days, currently on daily aspirin  - Initial stroke symptoms included severe headache, inability to lift arm, and facial droop  Lifestyle modifications - Reduced smoking - Avoids added salt - Concerned about weight - Returned to work as conservation officer, 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     Health Maintenance Due  Topic Date Due   Pneumococcal Vaccine: 50+ Years (1 of 2 - PCV) Never done   Zoster Vaccines- Shingrix (1 of 2) Never done   Cervical Cancer Screening (HPV/Pap Cotest)  06/20/2017   DTaP/Tdap/Td (2 - Td or Tdap) 06/30/2022   COVID-19 Vaccine (1 - 2025-26 season) Never done   Bone Density Scan  Never done    Past Medical History:  Diagnosis  Date   Anxiety    Arthritis    Back pain    Bipolar affective disorder (HCC)    Depression    Genital herpes    History of chicken pox    Hypothyroidism     Past Surgical History:  Procedure Laterality Date   CESAREAN SECTION  1993   COLONOSCOPY  2004   neg   HEMORRHOID SURGERY  2006   LAPAROSCOPIC SALPINGO OOPHERECTOMY Left 12/19/2014   Procedure: LAPAROSCOPIC LEFT SALPINGO OOPHORECTOMY;  Surgeon: Krystal Deaner, MD;  Location: [facility];  Service: Gynecology;  Laterality: Left;   LAPAROSCOPY N/A 12/19/2014   Procedure: LAPAROSCOPY OPERATIVE;  Surgeon: Krystal Deaner, MD;  Location: [facility];  Service: Gynecology;  Laterality: N/A;   TONSILLECTOMY  1981   TUBAL LIGATION  1994    Family History  Problem Relation Age of Onset   Arthritis Mother    Alcohol abuse Sister    Cancer Neg Hx    Diabetes Neg Hx    Stroke Neg Hx    Heart disease Neg Hx     Social History   Socioeconomic History   Marital status: Divorced    Spouse name: Not on file   Number of children: 1   Years of education: 12   Highest education level: Not on file  Occupational History   Occupation: Retired  Tobacco Use   Smoking status: Every Day    Current packs/day: 0.50  Average packs/day: 0.5 packs/day for 33.0 years (16.5 ttl pk-yrs)    Types: Cigarettes   Smokeless tobacco: Never   Tobacco comments:    smoked 1983- present , up to 1/3 ppd  Vaping Use   Vaping status: Never Used  Substance and Sexual Activity   Alcohol use: Yes   Drug use: No   Sexual activity: Yes  Other Topics Concern   Not on file  Social History Narrative   Regular exercise-noCaffeine Use-yes      Lives alone with 2 dogs.   Social Drivers of Health   Tobacco Use: High Risk (02/09/2024)   Patient History    Smoking Tobacco Use: Every Day    Smokeless Tobacco Use: Never    Passive Exposure: Not on file  Financial Resource Strain: Medium Risk (06/23/2023)   Overall Financial Resource Strain (CARDIA)    Difficulty  of Paying Living Expenses: Somewhat hard  Food Insecurity: No Food Insecurity (09/02/2023)   Epic    Worried About Programme Researcher, Broadcasting/film/video in the Last Year: Never true    Ran Out of Food in the Last Year: Never true  Transportation Needs: No Transportation Needs (09/02/2023)   Epic    Lack of Transportation (Medical): No    Lack of Transportation (Non-Medical): No  Physical Activity: Inactive (06/23/2023)   Exercise Vital Sign    Days of Exercise per Week: 0 days    Minutes of Exercise per Session: 0 min  Stress: Stress Concern Present (06/23/2023)   [employer] of Occupational Health - Occupational Stress Questionnaire    Feeling of Stress : Rather much  Social Connections: Moderately Integrated (06/23/2023)   Social Connection and Isolation Panel    Frequency of Communication with Friends and Family: More than three times a week    Frequency of Social Gatherings with Friends and Family: More than three times a week    Attends Religious Services: More than 4 times per year    Active Member of [employer] or Organizations: Yes    Attends Banker Meetings: Never    Marital Status: Divorced  Catering Manager Violence: Patient Unable To Answer (06/23/2023)   Humiliation, Afraid, Rape, and Kick questionnaire    Fear of Current or Ex-Partner: Patient unable to answer    Emotionally Abused: Patient unable to answer    Physically Abused: Patient unable to answer    Sexually Abused: Patient unable to answer  Depression (PHQ2-9): Low Risk (06/23/2023)   Depression (PHQ2-9)    PHQ-2 Score: 3  Alcohol Screen: Low Risk (06/23/2023)   Alcohol Screen    Last Alcohol Screening Score (AUDIT): 0  Housing: Unknown (09/02/2023)   Epic    Unable to Pay for Housing in the Last Year: No    Number of Times Moved in the Last Year: Not on file    Homeless in the Last Year: No  Utilities: Not At Risk (09/02/2023)   Epic    Threatened with loss of utilities: No  Health Literacy: Adequate Health Literacy  (06/23/2023)   B1300 Health Literacy    Frequency of need for help with medical instructions: Never    Outpatient Medications Prior to Visit  Medication Sig Dispense Refill   aspirin  EC 81 MG tablet Take 1 tablet (81 mg total) by mouth daily. Swallow whole. 30 tablet 12   Oregano-Flaxseed Oil (OREGANO OIL-FLAXSEED OIL PO) Take 1 capsule by mouth daily.     Turmeric (QC TUMERIC COMPLEX PO) Take by mouth.  ascorbic acid (VITAMIN C) 500 MG tablet Take 500 mg by mouth daily. (Patient not taking: Reported on 04/12/2024)     atorvastatin  (LIPITOR ) 80 MG tablet Take 1 tablet (80 mg total) by mouth daily. (Patient not taking: Reported on 04/12/2024) 90 tablet 3   cholecalciferol (VITAMIN D3) 25 MCG (1000 UNIT) tablet Take 1,000 Units by mouth daily.     diclofenac  Sodium (VOLTAREN ) 1 % GEL Apply 2 g topically 4 (four) times daily. 100 g 3   EPINEPHrine  (EPIPEN  2-PAK) 0.3 mg/0.3 mL IJ SOAJ injection Inject 0.3 mg into the muscle as needed for anaphylaxis. 1 each 0   nicotine  (NICODERM CQ  - DOSED IN MG/24 HOURS) 14 mg/24hr patch RX #1 Weeks 1-6: 14 mg x 1 patch daily. Wear for 24 hours. If you have sleep disturbances, remove at bedtime. (Patient not taking: Reported on 04/12/2024) 42 patch 0   nicotine  (NICODERM CQ  - DOSED IN MG/24 HR) 7 mg/24hr patch RX #2 Weeks 7-8: 7 mg x 1 patch dailyWear for 24 hours. If you have sleep disturbances, remove at bedtime.. (Patient not taking: Reported on 04/12/2024) 14 patch 0   Zinc Acetate, Oral, (ZINC ACETATE PO) Take 1 tablet by mouth daily. (Patient not taking: Reported on 04/12/2024)     No facility-administered medications prior to visit.    Allergies[1]  ROS Per HPI     Objective:    Physical Exam Constitutional:      General: She is not in acute distress.    Appearance: She is not ill-appearing.  HENT:     Nose: Nose normal.     Mouth/Throat:     Mouth: Mucous membranes are moist.     Pharynx: Oropharynx is clear.  Eyes:     General: No visual  field deficit.    Extraocular Movements: Extraocular movements intact.     Conjunctiva/sclera: Conjunctivae normal.     Pupils: Pupils are equal, round, and reactive to light.  Cardiovascular:     Rate and Rhythm: Normal rate and regular rhythm.  Pulmonary:     Effort: Pulmonary effort is normal.     Breath sounds: Normal breath sounds.  Musculoskeletal:        General: Normal range of motion.     Cervical back: Normal range of motion and neck supple. No tenderness.     Right lower leg: No edema.     Left lower leg: No edema.  Lymphadenopathy:     Cervical: No cervical adenopathy.  Skin:    General: Skin is warm and dry.  Neurological:     General: No focal deficit present.     Mental Status: She is alert and oriented to person, place, and time.     Cranial Nerves: No cranial nerve deficit or facial asymmetry.     Motor: No weakness or pronator drift.     Coordination: Romberg sign negative. Coordination normal.     Gait: Gait normal.  Psychiatric:        Attention and Perception: Attention normal.        Mood and Affect: Mood normal.        Speech: Speech normal.        Behavior: Behavior normal.        Thought Content: Thought content normal.      BP (!) 162/60   Pulse 94   Temp 97.9 F (36.6 C) (Temporal)   Ht 5' 5 (1.651 m)   SpO2 96%   BMI 33.12 kg/m  Wt Readings  from Last 3 Encounters:  02/09/24 199 lb (90.3 kg)  10/06/23 195 lb (88.5 kg)  09/30/23 197 lb (89.4 kg)       Assessment & Plan:   Problem List Items Addressed This Visit     Elevated blood pressure reading - Primary   Hx of completed stroke   Tobacco use disorder    Assessment and Plan Assessment & Plan Hypertension Newly diagnosed hypertension with elevated blood pressure readings (189/86, 154/77). No prior history of hypertension. Current symptoms include headache and tinnitus, likely related to elevated blood pressure. Blood pressure has decreased since initial reading. Discussed the  importance of controlling blood pressure to prevent another stroke.  - Benign neurological exam - Started losartan  25 mg daily - Ordered CBC and CMP - Advised monitoring blood pressure regularly - follow up with PCP as scheduled next week   History of ischemic stroke Ischemic stroke in June 2010 due to a small vein clot in the left brain. Previously on blood thinners for 15 days and daily aspirin . Current symptoms do not suggest acute stroke, but hypertension management is crucial to prevent recurrence. 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  - Referred to Sports Medicine for evaluation and management of knee instability   Nicotine  dependence Current smoker. Discussed the importance of smoking cessation in managing hypertension and reducing stroke risk. - Advised smoking cessation  General health maintenance Discussed the importance of lifestyle modifications such as weight loss and salt intake reduction to manage hypertension. Encouraged continued monitoring of blood pressure and cholesterol levels. - Advised lifestyle modifications including weight loss and reduced salt intake - Encouraged regular monitoring of blood pressure and cholesterol levels     I am having [name] start on losartan . I am also having her maintain her EPINEPHrine , diclofenac  Sodium, Oregano-Flaxseed Oil (OREGANO OIL-FLAXSEED OIL PO), ascorbic acid, cholecalciferol, (Zinc Acetate, Oral, (ZINC ACETATE PO)), aspirin  EC, atorvastatin , nicotine , nicotine , and Turmeric (QC TUMERIC COMPLEX PO).  Meds ordered this encounter  Medications   losartan  (COZAAR ) 25 MG tablet    Sig: Take 1 tablet (25 mg total) by mouth daily.    Dispense:  90 tablet    Refill:  0    Supervising Provider:   ROLLENE NORRIS A [[id number]]       [1]  Allergies Allergen Reactions   Bee Venom Anaphylaxis    "

## 2024-04-12 NOTE — Progress Notes (Signed)
 Please let her know or see that she read her message about her low potassium. Thanks.

## 2024-04-12 NOTE — Telephone Encounter (Signed)
 FYI Only or Action Required?: FYI only for provider: appointment scheduled on 04/12/24.  Patient was last seen in primary care on 02/09/2024 by Rollene Almarie LABOR, MD.  Called Nurse Triage reporting Hypertension.  Symptoms began yesterday.  Interventions attempted: OTC medications: ibuprofen and Prescription medications: ASA.  Symptoms are: unchanged.  Triage Disposition: See Physician Within 24 Hours  Patient/caregiver understands and will follow disposition?: Yes     Message from Taleah C sent at 04/12/2024 10:38 AM EST  Reason for Triage: high blood pressure at 186/89, headaches since yesterday, hxs of strokes     Reason for Disposition  Systolic BP >= 180 OR Diastolic >= 110  Answer Assessment - Initial Assessment Questions Pt called to report h/a since last night with elevated BP this morning. Pt states BP last night was 153/77 then this morning, 186/89. Pt does have h/a but taking ibuprofen. Pt has taken ASA today. Pt denies any changes in vision, no facial drooping, no numbness in arms, h/a is not a crushing h/a. Pt states she did have a stroke in the past with h/a, elevated BP, facial drooping and numbness in arm. Pt did call EMS and was admitted for 24h observation. Pt states symptoms are not the same as today as when stroke event occurred. Based on hx encouraged evaluation today even to monitor BP. Pt agreeable to see PCP or V. Henson. Appointment scheduled for evaluation. Patient agrees with plan of care, and will call back if anything changes, or if symptoms worsen.       1. BLOOD PRESSURE: What is your blood pressure? Did you take at least two measurements 5 minutes apart?     186/89 this morning; 153/77 last night  2. ONSET: When did you take your blood pressure?     Last night before bed and this morning   3. HOW: How did you take your blood pressure? (e.g., automatic home BP monitor, visiting nurse)     Automatic home BP monitor   4. HISTORY: Do  you have a history of high blood pressure?     No; pt has had elevated BP with previous stroke  5. MEDICINES: Are you taking any medicines for blood pressure? Have you missed any doses recently?     No; only takes ASA daily   6. OTHER SYMPTOMS: Do you have any symptoms? (e.g., blurred vision, chest pain, difficulty breathing, headache, weakness)     H/a  Protocols used: Blood Pressure - High-A-AH

## 2024-04-12 NOTE — Patient Instructions (Addendum)
 Please start losartan  20 mg daily and monitor your blood pressure at home.   Follow up with your primary care provider next week as scheduled.

## 2024-04-19 ENCOUNTER — Encounter: Admitting: Internal Medicine

## 2024-04-19 ENCOUNTER — Ambulatory Visit: Admitting: Family Medicine

## 2024-04-25 ENCOUNTER — Encounter: Admitting: Internal Medicine

## 2024-04-26 ENCOUNTER — Ambulatory Visit: Admitting: Internal Medicine

## 2024-04-26 ENCOUNTER — Encounter: Payer: Self-pay | Admitting: Internal Medicine

## 2024-04-26 VITALS — BP 134/62 | HR 102 | Temp 98.2°F | Ht 65.0 in | Wt 198.0 lb

## 2024-04-26 DIAGNOSIS — Z8673 Personal history of transient ischemic attack (TIA), and cerebral infarction without residual deficits: Secondary | ICD-10-CM

## 2024-04-26 DIAGNOSIS — F17200 Nicotine dependence, unspecified, uncomplicated: Secondary | ICD-10-CM

## 2024-04-26 DIAGNOSIS — E039 Hypothyroidism, unspecified: Secondary | ICD-10-CM

## 2024-04-26 DIAGNOSIS — F319 Bipolar disorder, unspecified: Secondary | ICD-10-CM

## 2024-04-26 DIAGNOSIS — E2839 Other primary ovarian failure: Secondary | ICD-10-CM

## 2024-04-26 DIAGNOSIS — I10 Essential (primary) hypertension: Secondary | ICD-10-CM

## 2024-04-26 DIAGNOSIS — Z0000 Encounter for general adult medical examination without abnormal findings: Secondary | ICD-10-CM

## 2024-04-26 DIAGNOSIS — R7303 Prediabetes: Secondary | ICD-10-CM

## 2024-04-26 LAB — COMPREHENSIVE METABOLIC PANEL WITH GFR
ALT: 10 U/L (ref 3–35)
AST: 14 U/L (ref 5–37)
Albumin: 4.2 g/dL (ref 3.5–5.2)
Alkaline Phosphatase: 88 U/L (ref 39–117)
BUN: 18 mg/dL (ref 6–23)
CO2: 25 meq/L (ref 19–32)
Calcium: 9.5 mg/dL (ref 8.4–10.5)
Chloride: 106 meq/L (ref 96–112)
Creatinine, Ser: 0.7 mg/dL (ref 0.40–1.20)
GFR: 90.85 mL/min
Glucose, Bld: 105 mg/dL — ABNORMAL HIGH (ref 70–99)
Potassium: 3.7 meq/L (ref 3.5–5.1)
Sodium: 142 meq/L (ref 135–145)
Total Bilirubin: 0.3 mg/dL (ref 0.2–1.2)
Total Protein: 6.9 g/dL (ref 6.0–8.3)

## 2024-04-26 LAB — TSH: TSH: 1.54 u[IU]/mL (ref 0.35–5.50)

## 2024-04-26 NOTE — Assessment & Plan Note (Addendum)
 Flu shot declines. Pneumonia declines. Shingrix declines. Tetanus declines. Cologuard up to date. Mammogram up to date with gyn, pap smear up to date with gyn and dexa ordered. Counseled about sun safety and mole surveillance. Counseled about the dangers of distracted driving. Given 10 year screening recommendations.

## 2024-04-26 NOTE — Assessment & Plan Note (Signed)
 Checking TSH and adjust as needed she is not on meds currently.

## 2024-04-26 NOTE — Assessment & Plan Note (Signed)
 Some anxiety but declines need for medication at this time. Not in manic or depressed mood currently but some lack of purpose. She is working to reconnect with purpose. Continue monitoring. Past manic episodes.

## 2024-04-26 NOTE — Assessment & Plan Note (Signed)
 Is cutting back no plans to quit at this time. Counseled on risk/harm from smoking.

## 2024-04-26 NOTE — Assessment & Plan Note (Signed)
 Up to date on HgA1c will monitor every 6 months.

## 2024-04-26 NOTE — Assessment & Plan Note (Signed)
 BP at goal on losartan  25 mg daily. Checking CMP today as this was recently started.

## 2024-04-26 NOTE — Progress Notes (Signed)
" ° °  Subjective:   Patient ID: [name], female    DOB: Nov 28, 1960, 64 y.o.   MRN: [id number]  The patient is here for physical. Pertinent topics discussed: Discussed the use of AI scribe software for clinical note transcription with the patient, who gave verbal consent to proceed. History of Present Illness [name] is a 64 year old female who presents for follow-up of her blood pressure management.  Her blood pressure was noted to be high during a gynecologist visit two to three weeks ago, leading to the initiation of a low-dose antihypertensive medication. Since starting the medication, she no longer hears her heartbeat, although she occasionally experiences tinnitus.  She has reduced her smoking but has not quit entirely. She has not consumed alcohol in over two years.  Her left knee has been problematic, frequently popping out of place, causing significant discomfort. She had to cancel a previous appointment with sports medicine due to weather but is scheduled to see her next week. She previously used a knee brace that helped stabilize her kneecap but has misplaced it during a move.  Emotionally, she describes some days as challenging, particularly around the anniversary of her mother's passing. She has taken time off work to cope with these difficult days.  PMH, [facility], social history reviewed and updated  Review of Systems  Constitutional: Negative.   HENT: Negative.    Eyes: Negative.   Respiratory:  Negative for cough, chest tightness and shortness of breath.   Cardiovascular:  Negative for chest pain, palpitations and leg swelling.  Gastrointestinal:  Negative for abdominal distention, abdominal pain, constipation, diarrhea, nausea and vomiting.  Musculoskeletal: Negative.   Skin: Negative.   Neurological:  Positive for headaches.  Psychiatric/Behavioral: Negative.      Objective:  Physical Exam Constitutional:      Appearance: She is well-developed.   HENT:     Head: Normocephalic and atraumatic.  Cardiovascular:     Rate and Rhythm: Normal rate and regular rhythm.  Pulmonary:     Effort: Pulmonary effort is normal. No respiratory distress.     Breath sounds: Normal breath sounds. No wheezing or rales.  Abdominal:     General: Bowel sounds are normal. There is no distension.     Palpations: Abdomen is soft.     Tenderness: There is no abdominal tenderness.  Musculoskeletal:     Cervical back: Normal range of motion.  Skin:    General: Skin is warm and dry.  Neurological:     Mental Status: She is alert and oriented to person, place, and time.     Coordination: Coordination normal.     Vitals:   04/26/24 0829  BP: 134/62  Pulse: (!) 102  Temp: 98.2 F (36.8 C)  TempSrc: Oral  SpO2: 97%  Weight: 198 lb (89.8 kg)  Height: 5' 5 (1.651 m)    Assessment & Plan:   "

## 2024-04-26 NOTE — Assessment & Plan Note (Signed)
 Recent lipid and HgA1c. BP borderline today will monitor as she just started losartan  within 2 weeks ago. No new stroke symptoms and taking aspirin  81 mg daily will continue. She has stopped statin and does not want to resume although we have previously discussed that this is lowering her risk of future stroke.

## 2024-04-27 ENCOUNTER — Telehealth: Payer: Self-pay

## 2024-04-27 NOTE — Telephone Encounter (Signed)
 Copied from CRM [id number]. Topic: Clinical - Medical Advice >> Apr 27, 2024 10:38 AM Charolett L wrote: Reason for CRM: Patient called in and stated that her bone density appt is already scheduled for 02/18

## 2024-04-28 ENCOUNTER — Ambulatory Visit: Payer: Self-pay | Admitting: Internal Medicine

## 2024-05-03 ENCOUNTER — Ambulatory Visit: Admitting: Family Medicine

## 2024-05-10 ENCOUNTER — Other Ambulatory Visit

## 2024-05-31 ENCOUNTER — Ambulatory Visit: Admitting: Adult Health

## 2024-06-23 ENCOUNTER — Ambulatory Visit

## 2024-10-25 ENCOUNTER — Ambulatory Visit: Admitting: Internal Medicine
